# Patient Record
Sex: Female | Born: 1970
Health system: Southern US, Community
[De-identification: ages and names within clinical notes are randomized; demographics above are authoritative.]

## PROBLEM LIST (undated history)

## (undated) DIAGNOSIS — I10 Essential (primary) hypertension: Secondary | ICD-10-CM

## (undated) DIAGNOSIS — E669 Obesity, unspecified: Secondary | ICD-10-CM

## (undated) DIAGNOSIS — D649 Anemia, unspecified: Secondary | ICD-10-CM

## (undated) DIAGNOSIS — R9431 Abnormal electrocardiogram [ECG] [EKG]: Secondary | ICD-10-CM

## (undated) DIAGNOSIS — K219 Gastro-esophageal reflux disease without esophagitis: Secondary | ICD-10-CM

## (undated) DIAGNOSIS — F32A Depression, unspecified: Secondary | ICD-10-CM

## (undated) DIAGNOSIS — E559 Vitamin D deficiency, unspecified: Secondary | ICD-10-CM

## (undated) DIAGNOSIS — G43909 Migraine, unspecified, not intractable, without status migrainosus: Secondary | ICD-10-CM

## (undated) DIAGNOSIS — E785 Hyperlipidemia, unspecified: Secondary | ICD-10-CM

## (undated) DIAGNOSIS — E8881 Metabolic syndrome: Secondary | ICD-10-CM

## (undated) DIAGNOSIS — E119 Type 2 diabetes mellitus without complications: Secondary | ICD-10-CM

## (undated) DIAGNOSIS — A048 Other specified bacterial intestinal infections: Secondary | ICD-10-CM

## (undated) DIAGNOSIS — F329 Major depressive disorder, single episode, unspecified: Secondary | ICD-10-CM

## (undated) DIAGNOSIS — R946 Abnormal results of thyroid function studies: Secondary | ICD-10-CM

## (undated) DIAGNOSIS — A6000 Herpesviral infection of urogenital system, unspecified: Secondary | ICD-10-CM

## (undated) HISTORY — DX: Type 2 diabetes mellitus without complications: E11.9

## (undated) HISTORY — DX: Major depressive disorder, single episode, unspecified: F32.9

## (undated) HISTORY — DX: Gastro-esophageal reflux disease without esophagitis: K21.9

## (undated) HISTORY — DX: Metabolic syndrome: E88.81

## (undated) HISTORY — DX: Herpesviral infection of urogenital system, unspecified: A60.00

## (undated) HISTORY — DX: Metabolic syndrome: E88.810

## (undated) HISTORY — DX: Abnormal results of thyroid function studies: R94.6

## (undated) HISTORY — PX: TUBAL LIGATION: SHX77

## (undated) HISTORY — DX: Migraine, unspecified, not intractable, without status migrainosus: G43.909

## (undated) HISTORY — PX: ABDOMINAL HYSTERECTOMY: SHX81

## (undated) HISTORY — PX: ANKLE SURGERY: SHX546

## (undated) HISTORY — DX: Essential (primary) hypertension: I10

## (undated) HISTORY — DX: Abnormal electrocardiogram (ECG) (EKG): R94.31

## (undated) HISTORY — DX: Other specified bacterial intestinal infections: A04.8

## (undated) HISTORY — DX: Depression, unspecified: F32.A

## (undated) HISTORY — DX: Obesity, unspecified: E66.9

## (undated) HISTORY — DX: Hyperlipidemia, unspecified: E78.5

## (undated) HISTORY — DX: Vitamin D deficiency, unspecified: E55.9

## (undated) HISTORY — DX: Anemia, unspecified: D64.9

---

## 2004-08-08 ENCOUNTER — Emergency Department: Payer: Self-pay | Admitting: Emergency Medicine

## 2005-01-28 ENCOUNTER — Emergency Department: Payer: Self-pay | Admitting: Emergency Medicine

## 2005-04-16 ENCOUNTER — Emergency Department: Payer: Self-pay | Admitting: Emergency Medicine

## 2005-05-10 ENCOUNTER — Emergency Department: Payer: Self-pay | Admitting: Emergency Medicine

## 2005-05-29 ENCOUNTER — Emergency Department: Payer: Self-pay | Admitting: Emergency Medicine

## 2006-01-10 ENCOUNTER — Emergency Department: Payer: Self-pay | Admitting: Emergency Medicine

## 2006-01-23 ENCOUNTER — Emergency Department: Payer: Self-pay | Admitting: Internal Medicine

## 2006-03-05 ENCOUNTER — Emergency Department: Payer: Self-pay | Admitting: Emergency Medicine

## 2006-07-15 ENCOUNTER — Emergency Department: Payer: Self-pay

## 2007-05-21 ENCOUNTER — Ambulatory Visit: Payer: Self-pay | Admitting: Internal Medicine

## 2007-05-31 ENCOUNTER — Emergency Department: Payer: Self-pay | Admitting: Internal Medicine

## 2007-05-31 ENCOUNTER — Other Ambulatory Visit: Payer: Self-pay

## 2007-06-05 ENCOUNTER — Emergency Department: Payer: Self-pay | Admitting: Emergency Medicine

## 2007-06-05 ENCOUNTER — Other Ambulatory Visit: Payer: Self-pay

## 2007-06-07 ENCOUNTER — Ambulatory Visit: Payer: Self-pay | Admitting: Internal Medicine

## 2007-06-08 ENCOUNTER — Emergency Department: Payer: Self-pay | Admitting: Emergency Medicine

## 2007-06-20 ENCOUNTER — Ambulatory Visit: Payer: Self-pay | Admitting: Internal Medicine

## 2007-07-21 ENCOUNTER — Ambulatory Visit: Payer: Self-pay | Admitting: Internal Medicine

## 2007-08-20 ENCOUNTER — Ambulatory Visit: Payer: Self-pay | Admitting: Internal Medicine

## 2007-09-20 ENCOUNTER — Ambulatory Visit: Payer: Self-pay | Admitting: Internal Medicine

## 2007-10-21 ENCOUNTER — Ambulatory Visit: Payer: Self-pay | Admitting: Internal Medicine

## 2007-11-08 ENCOUNTER — Ambulatory Visit: Payer: Self-pay | Admitting: Obstetrics & Gynecology

## 2007-11-18 ENCOUNTER — Ambulatory Visit: Payer: Self-pay | Admitting: Internal Medicine

## 2007-11-20 ENCOUNTER — Ambulatory Visit: Payer: Self-pay | Admitting: Obstetrics & Gynecology

## 2007-12-19 ENCOUNTER — Ambulatory Visit: Payer: Self-pay | Admitting: Internal Medicine

## 2008-01-09 ENCOUNTER — Ambulatory Visit: Payer: Self-pay | Admitting: Obstetrics & Gynecology

## 2008-01-15 ENCOUNTER — Ambulatory Visit: Payer: Self-pay | Admitting: Obstetrics & Gynecology

## 2008-01-18 ENCOUNTER — Ambulatory Visit: Payer: Self-pay | Admitting: Internal Medicine

## 2008-02-01 ENCOUNTER — Ambulatory Visit: Payer: Self-pay | Admitting: Internal Medicine

## 2008-02-08 ENCOUNTER — Emergency Department: Payer: Self-pay | Admitting: Emergency Medicine

## 2008-02-18 ENCOUNTER — Ambulatory Visit: Payer: Self-pay | Admitting: Internal Medicine

## 2009-02-21 ENCOUNTER — Emergency Department: Payer: Self-pay | Admitting: Emergency Medicine

## 2009-03-24 ENCOUNTER — Ambulatory Visit: Payer: Self-pay | Admitting: Family Medicine

## 2009-05-18 ENCOUNTER — Emergency Department: Payer: Self-pay | Admitting: Emergency Medicine

## 2009-08-17 ENCOUNTER — Ambulatory Visit: Payer: Self-pay | Admitting: Family Medicine

## 2010-02-12 ENCOUNTER — Emergency Department: Payer: Self-pay | Admitting: Emergency Medicine

## 2010-02-14 ENCOUNTER — Emergency Department: Payer: Self-pay | Admitting: Emergency Medicine

## 2010-03-08 LAB — HM PAP SMEAR: HM Pap smear: NORMAL

## 2010-09-30 ENCOUNTER — Emergency Department: Payer: Self-pay | Admitting: Emergency Medicine

## 2011-03-11 ENCOUNTER — Ambulatory Visit: Payer: Self-pay | Admitting: Family Medicine

## 2011-04-09 ENCOUNTER — Emergency Department: Payer: Self-pay | Admitting: Emergency Medicine

## 2011-06-19 ENCOUNTER — Emergency Department: Payer: Self-pay | Admitting: *Deleted

## 2011-09-08 ENCOUNTER — Emergency Department: Payer: Self-pay | Admitting: Emergency Medicine

## 2011-12-29 ENCOUNTER — Observation Stay: Payer: Self-pay | Admitting: Internal Medicine

## 2011-12-29 DIAGNOSIS — R079 Chest pain, unspecified: Secondary | ICD-10-CM

## 2011-12-29 LAB — BASIC METABOLIC PANEL
Anion Gap: 9 (ref 7–16)
BUN: 10 mg/dL (ref 7–18)
EGFR (Non-African Amer.): >60
Glucose: 96 mg/dL (ref 65–99)
Osmolality: 276 (ref 275–301)

## 2011-12-29 LAB — CK TOTAL AND CKMB (NOT AT ARMC)
CK, Total: 102 U/L (ref 21–215)
CK, Total: 89 U/L (ref 21–215)
CK-MB: <0.5 ng/mL — ABNORMAL LOW (ref 0.5–3.6)
CK-MB: <0.5 ng/mL — ABNORMAL LOW (ref 0.5–3.6)

## 2011-12-29 LAB — TROPONIN I: Troponin-I: <0.02 ng/mL

## 2011-12-29 LAB — CBC
HGB: 13.2 g/dL (ref 12.0–16.0)
MCH: 29 pg (ref 26.0–34.0)
MCHC: 33.3 g/dL (ref 32.0–36.0)
MCV: 87 fL (ref 80–100)
Platelet: 284 10*3/uL (ref 150–440)
RBC: 4.55 10*6/uL (ref 3.80–5.20)
RDW: 14.8 % — ABNORMAL HIGH (ref 11.5–14.5)

## 2011-12-30 LAB — BASIC METABOLIC PANEL
Anion Gap: 7 (ref 7–16)
BUN: 15 mg/dL (ref 7–18)
Calcium, Total: 8.6 mg/dL (ref 8.5–10.1)
Chloride: 104 mmol/L (ref 98–107)
Co2: 27 mmol/L (ref 21–32)
EGFR (African American): >60
Glucose: 111 mg/dL — ABNORMAL HIGH (ref 65–99)
Potassium: 3.7 mmol/L (ref 3.5–5.1)
Sodium: 138 mmol/L (ref 136–145)

## 2011-12-30 LAB — LIPID PANEL: Triglycerides: 86 mg/dL (ref 0–200)

## 2011-12-30 LAB — HEMOGLOBIN A1C: Hemoglobin A1C: 5.9 % (ref 4.2–6.3)

## 2011-12-30 LAB — TSH: Thyroid Stimulating Horm: 2.61 u[IU]/mL

## 2012-06-10 ENCOUNTER — Emergency Department: Payer: Self-pay | Admitting: Emergency Medicine

## 2012-07-09 ENCOUNTER — Ambulatory Visit: Payer: 59 | Admitting: Cardiovascular Disease

## 2012-07-16 ENCOUNTER — Encounter: Payer: Self-pay | Admitting: Cardiovascular Disease

## 2012-10-02 ENCOUNTER — Emergency Department: Payer: Self-pay | Admitting: Emergency Medicine

## 2012-10-02 LAB — URINALYSIS, COMPLETE
Bacteria: NONE SEEN
Blood: NEGATIVE
Glucose,UR: NEGATIVE mg/dL (ref 0–75)
Nitrite: NEGATIVE
Ph: 6 (ref 4.5–8.0)
Protein: NEGATIVE
RBC,UR: <1 /HPF (ref 0–5)
Squamous Epithelial: 3

## 2012-10-02 LAB — BASIC METABOLIC PANEL
Anion Gap: 3 — ABNORMAL LOW (ref 7–16)
BUN: 13 mg/dL (ref 7–18)
Calcium, Total: 8.9 mg/dL (ref 8.5–10.1)
Chloride: 107 mmol/L (ref 98–107)
Co2: 29 mmol/L (ref 21–32)
Creatinine: 0.81 mg/dL (ref 0.60–1.30)
EGFR (African American): >60
EGFR (Non-African Amer.): >60
Glucose: 86 mg/dL (ref 65–99)
Osmolality: 277 (ref 275–301)
Potassium: 3.5 mmol/L (ref 3.5–5.1)
Sodium: 139 mmol/L (ref 136–145)

## 2012-10-11 ENCOUNTER — Emergency Department: Payer: Self-pay | Admitting: Emergency Medicine

## 2013-04-20 ENCOUNTER — Emergency Department: Payer: Self-pay | Admitting: Emergency Medicine

## 2013-05-27 ENCOUNTER — Emergency Department: Payer: Self-pay | Admitting: Emergency Medicine

## 2013-07-12 ENCOUNTER — Ambulatory Visit: Payer: Self-pay | Admitting: Family Medicine

## 2013-07-12 LAB — HM MAMMOGRAPHY: HM Mammogram: NORMAL

## 2013-08-23 ENCOUNTER — Ambulatory Visit: Payer: Self-pay | Admitting: Family Medicine

## 2014-01-22 ENCOUNTER — Encounter (INDEPENDENT_AMBULATORY_CARE_PROVIDER_SITE_OTHER): Payer: Self-pay

## 2014-01-22 ENCOUNTER — Encounter: Payer: Self-pay | Admitting: Cardiovascular Disease

## 2014-01-22 ENCOUNTER — Ambulatory Visit (INDEPENDENT_AMBULATORY_CARE_PROVIDER_SITE_OTHER): Payer: BC Managed Care – PPO | Admitting: Cardiovascular Disease

## 2014-01-22 VITALS — BP 182/110 | Ht 67.0 in | Wt 294.0 lb

## 2014-01-22 DIAGNOSIS — R079 Chest pain, unspecified: Secondary | ICD-10-CM | POA: Insufficient documentation

## 2014-01-22 DIAGNOSIS — R931 Abnormal findings on diagnostic imaging of heart and coronary circulation: Secondary | ICD-10-CM | POA: Insufficient documentation

## 2014-01-22 DIAGNOSIS — R9439 Abnormal result of other cardiovascular function study: Secondary | ICD-10-CM

## 2014-01-22 DIAGNOSIS — I10 Essential (primary) hypertension: Secondary | ICD-10-CM

## 2014-01-22 DIAGNOSIS — R0602 Shortness of breath: Secondary | ICD-10-CM | POA: Insufficient documentation

## 2014-01-22 DIAGNOSIS — E119 Type 2 diabetes mellitus without complications: Secondary | ICD-10-CM

## 2014-01-22 DIAGNOSIS — E785 Hyperlipidemia, unspecified: Secondary | ICD-10-CM

## 2014-01-22 DIAGNOSIS — E1169 Type 2 diabetes mellitus with other specified complication: Secondary | ICD-10-CM

## 2014-01-22 DIAGNOSIS — E669 Obesity, unspecified: Secondary | ICD-10-CM | POA: Insufficient documentation

## 2014-01-22 MED ORDER — CLONIDINE HCL 0.1 MG PO TABS: 0.1000 mg | ORAL_TABLET | Freq: Two times a day (BID) | ORAL | Status: DC

## 2014-01-22 NOTE — Patient Instructions (Signed)
You are doing well. Stay on the tribenzor Start clonidine one pill twice a day Stay on nadolol twice a day  If blood pressure continue to run high after one week, Increase the clonidine up to 0.2 mg twice a day  Please call us if you have new issues that need to be addressed before your next appt.  Your physician wants you to follow-up in: 2 weeks

## 2014-01-22 NOTE — Progress Notes (Signed)
Patient ID: Tiffany Nunez, female    DOB: 10-23-1970, 43 y.o.   MRN: [id number]  HPI Comments: [name] is a very pleasant 43 year old woman with obesity, hypertension, GERD, hyperlipidemia, diabetes type 2 who presents by referral for evaluation of hypertension.  Notes indicate history of prior stress test 12/29/2011 showing no ischemia. Ejection fraction at that time estimated at 36% She has not had an echocardiogram to confirm ejection fraction Reports that her sugars have been relatively well controlled. She is concerned about her poorly controlled hypertension. She was recently started on hydralazine 10 mg 3 times a day. She thinks that she will have trouble with a 3 times a day medication, forgetting doses.  In her blood pressure is high, she has headaches, sometimes these can lead to migraines When asked about chest pain as mentioned in the note from Dr. Ancil Boozer, she denies having further chest pain. She is nervous about her blood pressure and feels that this may contribute to symptoms.  She reports that higher dose amlodipine caused leg swelling. She continues to have mild leg swelling on amlodipine 5 mg daily No significant symptoms on at all. She has not noticed any symptoms so far on hydralazine but she is only been on this for one or 2 days.  EKG shows normal sinus rhythm with rate 63 beats per minute, no significant ST or T wave changes   Outpatient Encounter Prescriptions as of 01/22/2014  Medication Sig  . hydrALAZINE (APRESOLINE) 10 MG tablet Take 10 mg by mouth 3 (three) times daily.  Marland Kitchen ibuprofen (ADVIL,MOTRIN) 800 MG tablet Take 800 mg by mouth every 8 (eight) hours as needed.  . metFORMIN (GLUCOPHAGE) 500 MG tablet Take 500 mg by mouth daily with breakfast.  . nadolol (CORGARD) 40 MG tablet Take 40 mg by mouth 2 (two) times daily.  . Olmesartan-Amlodipine-HCTZ (TRIBENZOR) 40-5-25 MG TABS Take by mouth daily.  . ondansetron (ZOFRAN) 4 MG tablet Take 4 mg by mouth 2 (two)  times daily as needed for nausea or vomiting.     Review of Systems  Constitutional: Negative.   Eyes: Negative.   Respiratory: Positive for shortness of breath.   Cardiovascular: Negative.   Gastrointestinal: Negative.   Endocrine: Negative.   Musculoskeletal: Negative.   Skin: Negative.   Allergic/Immunologic: Negative.   Neurological: Positive for headaches.  Hematological: Negative.   Psychiatric/Behavioral: Negative.   All other systems reviewed and are negative.   BP 182/110  Ht 5\' 7"  (1.702 m)  Wt 294 lb (133.358 kg)  BMI 46.04 kg/m2  Physical Exam  Nursing note and vitals reviewed. Constitutional: She is oriented to person, place, and time. She appears well-developed and well-nourished.  HENT:  Head: Normocephalic.  Nose: Nose normal.  Mouth/Throat: Oropharynx is clear and moist.  Eyes: Conjunctivae are normal. Pupils are equal, round, and reactive to light.  Neck: Normal range of motion. Neck supple. No JVD present.  Cardiovascular: Normal rate, regular rhythm, S1 normal, S2 normal, normal heart sounds and intact distal pulses.  Exam reveals no gallop and no friction rub.   No murmur heard. Pulmonary/Chest: Effort normal and breath sounds normal. No respiratory distress. She has no wheezes. She has no rales. She exhibits no tenderness.  Abdominal: Soft. Bowel sounds are normal. She exhibits no distension. There is no tenderness.  Musculoskeletal: Normal range of motion. She exhibits no edema and no tenderness.  Lymphadenopathy:    She has no cervical adenopathy.  Neurological: She is alert and oriented to person, place,  and time. Coordination normal.  Skin: Skin is warm and dry. No rash noted. No erythema.  Psychiatric: She has a normal mood and affect. Her behavior is normal. Judgment and thought content normal.    Assessment and Plan

## 2014-01-22 NOTE — Assessment & Plan Note (Signed)
Mild shortness of breath likely from deconditioning, obesity, hypertension. May need echocardiogram to exclude cardio myopathy

## 2014-01-22 NOTE — Assessment & Plan Note (Signed)
Prior stress test in 2013 showing ejection fraction 36%. Suggested after blood pressure is well controlled, we consider routine echocardiogram to rule out cardiomyopathy. Sometimes low ejection fraction can be seen during stress testing which is a false reading.

## 2014-01-22 NOTE — Assessment & Plan Note (Signed)
Encouraged her to continue to work on her strict diet, and once blood pressure is controlled, we could start an exercise program

## 2014-01-22 NOTE — Assessment & Plan Note (Signed)
We spent most of her visit today talking about blood pressure and other options for medications. She is not particularly excited about a 3 times a day medication. Also having leg edema possibly from amlodipine. For now we have suggested she start clonidine 0.1 mg twice a day. We will hold the hydralazine and use this later if needed. If blood pressure is controlled, could try to wean the amlodipine to minimize leg edema. Perhaps dose could be decreased down to 2.5 mg daily. Clonidine dosing could be increased if tolerated, other options for blood pressure medication include Cardura, isosorbide mononitrate. She is followed by the Price of the tribenzor. In the future, perhaps these could be changed to generics (diovan/HCTZ) If blood pressure continues to be difficult to control, renal  artery ultrasound will be ordered.

## 2014-01-22 NOTE — Assessment & Plan Note (Signed)
She denies any further chest pains on today's visit. We will watch her closely. Testing could be ordered if she has additional symptoms.

## 2014-01-22 NOTE — Assessment & Plan Note (Signed)
We have encouraged continued exercise, careful diet management in an effort to lose weight. 

## 2014-01-30 ENCOUNTER — Ambulatory Visit: Payer: 59 | Admitting: Cardiovascular Disease

## 2014-01-31 ENCOUNTER — Telehealth: Payer: Self-pay

## 2014-01-31 NOTE — Telephone Encounter (Signed)
Spoke w/ pt.  She reports that her BP is not going down, staying around 150/100. States that she usually has ankle swelling, but this has always been relieved after elevating her feet. States that her feet have been swollen every morning. Reports that her SOB is improving and she is able to move around more. Advised her that per her instructions on her last ov, she was to increase her clonidine to 0.2mg  BID if BP remained elevated.  She is agreeable to this and will increase her dosage today.  Discussed w/ pt her diet.  She reports that she has been eating lots of soups and salads w/ ranch dressing. Advised her to follow a low sodium diet and see if this also helps w/ sx.  Pt will call w/ further questions or concerns.

## 2014-01-31 NOTE — Telephone Encounter (Signed)
Pt called, started taking Clonidine, states BP has not gone down over 150/100, ankles and staying swollen, not going down overnight.

## 2014-02-06 ENCOUNTER — Encounter: Payer: Self-pay | Admitting: Cardiovascular Disease

## 2014-02-06 ENCOUNTER — Ambulatory Visit (INDEPENDENT_AMBULATORY_CARE_PROVIDER_SITE_OTHER): Payer: BC Managed Care – PPO | Admitting: Cardiovascular Disease

## 2014-02-06 VITALS — BP 140/98 | HR 68 | Ht 67.0 in | Wt 295.2 lb

## 2014-02-06 DIAGNOSIS — E669 Obesity, unspecified: Secondary | ICD-10-CM

## 2014-02-06 DIAGNOSIS — I10 Essential (primary) hypertension: Secondary | ICD-10-CM

## 2014-02-06 DIAGNOSIS — E1169 Type 2 diabetes mellitus with other specified complication: Secondary | ICD-10-CM

## 2014-02-06 DIAGNOSIS — E119 Type 2 diabetes mellitus without complications: Secondary | ICD-10-CM

## 2014-02-06 MED ORDER — LOSARTAN POTASSIUM-HCTZ 100-25 MG PO TABS: 1.0000 | ORAL_TABLET | Freq: Every day | ORAL | Status: DC

## 2014-02-06 MED ORDER — CLONIDINE HCL 0.1 MG PO TABS: 0.1000 mg | ORAL_TABLET | Freq: Two times a day (BID) | ORAL | Status: DC

## 2014-02-06 MED ORDER — HYDRALAZINE HCL 100 MG PO TABS: 100.0000 mg | ORAL_TABLET | Freq: Three times a day (TID) | ORAL | Status: DC

## 2014-02-06 NOTE — Assessment & Plan Note (Signed)
We have encouraged continued exercise, careful diet management in an effort to lose weight. 

## 2014-02-06 NOTE — Assessment & Plan Note (Signed)
We have recommended she decrease the clonidine down to 0.1 mg twice a day secondary to side effects including dry mouth and fatigue. Given her lower extremity swelling which is getting worse, we have suggested she stop the tribenzor as I suspect the amlodipine could be contributing to symptoms. We will change her to losartan HCTZ 100/25 mg daily. We have recommended she start hydralazine 50 mg twice a day with titration up to 3 times a day. If needed 100 mg pills have been given and could be taken 3 times a day. Recommend she stay on her nadolol

## 2014-02-06 NOTE — Progress Notes (Signed)
Patient ID: Tiffany Nunez, female    DOB: 07-28-1971, 43 y.o.   MRN: [id number]  HPI Comments: Tiffany Nunez is a very pleasant 43 year old woman with obesity, hypertension, GERD, hyperlipidemia, diabetes type 2 who presents for followup of her blood pressure.  In followup today, she is very upset as her daughter was recently admitted to the psych ward transferred from [facility] to [place]. She reports recent stressors following the loss of family members, daughter has been very upset, at risk of hurting herself.   She reports having worsening lower extremity edema. Also worsening dry mouth and fatigue with higher dose clonidine. Blood pressure also continues to run high, 150s over 110 Otherwise she has been doing well She's not been taking any hydralazine No recent episodes of chest pain  Notes indicate history of prior stress test 12/29/2011 showing no ischemia. Ejection fraction at that time estimated at 36% She has not had an echocardiogram to confirm ejection fraction Reports that her sugars have been relatively well controlled.   EKG shows normal sinus rhythm with rate 63 beats per minute, no significant ST or T wave changes   Outpatient Encounter Prescriptions as of 02/06/2014  Medication Sig  . cloNIDine (CATAPRES) 0.1 MG tablet Take 0.2 mg by mouth 2 (two) times daily.  . hydrALAZINE (APRESOLINE) 10 MG tablet Take 10 mg by mouth 3 (three) times daily.  Marland Kitchen ibuprofen (ADVIL,MOTRIN) 800 MG tablet Take 800 mg by mouth every 8 (eight) hours as needed.  . metFORMIN (GLUCOPHAGE) 500 MG tablet Take 500 mg by mouth daily with breakfast.  . nadolol (CORGARD) 40 MG tablet Take 40 mg by mouth 2 (two) times daily.  . Olmesartan-Amlodipine-HCTZ (TRIBENZOR) 40-5-25 MG TABS Take by mouth daily.  . ondansetron (ZOFRAN) 4 MG tablet Take 4 mg by mouth 2 (two) times daily as needed for nausea or vomiting.     Review of Systems  Constitutional: Negative.   Eyes: Negative.   Cardiovascular:  Negative.   Gastrointestinal: Negative.   Endocrine: Negative.   Musculoskeletal: Negative.   Skin: Negative.   Allergic/Immunologic: Negative.   Hematological: Negative.   Psychiatric/Behavioral: Positive for dysphoric mood.  All other systems reviewed and are negative.   BP 140/98  Pulse 68  Ht 5\' 7"  (1.702 m)  Wt 295 lb 4 oz (133.925 kg)  BMI 46.23 kg/m2  Physical Exam  Nursing note and vitals reviewed. Constitutional: She is oriented to person, place, and time. She appears well-developed and well-nourished.  HENT:  Head: Normocephalic.  Nose: Nose normal.  Mouth/Throat: Oropharynx is clear and moist.  Eyes: Conjunctivae are normal. Pupils are equal, round, and reactive to light.  Neck: Normal range of motion. Neck supple. No JVD present.  Cardiovascular: Normal rate, regular rhythm, S1 normal, S2 normal, normal heart sounds and intact distal pulses.  Exam reveals no gallop and no friction rub.   No murmur heard. Pulmonary/Chest: Effort normal and breath sounds normal. No respiratory distress. She has no wheezes. She has no rales. She exhibits no tenderness.  Abdominal: Soft. Bowel sounds are normal. She exhibits no distension. There is no tenderness.  Musculoskeletal: Normal range of motion. She exhibits no edema and no tenderness.  Lymphadenopathy:    She has no cervical adenopathy.  Neurological: She is alert and oriented to person, place, and time. Coordination normal.  Skin: Skin is warm and dry. No rash noted. No erythema.  Psychiatric: She has a normal mood and affect. Her behavior is normal. Judgment and thought content normal.  Assessment and Plan

## 2014-02-06 NOTE — Patient Instructions (Addendum)
Go back on clonidine 0.1 mg twice a day  Stop  tribenzor, change to losartan HCTZ 100-25 mg daily Start hydralazine 1/2 pill (50 mg) two to three times a day OK to increase the hydralazine up to 10 mg as needed  Please call us if you have new issues that need to be addressed before your next appt.  Your physician wants you to follow-up in: 1 months.

## 2014-02-12 ENCOUNTER — Telehealth: Payer: Self-pay

## 2014-02-12 NOTE — Telephone Encounter (Signed)
Pt called back stating that she is so sick that she cannot go to work, dizzy and nauseous, w/ a "migraine that started the same day that I started that new medicine". Pt is concerned that she may be allergic to losartan.  She last took it this am.  States that her BP is still elevated 155/111. Pt is crying on the phone, worried that she may lose her job.  Pt has not spoken w/ her PCP about antianxiety meds. Advised pt to hold losartan until she hears back from [place], that Dr. Rockey Situ would review her chart and I will call her back w/ his recommendation.  She is agreeable to this and states that she will go to the ED if her sx worsen. Advised her to call her PCP and let her know what is going on, as pt is under a considerable amount of stress. She is agreeable and will wait to hear back from [place] about an alternative med for her BP.

## 2014-02-12 NOTE — Telephone Encounter (Signed)
Pt called, states she has had a migraine for the past 4 days, thinks it may be from Hydrolazine or Losartan. Please call.

## 2014-02-13 NOTE — Telephone Encounter (Signed)
If losartan causing side effects (would make sure it is not from other family stress) Hold the losartan HCTZ Would start diovan HCT 320/25 mg Start 1/2 pill per day Ok to hold hydralazine if she thinks this is causing headache Make sure not from stress  Call us with BP numbers

## 2014-02-14 MED ORDER — HYDROCHLOROTHIAZIDE 12.5 MG PO CAPS: 12.5000 mg | ORAL_CAPSULE | Freq: Every day | ORAL | Status: DC

## 2014-02-14 MED ORDER — LOSARTAN POTASSIUM 25 MG PO TABS: 25.0000 mg | ORAL_TABLET | Freq: Every day | ORAL | Status: DC

## 2014-02-14 NOTE — Telephone Encounter (Signed)
Spoke w/ pt.  She reports that she went to her PCP on Wed, her BP had dropped to 117/70 when she arrived there.  Her dosage of losartan was changed to 25mg , HCTZ to 12.5mg , split into 2 pills. She reports that her sx have resolved and she feels better. Advised her of Dr. Donivan Scull recommendation.  She will call if sx return and see about possibly switching to diovan, but she would like to try the lower dose losartan for now.

## 2014-03-13 ENCOUNTER — Ambulatory Visit: Payer: BC Managed Care – PPO | Admitting: Cardiovascular Disease

## 2014-04-14 ENCOUNTER — Ambulatory Visit: Payer: Self-pay | Admitting: Family Medicine

## 2014-04-30 DIAGNOSIS — G43009 Migraine without aura, not intractable, without status migrainosus: Secondary | ICD-10-CM | POA: Insufficient documentation

## 2014-07-23 ENCOUNTER — Emergency Department: Payer: Self-pay | Admitting: Student

## 2014-07-23 LAB — COMPREHENSIVE METABOLIC PANEL
ALK PHOS: 101 U/L
ANION GAP: 7 (ref 7–16)
Albumin: 3.4 g/dL (ref 3.4–5.0)
BILIRUBIN TOTAL: 0.5 mg/dL (ref 0.2–1.0)
BUN: 11 mg/dL (ref 7–18)
Calcium, Total: 8.5 mg/dL (ref 8.5–10.1)
Chloride: 107 mmol/L (ref 98–107)
Co2: 27 mmol/L (ref 21–32)
Creatinine: 0.96 mg/dL (ref 0.60–1.30)
EGFR (African American): >60
EGFR (Non-African Amer.): >60
GLUCOSE: 98 mg/dL (ref 65–99)
Osmolality: 281 (ref 275–301)
Potassium: 3.2 mmol/L — ABNORMAL LOW (ref 3.5–5.1)
SGOT(AST): 40 U/L — ABNORMAL HIGH (ref 15–37)
SGPT (ALT): 92 U/L — ABNORMAL HIGH
SODIUM: 141 mmol/L (ref 136–145)
Total Protein: 7.5 g/dL (ref 6.4–8.2)

## 2014-07-23 LAB — URINALYSIS, COMPLETE
BILIRUBIN, UR: NEGATIVE
Blood: NEGATIVE
Glucose,UR: NEGATIVE mg/dL (ref 0–75)
KETONE: NEGATIVE
LEUKOCYTE ESTERASE: NEGATIVE
NITRITE: NEGATIVE
PH: 6 (ref 4.5–8.0)
Protein: NEGATIVE
RBC, UR: NONE SEEN /HPF (ref 0–5)
Specific Gravity: 1.017 (ref 1.003–1.030)
Squamous Epithelial: 1

## 2014-07-23 LAB — CBC
HCT: 44.8 % (ref 35.0–47.0)
HGB: 14.6 g/dL (ref 12.0–16.0)
MCH: 29.6 pg (ref 26.0–34.0)
MCHC: 32.5 g/dL (ref 32.0–36.0)
MCV: 91 fL (ref 80–100)
Platelet: 300 10*3/uL (ref 150–440)
RBC: 4.92 10*6/uL (ref 3.80–5.20)
RDW: 13.9 % (ref 11.5–14.5)
WBC: 6.4 10*3/uL (ref 3.6–11.0)

## 2014-07-23 LAB — LIPASE, BLOOD: LIPASE: 85 U/L (ref 73–393)

## 2014-10-21 LAB — LIPID PANEL
Cholesterol: 120 mg/dL (ref 0–200)
HDL: 43 mg/dL (ref 35–70)
LDL Cholesterol: 62 mg/dL
TRIGLYCERIDES: 76 mg/dL (ref 40–160)

## 2014-10-21 LAB — HEMOGLOBIN A1C: Hgb A1c MFr Bld: 6.1 % — AB (ref 4.0–6.0)

## 2015-01-11 NOTE — Discharge Summary (Signed)
PATIENT NAME:  Tiffany Nunez, Tiffany Nunez MR#:  [id number] DATE OF BIRTH:  May 10, 1971  DATE OF ADMISSION:  12/29/2011 DATE OF DISCHARGE:  12/30/2011  DISCHARGE DIAGNOSES:  1. Atypical chest pain, likely noncardiac, unclear etiology.  2. Hypokalemia.  3. Hypomagnesemia.  4. Accelerated hypertension.  5. Hyperlipidemia.   DISPOSITION: The patient is being discharged to follow-up with primary care physician, Dr. Ancil Boozer, in 1 to 2 weeks after discharge. Recommended getting an outpatient 2-D echo.   ACTIVITY: As tolerated.   DIET: Low sodium.   DISCHARGE MEDICATIONS:  1. Hydralazine 25 mg t.i.d.  2. Spironolactone 25 mg daily.  3. Atenolol 100 mg daily.  4. Simvastatin 40 mg daily.  5. Lotrel 10/40 one tablet once a day. 6. Baclofen 10 mg as needed.  7. Zofran 4 mg ODT as needed.   RESULTS: Treadmill stress test showed no acute ischemia. EF was around 36%. CBC normal. Potassium 3.3 on admission, normal by the time of discharge. LDL 93. Low magnesium was supplemented. Cardiac enzymes negative. TSH 2.61.   HOSPITAL COURSE: The patient is a 44 year old female with past medical history of hypertension, history of iron deficiency due to DUB and migraines who presented with chest pain. Her chest pain was felt to be very atypical and likely noncardiac, however, given her risk factors she underwent an inpatient stress test which showed no ischemia. The patient had borderline low EF of 35% on the stress test. Dr. Rockey Situ felt that it was artifact but he recommended that the patient have an outpatient 2-D echo to confirm the EF on echo which is more accurate. The patient had mild electrolyte abnormalities including low potassium and magnesium which were supplemented. The patient had accelerated hypertension and she was on multiple antihypertensive medications. Hydralazine was added to her treatment regimen. The patient has history of hyperlipidemia. Her LDL is at goal.   DISPOSITION: The patient is being  discharged home in a stable condition.   TIME SPENT: 45 minutes.   ____________________________ Cherre Huger, MD sp:drc D: 12/30/2011 18:41:45 ET T: 01/02/2012 10:37:07 ET JOB#: [id number]  cc: Cherre Huger, MD, <Dictator> Bethena Roys. Ancil Boozer, MD Cherre Huger MD ELECTRONICALLY SIGNED 01/07/2012 7:18

## 2015-01-11 NOTE — H&P (Signed)
PATIENT NAME:  Tiffany Nunez MR#:  [id number] DATE OF BIRTH:  11/09/1970  DATE OF ADMISSION:  12/29/2011  REFERRING PHYSICIAN: Dr. Owens Nunez    PRIMARY CARE PHYSICIAN: Tiffany Sizer, MD    PRESENTING COMPLAINT: Chest pain.   HISTORY OF PRESENT ILLNESS: Tiffany Nunez is a 44 year old woman with history of hypertension, hyperlipidemia, and migraines who presents with complaints of developing chest pain. Reports that symptoms actually began two months ago. Tiffany symptoms occur on the left side with radiation down to Tiffany left arm associated with shortness of breath, palpitations, would occur at rest or on exertion. This evening she was driving to work when Tiffany symptoms recurred. Tiffany symptoms usually last around 20 minutes and would resolve if she rests and takes deep breaths. Currently is chest pain free. Denies any orthopnea or PND. No worsening lower extremity edema. No syncope. She endorses dizziness.   PAST MEDICAL HISTORY:  1. History of iron deficiency anemia in the setting of dysfunctional uterine bleeding.  2. Hypertension.  3. Migraines.  4. Hyperlipidemia.   PAST SURGICAL HISTORY:  1. Bilateral tubal ligation.  2. Dilation and curettage.  3. Hysterectomy.  4. Ankle surgery.   ALLERGIES: Imitrex.   MEDICATIONS: 1. Lotrel, unknown dose.  2. Spironolactone 25 mg daily.  3. Atenolol 100 mg daily.  4. Simvastatin 50 mg daily.  5. Ibuprofen as needed. 6. Baclofen as needed.  7. Zofran as needed.   FAMILY HISTORY: Hypertension. Tiffany Nunez had breast cancer.   SOCIAL HISTORY: She lives in [place] with Tiffany daughter. Denies any tobacco, alcohol, or drug use. She is a [employer].  REVIEW OF SYSTEMS: CONSTITUTIONAL: No fevers, nausea, or vomiting. EYES: No visual disturbance. ENT: No epistaxis, discharge. RESPIRATORY: No cough, hemoptysis. CARDIOVASCULAR: As per history of present illness. GI: No nausea, vomiting, abdominal pain, hematemesis, or melena. GU: No dysuria or  hematuria. ENDOCRINE: No polyuria or polydipsia. HEME: No easy bruising. SKIN: No ulcers. MUSCULOSKELETAL: No joint pain or swelling. NEUROLOGIC: No history of stroke or seizure. PSYCH: Denies any depression or suicidal ideation.   PHYSICAL EXAMINATION:   VITAL SIGNS: Temperature 99.4, pulse 93, respiratory rate 20, blood pressure 221/120, sating at 99% on room air.   GENERAL: Lying in bed in no apparent distress.   HEENT: Normocephalic, atraumatic. Pupils equal, symmetric, nonicteric. Nares without discharge. Moist mucous membrane.   NECK: Soft and supple. No adenopathy or JVP.   CARDIOVASCULAR: Non-tachy. No murmurs, rubs, or gallops.   LUNGS: Clear to auscultation bilaterally. No use of accessory muscles or increased respiratory effort.   ABDOMEN: Soft. Positive bowel sounds. No mass appreciated.   EXTREMITIES: No edema. Dorsal pedis pulses intact.   MUSCULOSKELETAL: No joint effusion.   SKIN: No ulcers.   NEUROLOGIC: No dysarthria or aphasia. Symmetrical strength. No focal deficits.   PSYCH: She is alert and oriented. The patient is cooperative.   PERTINENT LABS AND STUDIES: WBC 4.9, hemoglobin 13.2, hematocrit 39.7, platelets 284, MCV 87, glucose 96, BUN 10, creatinine 0.88, sodium 139, potassium 3.3, chloride 104, carbon dioxide 26, calcium 8.6. Troponin less than 0.02. CK 102. MB less than 0.5.   EKG with sinus rate of 92. No ST elevation or depression. No Q waves.   ASSESSMENT AND PLAN: Tiffany Nunez is a 44 year old woman with history of hypertension, hyperlipidemia, and migraines presenting with complaints of chest pain.  1. Chest pain with atypical and typical features. Admit to tele. Cycle cardiac enzymes. Obtain a treadmill stress test. Send TSH, fasting lipid panel, A1c  for risk stratification. Start Tiffany on aspirin. Restart atenolol and statin. Continue nitroglycerin sublingual as needed. Oxygen and morphine as needed.  2. Hypokalemia. Send mag level. Replace as needed.   3. Hypertension, accelerated. Start on low dose Lotrel until medication dosing verified. Will restart Tiffany atenolol.  4. Hyperlipidemia. Restart simvastatin.   5. Prophylaxis with aspirin and encourage p.o. intake and ambulation.   TIME SPENT: Approximately 45 minutes was spent patient care.   ____________________________ Tiffany Ohara, MD ap:drc D: 12/29/2011 05:13:42 ET T: 12/29/2011 09:31:47 ET JOB#: [id number]  cc: Brien Few Sander Speckman, MD, <Dictator> Bethena Roys. Ancil Boozer, MD Tiffany Ohara MD ELECTRONICALLY SIGNED 01/15/2012 1:56

## 2015-03-03 ENCOUNTER — Other Ambulatory Visit: Payer: Self-pay | Admitting: *Deleted

## 2015-03-03 MED ORDER — CLONIDINE HCL 0.1 MG PO TABS: 0.1000 mg | ORAL_TABLET | Freq: Two times a day (BID) | ORAL | Status: DC

## 2015-03-24 ENCOUNTER — Other Ambulatory Visit: Payer: Self-pay | Admitting: Family Medicine

## 2015-04-23 ENCOUNTER — Ambulatory Visit: Payer: Self-pay | Admitting: Family Medicine

## 2015-04-24 ENCOUNTER — Emergency Department: Payer: PRIVATE HEALTH INSURANCE

## 2015-04-24 ENCOUNTER — Emergency Department
Admission: EM | Admit: 2015-04-24 | Discharge: 2015-04-24 | Disposition: A | Discharge status: Home / Self Care | Payer: PRIVATE HEALTH INSURANCE | Attending: Emergency Medicine | Admitting: Emergency Medicine

## 2015-04-24 DIAGNOSIS — E119 Type 2 diabetes mellitus without complications: Secondary | ICD-10-CM | POA: Diagnosis not present

## 2015-04-24 DIAGNOSIS — R079 Chest pain, unspecified: Secondary | ICD-10-CM | POA: Insufficient documentation

## 2015-04-24 DIAGNOSIS — I10 Essential (primary) hypertension: Secondary | ICD-10-CM | POA: Insufficient documentation

## 2015-04-24 LAB — COMPREHENSIVE METABOLIC PANEL
ALBUMIN: 3.9 g/dL (ref 3.5–5.0)
ALK PHOS: 58 U/L (ref 38–126)
ALT: 21 U/L (ref 14–54)
AST: 17 U/L (ref 15–41)
Anion gap: 9 (ref 5–15)
BUN: 13 mg/dL (ref 6–20)
CALCIUM: 9.3 mg/dL (ref 8.9–10.3)
CO2: 28 mmol/L (ref 22–32)
Chloride: 102 mmol/L (ref 101–111)
Creatinine, Ser: 0.93 mg/dL (ref 0.44–1.00)
GFR calc Af Amer: >60 mL/min (ref >60)
Glucose, Bld: 113 mg/dL — ABNORMAL HIGH (ref 65–99)
Potassium: 3.2 mmol/L — ABNORMAL LOW (ref 3.5–5.1)
SODIUM: 139 mmol/L (ref 135–145)
Total Bilirubin: 0.4 mg/dL (ref 0.3–1.2)
Total Protein: 7.1 g/dL (ref 6.5–8.1)

## 2015-04-24 LAB — CBC
HCT: 40 % (ref 35.0–47.0)
Hemoglobin: 13.3 g/dL (ref 12.0–16.0)
MCH: 29.6 pg (ref 26.0–34.0)
MCHC: 33.4 g/dL (ref 32.0–36.0)
MCV: 88.8 fL (ref 80.0–100.0)
PLATELETS: 296 10*3/uL (ref 150–440)
RBC: 4.5 MIL/uL (ref 3.80–5.20)
RDW: 15 % — ABNORMAL HIGH (ref 11.5–14.5)
WBC: 6.7 10*3/uL (ref 3.6–11.0)

## 2015-04-24 LAB — TROPONIN I
Troponin I: <0.03 ng/mL (ref <0.031)
Troponin I: <0.03 ng/mL (ref <0.031)

## 2015-04-24 LAB — BRAIN NATRIURETIC PEPTIDE: B Natriuretic Peptide: 123 pg/mL — ABNORMAL HIGH (ref 0.0–100.0)

## 2015-04-24 NOTE — Discharge Instructions (Signed)

## 2015-04-24 NOTE — ED Notes (Signed)
Pt to ED c/o CP x 2 days.

## 2015-04-24 NOTE — ED Provider Notes (Signed)
[facility] Emergency Department Provider Note     Time seen: ----------------------------------------- 6:57 AM on 04/24/2015 -----------------------------------------    I have reviewed the triage vital signs and the nursing notes.   HISTORY  Chief Complaint Chest Pain    HPI Tiffany Nunez is a 44 y.o. female who presents to ER for chest pain. She has had this chest pain for 2 days. Patient states she has had high blood pressure that has caused this in the past. She states usually she gets a headache and chest starts hurting. This time she has not had a headache, just left-sided chest pain. Denies any injury. Denies any change in her medications. Denies fevers chills other complaints.   Past Medical History  Diagnosis Date  . Nonspecific abnormal results of thyroid function study   . Esophageal reflux   . Unspecified vitamin D deficiency   . Nonspecific abnormal electrocardiogram (ECG) (EKG)   . Helicobacter pylori (H. pylori)   . Obesity, unspecified   . Metabolic syndrome   . Genital herpes, unspecified   . Other and unspecified hyperlipidemia   . Migraine, unspecified, without mention of intractable migraine without mention of status migrainosus   . Essential hypertension, benign   . Depressive disorder   . Diabetes mellitus, type II   . Anemia     Patient Active Problem List   Diagnosis Date Noted  . HTN (hypertension) 01/22/2014  . SOB (shortness of breath) 01/22/2014  . Chest pain 01/22/2014  . Morbid obesity 01/22/2014  . Diabetes mellitus type 2 in obese 01/22/2014  . Hyperlipidemia 01/22/2014  . Decreased cardiac ejection fraction 01/22/2014    Past Surgical History  Procedure Laterality Date  . Tubal ligation    . Ankle surgery      right  . Abdominal hysterectomy      Allergies Imitrex  Social History History  Substance Use Topics  . Smoking status: Never Smoker   . Smokeless tobacco: Not on file  . Alcohol  Use: No    Review of Systems Constitutional: Negative for fever. Eyes: Negative for visual changes. ENT: Negative for sore throat. Cardiovascular: Positive for chest pain Respiratory: Negative for shortness of breath. Gastrointestinal: Negative for abdominal pain, vomiting and diarrhea. Genitourinary: Negative for dysuria. Musculoskeletal: Negative for back pain. Skin: Negative for rash. Neurological: Negative for headaches, focal weakness or numbness.  10-point ROS otherwise negative.  ____________________________________________   PHYSICAL EXAM:  VITAL SIGNS: ED Triage Vitals  Enc Vitals Group     BP 04/24/15 0649 137/84 mmHg     Pulse Rate 04/24/15 0649 64     Resp --      Temp 04/24/15 0649 98.3 F (36.8 C)     Temp Source 04/24/15 0649 Oral     SpO2 04/24/15 0649 92 %     Weight 04/24/15 0649 280 lb (127.007 kg)     Height 04/24/15 0649 5\' 7"  (1.702 m)     Head Cir --      Peak Flow --      Pain Score --      Pain Loc --      Pain Edu? --      Excl. in [place]? --     Constitutional: Alert and oriented. Well appearing and in no distress. Eyes: Conjunctivae are normal. PERRL. Normal extraocular movements. ENT   Head: Normocephalic and atraumatic.   Nose: No congestion/rhinnorhea.   Mouth/Throat: Mucous membranes are moist.   Neck: No stridor. Cardiovascular: Normal rate,  regular rhythm. Normal and symmetric distal pulses are present in all extremities. No murmurs, rubs, or gallops. Respiratory: Normal respiratory effort without tachypnea nor retractions. Breath sounds are clear and equal bilaterally. No wheezes/rales/rhonchi. Gastrointestinal: Soft and nontender. No distention. No abdominal bruits.  Musculoskeletal: Nontender with normal range of motion in all extremities. No joint effusions.  No lower extremity tenderness nor edema. Neurologic:  Normal speech and language. No gross focal neurologic deficits are appreciated. Speech is normal. No gait  instability. Skin:  Skin is warm, dry and intact. No rash noted. Psychiatric: Mood and affect are normal. Speech and behavior are normal. Patient exhibits appropriate insight and judgment. ____________________________________________  EKG: Interpreted by me. Normal sinus rhythm with normal axis normal intervals no evidence of hypertrophy or acute infarction.  ____________________________________________  ED COURSE:  Pertinent labs & imaging results that were available during my care of the patient were reviewed by me and considered in my medical decision making (see chart for details). Patient any cardiac labs, reevaluation. Possible repeat troponin ____________________________________________    LABS (pertinent positives/negatives)  Labs Reviewed  CBC - Abnormal; Notable for the following:    RDW 15.0 (*)    All other components within normal limits  COMPREHENSIVE METABOLIC PANEL - Abnormal; Notable for the following:    Potassium 3.2 (*)    Glucose, Bld 113 (*)    All other components within normal limits  BRAIN NATRIURETIC PEPTIDE - Abnormal; Notable for the following:    B Natriuretic Peptide 123.0 (*)    All other components within normal limits  TROPONIN I  TROPONIN I    RADIOLOGY Images were viewed by me  Chest x-ray  IMPRESSION: Increased interstitial markings in both lungs from prior studies, nonspecific but favor acute viral/atypical respiratory infection. ____________________________________________  FINAL ASSESSMENT AND PLAN  Chest pain  Plan: Patient with labs and imaging as dictated above. Patient's heart score is low risk despite her medical history. Troponin is negative 2 here. She'll be referred to her cardiologist for outpatient stress testing   Earleen Newport, MD   Earleen Newport, MD 04/24/15 [phone]

## 2015-04-24 NOTE — ED Notes (Signed)
Discharge as well as follow up instructions reviewed with patient who verbalized understanding.

## 2015-05-03 ENCOUNTER — Encounter: Payer: Self-pay | Admitting: Family Medicine

## 2015-05-03 DIAGNOSIS — E559 Vitamin D deficiency, unspecified: Secondary | ICD-10-CM | POA: Insufficient documentation

## 2015-05-03 DIAGNOSIS — F32A Depression, unspecified: Secondary | ICD-10-CM | POA: Insufficient documentation

## 2015-05-03 DIAGNOSIS — A6000 Herpesviral infection of urogenital system, unspecified: Secondary | ICD-10-CM | POA: Insufficient documentation

## 2015-05-03 DIAGNOSIS — K219 Gastro-esophageal reflux disease without esophagitis: Secondary | ICD-10-CM | POA: Insufficient documentation

## 2015-05-03 DIAGNOSIS — E785 Hyperlipidemia, unspecified: Secondary | ICD-10-CM | POA: Insufficient documentation

## 2015-05-03 DIAGNOSIS — M5430 Sciatica, unspecified side: Secondary | ICD-10-CM | POA: Insufficient documentation

## 2015-05-03 DIAGNOSIS — E8881 Metabolic syndrome: Secondary | ICD-10-CM | POA: Insufficient documentation

## 2015-05-03 DIAGNOSIS — Z862 Personal history of diseases of the blood and blood-forming organs and certain disorders involving the immune mechanism: Secondary | ICD-10-CM | POA: Insufficient documentation

## 2015-05-03 DIAGNOSIS — A048 Other specified bacterial intestinal infections: Secondary | ICD-10-CM | POA: Insufficient documentation

## 2015-05-03 DIAGNOSIS — L659 Nonscarring hair loss, unspecified: Secondary | ICD-10-CM | POA: Insufficient documentation

## 2015-05-03 DIAGNOSIS — F329 Major depressive disorder, single episode, unspecified: Secondary | ICD-10-CM | POA: Insufficient documentation

## 2015-05-07 ENCOUNTER — Encounter: Payer: Self-pay | Admitting: Family Medicine

## 2015-05-07 ENCOUNTER — Ambulatory Visit (INDEPENDENT_AMBULATORY_CARE_PROVIDER_SITE_OTHER): Payer: PRIVATE HEALTH INSURANCE | Admitting: Family Medicine

## 2015-05-07 VITALS — BP 130/84 | HR 67 | Temp 98.1°F | Resp 18 | Ht 67.0 in | Wt 272.1 lb

## 2015-05-07 DIAGNOSIS — E1169 Type 2 diabetes mellitus with other specified complication: Secondary | ICD-10-CM

## 2015-05-07 DIAGNOSIS — Z23 Encounter for immunization: Secondary | ICD-10-CM | POA: Diagnosis not present

## 2015-05-07 DIAGNOSIS — G43009 Migraine without aura, not intractable, without status migrainosus: Secondary | ICD-10-CM | POA: Diagnosis not present

## 2015-05-07 DIAGNOSIS — I10 Essential (primary) hypertension: Secondary | ICD-10-CM

## 2015-05-07 DIAGNOSIS — Z114 Encounter for screening for human immunodeficiency virus [HIV]: Secondary | ICD-10-CM | POA: Diagnosis not present

## 2015-05-07 DIAGNOSIS — E119 Type 2 diabetes mellitus without complications: Secondary | ICD-10-CM | POA: Diagnosis not present

## 2015-05-07 DIAGNOSIS — E669 Obesity, unspecified: Secondary | ICD-10-CM

## 2015-05-07 DIAGNOSIS — E876 Hypokalemia: Secondary | ICD-10-CM

## 2015-05-07 DIAGNOSIS — E559 Vitamin D deficiency, unspecified: Secondary | ICD-10-CM

## 2015-05-07 DIAGNOSIS — E785 Hyperlipidemia, unspecified: Secondary | ICD-10-CM | POA: Diagnosis not present

## 2015-05-07 LAB — POCT GLYCOSYLATED HEMOGLOBIN (HGB A1C): HEMOGLOBIN A1C: 5.8

## 2015-05-07 MED ORDER — LOSARTAN POTASSIUM-HCTZ 50-12.5 MG PO TABS: 1.0000 | ORAL_TABLET | Freq: Every day | ORAL | Status: DC

## 2015-05-07 MED ORDER — METFORMIN HCL ER 500 MG PO TB24: 500.0000 mg | ORAL_TABLET | Freq: Every evening | ORAL | Status: DC

## 2015-05-07 MED ORDER — CLONIDINE HCL 0.2 MG PO TABS: 0.2000 mg | ORAL_TABLET | Freq: Every day | ORAL | Status: DC

## 2015-05-07 MED ORDER — ONDANSETRON 4 MG PO TBDP: 4.0000 mg | ORAL_TABLET | Freq: Two times a day (BID) | ORAL | Status: DC | PRN

## 2015-05-07 MED ORDER — NADOLOL 40 MG PO TABS: 40.0000 mg | ORAL_TABLET | Freq: Two times a day (BID) | ORAL | Status: DC

## 2015-05-07 MED ORDER — BUTALBITAL-APAP-CAFFEINE 50-300-40 MG PO CAPS: 1.0000 | ORAL_CAPSULE | ORAL | Status: DC | PRN

## 2015-05-07 MED ORDER — IBUPROFEN 800 MG PO TABS: 800.0000 mg | ORAL_TABLET | ORAL | Status: DC | PRN

## 2015-05-07 MED ORDER — HYDRALAZINE HCL 50 MG PO TABS: 50.0000 mg | ORAL_TABLET | Freq: Two times a day (BID) | ORAL | Status: DC

## 2015-05-07 NOTE — Progress Notes (Signed)
Name: Tiffany Nunez   MRN: [id number]    DOB: 1971/02/18   Date:05/07/2015       Progress Note  Subjective  Chief Complaint  Chief Complaint  Patient presents with  . Medication Refill    3 month F/U  . Diabetes    Checks BS twice weekly, Low-90 Average-100 High-180  . Hypertension    Well Controlled, Checks BP at home and get's 130/90  . Migraine    Having them twice weekly, due to stress    HPI  DMII : checking fsbs at home and is usually at goal, occasionally goes up when she does not follow a diabetic diet. She has been eating healthier, trying to lose weight . Denies polyphagia, polydipsia or polyuria.   HTN: doing well now, chest pain and SOB resolved, seen by Dr. Hedy Camara and is taking medication , however taking two clonidine pills at night instead of in am because of sedation.   Migraine headaches: episodes are still two to three times weekly, she is able to work when she has an episode, takes medication prn only, Nadolol for prevention Occasionally misses work when severe symptoms that do not improve with medication. Migraine is described as frontal pain, throbbing like, associated with photophobia and nausea, sometimes vomiting.   Obesity: changed her diet, she is more active at work, but not really following an exercise regiment at home.   Patient Active Problem List   Diagnosis Date Noted  . Vitamin D deficiency 05/03/2015  . Dyslipidemia 05/03/2015  . Gastro-esophageal reflux disease without esophagitis 05/03/2015  . Alopecia 05/03/2015  . Genital herpes 05/03/2015  . Dysmetabolic syndrome 38/25/0539  . Positive H. pylori test 05/03/2015  . Neuralgia neuritis, sciatic nerve 05/03/2015  . Migraine without aura and responsive to treatment 04/30/2014  . HTN (hypertension) 01/22/2014  . Morbid obesity 01/22/2014  . Diabetes mellitus type 2 in obese 01/22/2014  . Decreased cardiac ejection fraction 01/22/2014    Past Surgical History  Procedure Laterality Date   . Tubal ligation    . Ankle surgery      right  . Abdominal hysterectomy      Family History  Problem Relation Age of Onset  . Hypertension Father   . Hyperlipidemia Father   . Hyperlipidemia Mother   . Hypertension Mother   . Hypertension Paternal Aunt   . Cancer Maternal Grandmother     breast    Social History   Social History  . Marital Status: Divorced    Spouse Name: N/A  . Number of Children: N/A  . Years of Education: N/A   Occupational History  . Not on file.   Social History Main Topics  . Smoking status: Never Smoker   . Smokeless tobacco: Never Used  . Alcohol Use: No  . Drug Use: No  . Sexual Activity: No   Other Topics Concern  . Not on file   Social History Narrative     Current outpatient prescriptions:  .  Butalbital-APAP-Caffeine 50-300-40 MG CAPS, Take 1 capsule by mouth as needed., Disp: 60 capsule, Rfl: 0 .  hydrALAZINE (APRESOLINE) 50 MG tablet, Take 1 tablet (50 mg total) by mouth 2 (two) times daily., Disp: 180 tablet, Rfl: 1 .  ibuprofen (ADVIL,MOTRIN) 800 MG tablet, Take 1 tablet (800 mg total) by mouth as needed., Disp: 90 tablet, Rfl: 0 .  metFORMIN (GLUCOPHAGE-XR) 500 MG 24 hr tablet, Take 1 tablet (500 mg total) by mouth every evening., Disp: 90 tablet, Rfl: 1 .  nadolol (CORGARD) 40 MG tablet, Take 1 tablet (40 mg total) by mouth 2 (two) times daily., Disp: 180 tablet, Rfl: 1 .  ondansetron (ZOFRAN-ODT) 4 MG disintegrating tablet, Take 1 tablet (4 mg total) by mouth 2 (two) times daily as needed., Disp: 30 tablet, Rfl: 0 .  cloNIDine (CATAPRES) 0.2 MG tablet, Take 1 tablet (0.2 mg total) by mouth at bedtime., Disp: 90 tablet, Rfl: 1 .  losartan-hydrochlorothiazide (HYZAAR) 50-12.5 MG per tablet, Take 1 tablet by mouth daily., Disp: 90 tablet, Rfl: 1  Allergies  Allergen Reactions  . Imitrex [Sumatriptan] Other (See Comments)    Reaction: Unknown     ROS  Constitutional: Negative for fever or significant weight change.   Respiratory: Negative for cough and shortness of breath.   Cardiovascular: Negative for chest pain or palpitations.  Gastrointestinal: Negative for abdominal pain, no bowel changes.  Musculoskeletal: Negative for gait problem or joint swelling.  Skin: Negative for rash.  Neurological: Negative for dizziness or headache.  No other specific complaints in a complete review of systems (except as listed in HPI above).  Objective  Filed Vitals:   05/07/15 1022  BP: 130/84  Pulse: 67  Temp: 98.1 F (36.7 C)  TempSrc: Oral  Resp: 18  Height: 5' 7" (1.702 m)  Weight: 272 lb 1.6 oz (123.424 kg)  SpO2: 97%    Body mass index is 42.61 kg/(m^2).  Physical Exam  Constitutional: Patient appears well-developed and well-nourished. Obese No distress.  HEENT: head atraumatic, normocephalic, pupils equal and reactive to light,  neck supple, throat within normal limits Cardiovascular: Normal rate, regular rhythm and normal heart sounds.  No murmur heard. No BLE edema. Pulmonary/Chest: Effort normal and breath sounds normal. No respiratory distress. Abdominal: Soft.  There is no tenderness. Psychiatric: Patient has a normal mood and affect. behavior is normal. Judgment and thought content normal.  Recent Results (from the past 2160 hour(s))  CBC     Status: Abnormal   Collection Time: 04/24/15  7:09 AM  Result Value Ref Range   WBC 6.7 3.6 - 11.0 K/uL   RBC 4.50 3.80 - 5.20 MIL/uL   Hemoglobin 13.3 12.0 - 16.0 g/dL   HCT 40.0 35.0 - 47.0 %   MCV 88.8 80.0 - 100.0 fL   MCH 29.6 26.0 - 34.0 pg   MCHC 33.4 32.0 - 36.0 g/dL   RDW 15.0 (H) 11.5 - 14.5 %   Platelets 296 150 - 440 K/uL  Troponin I     Status: None   Collection Time: 04/24/15  7:09 AM  Result Value Ref Range   Troponin I <0.03 <0.031 ng/mL    Comment:        NO INDICATION OF MYOCARDIAL INJURY.   Comprehensive metabolic panel     Status: Abnormal   Collection Time: 04/24/15  7:09 AM  Result Value Ref Range   Sodium 139  135 - 145 mmol/L   Potassium 3.2 (L) 3.5 - 5.1 mmol/L   Chloride 102 101 - 111 mmol/L   CO2 28 22 - 32 mmol/L   Glucose, Bld 113 (H) 65 - 99 mg/dL   BUN 13 6 - 20 mg/dL   Creatinine, Ser 0.93 0.44 - 1.00 mg/dL   Calcium 9.3 8.9 - 10.3 mg/dL   Total Protein 7.1 6.5 - 8.1 g/dL   Albumin 3.9 3.5 - 5.0 g/dL   AST 17 15 - 41 U/L   ALT 21 14 - 54 U/L   Alkaline Phosphatase 58 38 -  126 U/L   Total Bilirubin 0.4 0.3 - 1.2 mg/dL   GFR calc non Af Amer >60 >60 mL/min   GFR calc Af Amer >60 >60 mL/min    Comment: (NOTE) The eGFR has been calculated using the CKD EPI equation. This calculation has not been validated in all clinical situations. eGFR's persistently <60 mL/min signify possible Chronic Kidney Disease.    Anion gap 9 5 - 15  Brain natriuretic peptide     Status: Abnormal   Collection Time: 04/24/15  7:09 AM  Result Value Ref Range   B Natriuretic Peptide 123.0 (H) 0.0 - 100.0 pg/mL  Troponin I     Status: None   Collection Time: 04/24/15  9:11 AM  Result Value Ref Range   Troponin I <0.03 <0.031 ng/mL    Comment:        NO INDICATION OF MYOCARDIAL INJURY.   POCT HgB A1C     Status: None   Collection Time: 05/07/15 10:47 AM  Result Value Ref Range   Hemoglobin A1C 5.8     Diabetic Foot Exam: Diabetic Foot Exam - Simple   Simple Foot Form  Visual Inspection  No deformities, no ulcerations, no other skin breakdown bilaterally:  Yes  Sensation Testing  Intact to touch and monofilament testing bilaterally:  Yes  Pulse Check  Posterior Tibialis and Dorsalis pulse intact bilaterally:  Yes  Comments       PHQ2/9: Depression screen PHQ 2/9 05/07/2015  Decreased Interest 0  Down, Depressed, Hopeless 0  PHQ - 2 Score 0     Fall Risk: Fall Risk  05/07/2015  Falls in the past year? No    Assessment & Plan  1. Diabetes mellitus type 2 in obese hgbA1C is decreasing, but she does not want to stop medication because it helps her lose weight - POCT HgB A1C -  metFORMIN (GLUCOPHAGE-XR) 500 MG 24 hr tablet; Take 1 tablet (500 mg total) by mouth every evening.  Dispense: 90 tablet; Refill: 1  2. Morbid obesity Discussed with the patient the risk posed by an increased BMI. Discussed importance of portion control, calorie counting and at least 150 minutes of physical activity weekly. Avoid sweet beverages and drink more water. Eat at least 6 servings of fruit and vegetables daily   3. Essential hypertension Changed clonidine to 0.58m qhs - losartan-hydrochlorothiazide (HYZAAR) 50-12.5 MG per tablet; Take 1 tablet by mouth daily.  Dispense: 90 tablet; Refill: 1 - hydrALAZINE (APRESOLINE) 50 MG tablet; Take 1 tablet (50 mg total) by mouth 2 (two) times daily.  Dispense: 180 tablet; Refill: 1 - nadolol (CORGARD) 40 MG tablet; Take 1 tablet (40 mg total) by mouth 2 (two) times daily.  Dispense: 180 tablet; Refill: 1 - cloNIDine (CATAPRES) 0.2 MG tablet; Take 1 tablet (0.2 mg total) by mouth at bedtime.  Dispense: 90 tablet; Refill: 1 - Comprehensive metabolic panel  4. Migraine without aura and responsive to treatment Not controlled, seen by Neurologist, she does not want to go back at this time - ibuprofen (ADVIL,MOTRIN) 800 MG tablet; Take 1 tablet (800 mg total) by mouth as needed.  Dispense: 90 tablet; Refill: 0 - ondansetron (ZOFRAN-ODT) 4 MG disintegrating tablet; Take 1 tablet (4 mg total) by mouth 2 (two) times daily as needed.  Dispense: 30 tablet; Refill: 0 - Butalbital-APAP-Caffeine 50-300-40 MG CAPS; Take 1 capsule by mouth as needed.  Dispense: 60 capsule; Refill: 0  5. Dyslipidemia  - Lipid panel  6. Vitamin D deficiency  -  Vit D  25 hydroxy (rtn osteoporosis monitoring)  7. Needs flu shot  - Flu Vaccine QUAD 36+ mos IM  8. Encounter for screening for HIV  - HIV antibody   9. Hypokalemia  - Potassium

## 2015-05-18 ENCOUNTER — Encounter: Payer: Self-pay | Admitting: Family Medicine

## 2015-05-18 ENCOUNTER — Ambulatory Visit (INDEPENDENT_AMBULATORY_CARE_PROVIDER_SITE_OTHER): Payer: PRIVATE HEALTH INSURANCE | Admitting: Family Medicine

## 2015-05-18 VITALS — BP 146/96 | HR 68 | Temp 98.4°F | Resp 16 | Ht 67.0 in | Wt 272.5 lb

## 2015-05-18 DIAGNOSIS — G43111 Migraine with aura, intractable, with status migrainosus: Secondary | ICD-10-CM

## 2015-05-18 MED ORDER — KETOROLAC TROMETHAMINE 60 MG/2ML IM SOLN
60.0000 mg | Freq: Once | INTRAMUSCULAR | Status: AC
  Administered 2015-05-18: 60 mg via INTRAMUSCULAR

## 2015-05-18 MED ORDER — PROMETHAZINE HCL 25 MG/ML IJ SOLN
25.0000 mg | Freq: Once | INTRAMUSCULAR | Status: AC
Start: 2015-05-18 — End: 2015-05-18
  Administered 2015-05-18: 25 mg via INTRAMUSCULAR

## 2015-05-18 NOTE — Progress Notes (Signed)
Name: Tiffany Nunez   MRN: [id number]    DOB: 20-May-1971   Date:05/18/2015       Progress Note  Subjective  Chief Complaint  Chief Complaint  Patient presents with  . Migraine    Started Yesterday morning, patient states she woke up with it and unable to get rid of migraine. Patient has taken her Fiorcet, Ibuprofen and Zofran for the nausea but nothing seems to be working.    HPI  Migraine with Aura: she usually can abort an episode during aura, but she woke up with wiggly lines on right visual filed and already had a migraine, visual symptoms have resolved. Pain right now is 9/10, still having nausea, no vomiting. Took her medication but still has symptoms. She missed work today because of it. No neuro deficit.   Patient Active Problem List   Diagnosis Date Noted  . Vitamin D deficiency 05/03/2015  . Dyslipidemia 05/03/2015  . Gastro-esophageal reflux disease without esophagitis 05/03/2015  . Alopecia 05/03/2015  . Genital herpes 05/03/2015  . Dysmetabolic syndrome 51/70/0174  . Positive H. pylori test 05/03/2015  . Neuralgia neuritis, sciatic nerve 05/03/2015  . Migraine without aura and responsive to treatment 04/30/2014  . HTN (hypertension) 01/22/2014  . Morbid obesity 01/22/2014  . Diabetes mellitus type 2 in obese 01/22/2014  . Decreased cardiac ejection fraction 01/22/2014    Past Surgical History  Procedure Laterality Date  . Tubal ligation    . Ankle surgery      right  . Abdominal hysterectomy      Family History  Problem Relation Age of Onset  . Hypertension Father   . Hyperlipidemia Father   . Hyperlipidemia Mother   . Hypertension Mother   . Hypertension Paternal Aunt   . Cancer Maternal Grandmother     breast    Social History   Social History  . Marital Status: Divorced    Spouse Name: N/A  . Number of Children: N/A  . Years of Education: N/A   Occupational History  . Not on file.   Social History Main Topics  . Smoking status: Never  Smoker   . Smokeless tobacco: Never Used  . Alcohol Use: No  . Drug Use: No  . Sexual Activity: No   Other Topics Concern  . Not on file   Social History Narrative     Current outpatient prescriptions:  .  Butalbital-APAP-Caffeine 50-300-40 MG CAPS, Take 1 capsule by mouth as needed., Disp: 60 capsule, Rfl: 0 .  cloNIDine (CATAPRES) 0.2 MG tablet, Take 1 tablet (0.2 mg total) by mouth at bedtime., Disp: 90 tablet, Rfl: 1 .  hydrALAZINE (APRESOLINE) 50 MG tablet, Take 1 tablet (50 mg total) by mouth 2 (two) times daily., Disp: 180 tablet, Rfl: 1 .  ibuprofen (ADVIL,MOTRIN) 800 MG tablet, Take 1 tablet (800 mg total) by mouth as needed., Disp: 90 tablet, Rfl: 0 .  losartan-hydrochlorothiazide (HYZAAR) 50-12.5 MG per tablet, Take 1 tablet by mouth daily., Disp: 90 tablet, Rfl: 1 .  metFORMIN (GLUCOPHAGE-XR) 500 MG 24 hr tablet, Take 1 tablet (500 mg total) by mouth every evening., Disp: 90 tablet, Rfl: 1 .  nadolol (CORGARD) 40 MG tablet, Take 1 tablet (40 mg total) by mouth 2 (two) times daily., Disp: 180 tablet, Rfl: 1 .  ondansetron (ZOFRAN-ODT) 4 MG disintegrating tablet, Take 1 tablet (4 mg total) by mouth 2 (two) times daily as needed., Disp: 30 tablet, Rfl: 0  Allergies  Allergen Reactions  . Imitrex [Sumatriptan] Other (  See Comments)    Reaction: Unknown     ROS  Ten systems reviewed and is negative except as mentioned in HPI   Objective  Filed Vitals:   05/18/15 1458  BP: 146/96  Pulse: 68  Temp: 98.4 F (36.9 C)  TempSrc: Oral  Resp: 16  Height: 5' 7" (1.702 m)  Weight: 272 lb 8 oz (123.605 kg)  SpO2: 98%    Body mass index is 42.67 kg/(m^2).  Physical Exam Constitutional: Patient appears well-developed and well-nourished. Obese  HEENT: head atraumatic, normocephalic, pupils equal and reactive to light, neck supple, throat within normal limits Cardiovascular: Normal rate, regular rhythm and normal heart sounds.  No murmur heard. No BLE  edema. Pulmonary/Chest: Effort normal and breath sounds normal. No respiratory distress. Abdominal: Soft.  There is no tenderness. Psychiatric: Patient has a normal mood and affect. behavior is normal. Judgment and thought content normal. Neuro: cranial nerves intact, normal sensation, normal patellar reflex, Romberg negative  Recent Results (from the past 2160 hour(s))  CBC     Status: Abnormal   Collection Time: 04/24/15  7:09 AM  Result Value Ref Range   WBC 6.7 3.6 - 11.0 K/uL   RBC 4.50 3.80 - 5.20 MIL/uL   Hemoglobin 13.3 12.0 - 16.0 g/dL   HCT 40.0 35.0 - 47.0 %   MCV 88.8 80.0 - 100.0 fL   MCH 29.6 26.0 - 34.0 pg   MCHC 33.4 32.0 - 36.0 g/dL   RDW 15.0 (H) 11.5 - 14.5 %   Platelets 296 150 - 440 K/uL  Troponin I     Status: None   Collection Time: 04/24/15  7:09 AM  Result Value Ref Range   Troponin I <0.03 <0.031 ng/mL    Comment:        NO INDICATION OF MYOCARDIAL INJURY.   Comprehensive metabolic panel     Status: Abnormal   Collection Time: 04/24/15  7:09 AM  Result Value Ref Range   Sodium 139 135 - 145 mmol/L   Potassium 3.2 (L) 3.5 - 5.1 mmol/L   Chloride 102 101 - 111 mmol/L   CO2 28 22 - 32 mmol/L   Glucose, Bld 113 (H) 65 - 99 mg/dL   BUN 13 6 - 20 mg/dL   Creatinine, Ser 0.93 0.44 - 1.00 mg/dL   Calcium 9.3 8.9 - 10.3 mg/dL   Total Protein 7.1 6.5 - 8.1 g/dL   Albumin 3.9 3.5 - 5.0 g/dL   AST 17 15 - 41 U/L   ALT 21 14 - 54 U/L   Alkaline Phosphatase 58 38 - 126 U/L   Total Bilirubin 0.4 0.3 - 1.2 mg/dL   GFR calc non Af Amer >60 >60 mL/min   GFR calc Af Amer >60 >60 mL/min    Comment: (NOTE) The eGFR has been calculated using the CKD EPI equation. This calculation has not been validated in all clinical situations. eGFR's persistently <60 mL/min signify possible Chronic Kidney Disease.    Anion gap 9 5 - 15  Brain natriuretic peptide     Status: Abnormal   Collection Time: 04/24/15  7:09 AM  Result Value Ref Range   B Natriuretic Peptide  123.0 (H) 0.0 - 100.0 pg/mL  Troponin I     Status: None   Collection Time: 04/24/15  9:11 AM  Result Value Ref Range   Troponin I <0.03 <0.031 ng/mL    Comment:        NO INDICATION OF MYOCARDIAL INJURY.  POCT HgB A1C     Status: None   Collection Time: 05/07/15 10:47 AM  Result Value Ref Range   Hemoglobin A1C 5.8      PHQ2/9: Depression screen PHQ 2/9 05/07/2015  Decreased Interest 0  Down, Depressed, Hopeless 0  PHQ - 2 Score 0     Fall Risk: Fall Risk  05/07/2015  Falls in the past year? No    Assessment & Plan  1. Migraine with aura, intractable, with status migrainosus Took medications at home, excuse for work - promethazine (PHENERGAN) injection 25 mg; Inject 1 mL (25 mg total) into the muscle once. - ketorolac (TORADOL) injection 60 mg; Inject 2 mLs (60 mg total) into the muscle once.   

## 2015-08-07 ENCOUNTER — Ambulatory Visit: Payer: PRIVATE HEALTH INSURANCE | Admitting: Family Medicine

## 2015-08-19 ENCOUNTER — Ambulatory Visit (INDEPENDENT_AMBULATORY_CARE_PROVIDER_SITE_OTHER): Payer: Self-pay | Admitting: Family Medicine

## 2015-08-19 ENCOUNTER — Encounter: Payer: Self-pay | Admitting: Family Medicine

## 2015-08-19 VITALS — BP 150/82 | HR 105 | Temp 99.5°F | Resp 16 | Wt 273.4 lb

## 2015-08-19 DIAGNOSIS — J069 Acute upper respiratory infection, unspecified: Secondary | ICD-10-CM

## 2015-08-19 MED ORDER — BENZONATATE 100 MG PO CAPS
100.0000 mg | ORAL_CAPSULE | Freq: Two times a day (BID) | ORAL | Status: DC | PRN
Start: 2015-08-19 — End: 2016-02-01

## 2015-08-19 MED ORDER — PREDNISONE 20 MG PO TABS: 20.0000 mg | ORAL_TABLET | Freq: Two times a day (BID) | ORAL | Status: DC

## 2015-08-19 NOTE — Progress Notes (Signed)
Name: Tiffany Nunez   MRN: [id number]    DOB: 31-Jul-1971   Date:08/19/2015       Progress Note  Subjective  Chief Complaint  Chief Complaint  Patient presents with  . URI    onset 4 days cough,congestion,runny nose, sneezing and sore throat    HPI  URI: symptoms started four days ago, initially had some hoarseness, over the past 3 days has gotten worse with sneezing, rhinorrhea, otalgia, sore throat, abdominal pain and headache from coughing so much. Cough is described as productive, sometimes makes her gag and she vomits, she also has noticed some intermittent wheezing. She has chills and mild decrease in appetite. No rashes.    Patient Active Problem List   Diagnosis Date Noted  . Vitamin D deficiency 05/03/2015  . Dyslipidemia 05/03/2015  . Gastro-esophageal reflux disease without esophagitis 05/03/2015  . Alopecia 05/03/2015  . Genital herpes 05/03/2015  . Dysmetabolic syndrome 99991111  . Positive H. pylori test 05/03/2015  . Neuralgia neuritis, sciatic nerve 05/03/2015  . Migraine without aura and responsive to treatment 04/30/2014  . HTN (hypertension) 01/22/2014  . Morbid obesity ([place]) 01/22/2014  . Diabetes mellitus type 2 in obese ([place]) 01/22/2014  . Decreased cardiac ejection fraction 01/22/2014    Past Surgical History  Procedure Laterality Date  . Tubal ligation    . Ankle surgery      right  . Abdominal hysterectomy      Family History  Problem Relation Age of Onset  . Hypertension Father   . Hyperlipidemia Father   . Hyperlipidemia Mother   . Hypertension Mother   . Hypertension Paternal Aunt   . Cancer Maternal Grandmother     breast    Social History   Social History  . Marital Status: Divorced    Spouse Name: N/A  . Number of Children: N/A  . Years of Education: N/A   Occupational History  . Not on file.   Social History Main Topics  . Smoking status: Never Smoker   . Smokeless tobacco: Never Used  . Alcohol Use: No  . Drug Use:  No  . Sexual Activity: No   Other Topics Concern  . Not on file   Social History Narrative     Current outpatient prescriptions:  .  Butalbital-APAP-Caffeine 50-300-40 MG CAPS, Take 1 capsule by mouth as needed., Disp: 60 capsule, Rfl: 0 .  cloNIDine (CATAPRES) 0.2 MG tablet, Take 1 tablet (0.2 mg total) by mouth at bedtime., Disp: 90 tablet, Rfl: 1 .  hydrALAZINE (APRESOLINE) 50 MG tablet, Take 1 tablet (50 mg total) by mouth 2 (two) times daily., Disp: 180 tablet, Rfl: 1 .  ibuprofen (ADVIL,MOTRIN) 800 MG tablet, Take 1 tablet (800 mg total) by mouth as needed., Disp: 90 tablet, Rfl: 0 .  losartan-hydrochlorothiazide (HYZAAR) 50-12.5 MG per tablet, Take 1 tablet by mouth daily., Disp: 90 tablet, Rfl: 1 .  metFORMIN (GLUCOPHAGE-XR) 500 MG 24 hr tablet, Take 1 tablet (500 mg total) by mouth every evening., Disp: 90 tablet, Rfl: 1 .  nadolol (CORGARD) 40 MG tablet, Take 1 tablet (40 mg total) by mouth 2 (two) times daily., Disp: 180 tablet, Rfl: 1 .  ondansetron (ZOFRAN-ODT) 4 MG disintegrating tablet, Take 1 tablet (4 mg total) by mouth 2 (two) times daily as needed., Disp: 30 tablet, Rfl: 0  Allergies  Allergen Reactions  . Imitrex [Sumatriptan] Other (See Comments)    Reaction: Unknown     ROS  Ten systems reviewed and is negative  except as mentioned in HPI   Objective  Filed Vitals:   08/19/15 1156  BP: 150/82  Pulse: 105  Temp: 99.5 F (37.5 C)  TempSrc: Oral  Resp: 16  Weight: 273 lb 6.4 oz (124.013 kg)  SpO2: 97%    Body mass index is 42.81 kg/(m^2).  Physical Exam  Constitutional: Patient appears well-developed and well-nourished. Obese  No distress.  HEENT: head atraumatic, normocephalic, pupils equal and reactive to light, ears TM normal bilaterally , neck supple, throat within normal limits, tender during palpation of all facial sinus Cardiovascular: Normal rate, regular rhythm and normal heart sounds.  No murmur heard. No BLE edema. Pulmonary/Chest:  Effort normal and breath sounds normal. No respiratory distress. Abdominal: Soft.  There is no tenderness. Psychiatric: Patient has a normal mood and affect. behavior is normal. Judgment and thought content normal.   PHQ2/9: Depression screen [facility] 2/9 08/19/2015 05/07/2015  Decreased Interest 0 0  Down, Depressed, Hopeless 0 0  PHQ - 2 Score 0 0    Fall Risk: Fall Risk  08/19/2015 05/07/2015  Falls in the past year? No No    Functional Status Survey: Is the patient deaf or have difficulty hearing?: No Does the patient have difficulty seeing, even when wearing glasses/contacts?: Yes (glasses) Does the patient have difficulty concentrating, remembering, or making decisions?: No Does the patient have difficulty walking or climbing stairs?: No Does the patient have difficulty dressing or bathing?: No Does the patient have difficulty doing errands alone such as visiting a doctor's office or shopping?: No    Assessment & Plan  1. Upper respiratory infection  Likely viral, we will try prednisone since she is having a lot of chest congestion and hoarseness and benzonate for cough, she is self pay, discussed albuterol inhaler but will not be able to afford it. Advised rest, fluids and time off work until Friday, call back if no improvement of symptoms - predniSONE (DELTASONE) 20 MG tablet; Take 1 tablet (20 mg total) by mouth 2 (two) times daily with a meal.  Dispense: 10 tablet; Refill: 0 - benzonatate (TESSALON) 100 MG capsule; Take 1-2 capsules (100-200 mg total) by mouth 2 (two) times daily as needed for cough.  Dispense: 40 capsule; Refill: 0

## 2015-08-24 ENCOUNTER — Telehealth: Payer: Self-pay | Admitting: Family Medicine

## 2015-08-24 MED ORDER — CHLORPHENIRAMINE-CODEINE 2-10 MG/5ML PO LIQD: 5.0000 mL | Freq: Four times a day (QID) | ORAL | Status: DC | PRN

## 2015-08-24 NOTE — Telephone Encounter (Signed)
Spoke to patient. She sounds better, hoarseness resolved, still coughing. We will call in another cough medication and she will call back if no improvement

## 2015-08-24 NOTE — Telephone Encounter (Signed)
Pt was seen last week for cough and the medicine that was called in is not working for her. She states she is still coughing really bad and would like to know what she needs to do. Please advise.

## 2015-08-27 ENCOUNTER — Ambulatory Visit: Payer: PRIVATE HEALTH INSURANCE | Admitting: Family Medicine

## 2016-02-01 ENCOUNTER — Ambulatory Visit (INDEPENDENT_AMBULATORY_CARE_PROVIDER_SITE_OTHER): Payer: 59 | Admitting: Family Medicine

## 2016-02-01 ENCOUNTER — Encounter: Payer: Self-pay | Admitting: Family Medicine

## 2016-02-01 VITALS — BP 150/100 | HR 69 | Temp 98.1°F | Resp 16 | Ht 67.0 in | Wt 272.1 lb

## 2016-02-01 DIAGNOSIS — G43111 Migraine with aura, intractable, with status migrainosus: Secondary | ICD-10-CM | POA: Diagnosis not present

## 2016-02-01 DIAGNOSIS — R112 Nausea with vomiting, unspecified: Secondary | ICD-10-CM | POA: Diagnosis not present

## 2016-02-01 DIAGNOSIS — I10 Essential (primary) hypertension: Secondary | ICD-10-CM

## 2016-02-01 MED ORDER — PROMETHAZINE HCL 50 MG/ML IJ SOLN: 50.0000 mg | Freq: Four times a day (QID) | INTRAMUSCULAR | Status: DC | PRN

## 2016-02-01 MED ORDER — KETOROLAC TROMETHAMINE 60 MG/2ML IM SOLN
60.0000 mg | Freq: Once | INTRAMUSCULAR | Status: AC
  Administered 2016-02-01: 60 mg via INTRAMUSCULAR

## 2016-02-01 MED ORDER — PROMETHAZINE HCL 25 MG PO TABS: 25.0000 mg | ORAL_TABLET | Freq: Three times a day (TID) | ORAL | Status: DC | PRN

## 2016-02-01 MED ORDER — DEXAMETHASONE SODIUM PHOSPHATE 100 MG/10ML IJ SOLN
10.0000 mg | Freq: Once | INTRAMUSCULAR | Status: AC
  Administered 2016-02-01: 10 mg via INTRAMUSCULAR

## 2016-02-01 MED ORDER — KETOROLAC TROMETHAMINE 60 MG/2ML IM SOLN: 60.0000 mg | Freq: Once | INTRAMUSCULAR | Status: DC

## 2016-02-01 MED ORDER — DEXAMETHASONE SODIUM PHOSPHATE 10 MG/ML IJ SOLN: 10.0000 mg | Freq: Once | INTRAMUSCULAR | Status: DC

## 2016-02-01 MED ORDER — KETOROLAC TROMETHAMINE 60 MG/2ML IJ SOLN: 60.0000 mg | Freq: Once | INTRAMUSCULAR | Status: DC

## 2016-02-01 MED ORDER — BUTALBITAL-APAP-CAFFEINE 50-300-40 MG PO CAPS: 1.0000 | ORAL_CAPSULE | ORAL | Status: DC | PRN

## 2016-02-01 NOTE — Progress Notes (Signed)
Name: Tiffany Nunez   MRN: [id number]    DOB: 1971-06-15   Date:02/01/2016       Progress Note  Subjective  Chief Complaint  Chief Complaint  Patient presents with  . Migraine    Patient states her Migraine has become more frequent and has had a bad Migraine start this morning. Patient state she is having light, noise sensativity, [place] and vomitting and took a Fioricet with no relief.  Patient states her migraines starts in the front of her head and feels like her eye balls hurt.  Needs refill on Ibuprofen and nausea medications  . Hypertension    Can not do 90 day supplies anymore due to Insurance change. Patient had to switch jobs and was without her medication for 8 months, but started back 2 weeks ago. Patient has been experiencing chest Pain and got back on medication.    HPI  Migraine headaches: she has aura, she states she has been having episodes a few times a week, but she woke up today with a severe episode. She states pain is described as pounding, constant, associated with nausea, photophobia and phonophobia. She tried to go to work but had to leave because symptoms were so intense.   HTN: she was without insurance for about 8 months and ran out of medication. She has been back on all medications for the past 2 weeks, she states when bp is high headaches are more frequent. She will return in one week for bp check   Patient Active Problem List   Diagnosis Date Noted  . Vitamin D deficiency 05/03/2015  . Dyslipidemia 05/03/2015  . Gastro-esophageal reflux disease without esophagitis 05/03/2015  . Alopecia 05/03/2015  . Genital herpes 05/03/2015  . Dysmetabolic syndrome 99991111  . Positive H. pylori test 05/03/2015  . Neuralgia neuritis, sciatic nerve 05/03/2015  . Migraine without aura and responsive to treatment 04/30/2014  . HTN (hypertension) 01/22/2014  . Morbid obesity ([place]) 01/22/2014  . Diabetes mellitus type 2 in obese ([place]) 01/22/2014  . Decreased cardiac  ejection fraction 01/22/2014    Past Surgical History  Procedure Laterality Date  . Tubal ligation    . Ankle surgery      right  . Abdominal hysterectomy      Family History  Problem Relation Age of Onset  . Hypertension Father   . Hyperlipidemia Father   . Hyperlipidemia Mother   . Hypertension Mother   . Hypertension Paternal Aunt   . Cancer Maternal Grandmother     breast    Social History   Social History  . Marital Status: Divorced    Spouse Name: N/A  . Number of Children: N/A  . Years of Education: N/A   Occupational History  . Not on file.   Social History Main Topics  . Smoking status: Never Smoker   . Smokeless tobacco: Never Used  . Alcohol Use: No  . Drug Use: No  . Sexual Activity: No   Other Topics Concern  . Not on file   Social History Narrative     Current outpatient prescriptions:  .  Butalbital-APAP-Caffeine 50-300-40 MG CAPS, Take 1 capsule by mouth as needed., Disp: 60 capsule, Rfl: 0 .  cloNIDine (CATAPRES) 0.2 MG tablet, Take 1 tablet (0.2 mg total) by mouth at bedtime., Disp: 90 tablet, Rfl: 1 .  hydrALAZINE (APRESOLINE) 50 MG tablet, Take 1 tablet (50 mg total) by mouth 2 (two) times daily., Disp: 180 tablet, Rfl: 1 .  ibuprofen (ADVIL,MOTRIN) 800  MG tablet, Take 1 tablet (800 mg total) by mouth as needed., Disp: 90 tablet, Rfl: 0 .  losartan-hydrochlorothiazide (HYZAAR) 50-12.5 MG per tablet, Take 1 tablet by mouth daily., Disp: 90 tablet, Rfl: 1 .  metFORMIN (GLUCOPHAGE-XR) 500 MG 24 hr tablet, Take 1 tablet (500 mg total) by mouth every evening., Disp: 90 tablet, Rfl: 1 .  nadolol (CORGARD) 40 MG tablet, Take 1 tablet (40 mg total) by mouth 2 (two) times daily., Disp: 180 tablet, Rfl: 1 .  ondansetron (ZOFRAN-ODT) 4 MG disintegrating tablet, Take 1 tablet (4 mg total) by mouth 2 (two) times daily as needed., Disp: 30 tablet, Rfl: 0  Current facility-administered medications:  .  dexamethasone (DECADRON) injection 10 mg, 10 mg,  Intravenous, Once, Steele Sizer, MD .  ketorolac (TORADOL) injection 60 mg, 60 mg, Intravenous, Once, Steele Sizer, MD .  promethazine (PHENERGAN) injection 50 mg, 50 mg, Intramuscular, Q6H PRN, Steele Sizer, MD  Allergies  Allergen Reactions  . Imitrex [Sumatriptan] Other (See Comments)    Reaction: Unknown     ROS  Ten systems reviewed and is negative except as mentioned in HPI   Objective  Filed Vitals:   02/01/16 1444  BP: 150/100  Pulse: 69  Temp: 98.1 F (36.7 C)  TempSrc: Oral  Resp: 16  Height: 5\' 7"  (1.702 m)  Weight: 272 lb 1.6 oz (123.424 kg)  SpO2: 97%    Body mass index is 42.61 kg/(m^2).  Physical Exam  Constitutional: Patient appears well-developed and well-nourished. Obese No distress.  HEENT: head atraumatic, normocephalic, pupils equal and reactive to light, ears normal TM bilaterally  neck supple, throat within normal limits Cardiovascular: Normal rate, regular rhythm and normal heart sounds.  No murmur heard. No BLE edema. Pulmonary/Chest: Effort normal and breath sounds normal. No respiratory distress. Abdominal: Soft.  There is no tenderness. Psychiatric: Patient has a normal mood and affect. behavior is normal. Judgment and thought content normal. Neurological: normal exam, no focal findings, cranial nerves intact  PHQ2/9: Depression screen [facility] 2/9 02/01/2016 08/19/2015 05/07/2015  Decreased Interest 0 0 0  Down, Depressed, Hopeless 0 0 0  PHQ - 2 Score 0 0 0    Fall Risk: Fall Risk  02/01/2016 08/19/2015 05/07/2015  Falls in the past year? No No No     Functional Status Survey: Is the patient deaf or have difficulty hearing?: No Does the patient have difficulty seeing, even when wearing glasses/contacts?: No Does the patient have difficulty concentrating, remembering, or making decisions?: No Does the patient have difficulty walking or climbing stairs?: No Does the patient have difficulty dressing or bathing?: No Does the patient  have difficulty doing errands alone such as visiting a doctor's office or shopping?: No   Assessment & Plan  1. Migraine with aura, intractable, with status migrainosus  - Butalbital-APAP-Caffeine 50-300-40 MG CAPS; Take 1 capsule by mouth as needed.  Dispense: 60 capsule; Refill: 0 - ketorolac (TORADOL) injection 60 mg; Inject 2 mLs (60 mg total) into the vein once. - promethazine (PHENERGAN) injection 50 mg; Inject 1 mL (50 mg total) into the muscle every 6 (six) hours as needed for nausea or vomiting. - dexamethasone (DECADRON) injection 10 mg; Inject 1 mL (10 mg total) into the vein once.  2. Essential hypertension  Continue medication recheck in one week

## 2016-04-12 ENCOUNTER — Encounter: Payer: Self-pay | Admitting: Family Medicine

## 2016-04-12 ENCOUNTER — Ambulatory Visit (INDEPENDENT_AMBULATORY_CARE_PROVIDER_SITE_OTHER): Payer: 59 | Admitting: Family Medicine

## 2016-04-12 VITALS — BP 122/90 | HR 64 | Temp 99.0°F | Resp 16 | Ht 67.0 in | Wt 278.2 lb

## 2016-04-12 DIAGNOSIS — E1169 Type 2 diabetes mellitus with other specified complication: Secondary | ICD-10-CM

## 2016-04-12 DIAGNOSIS — E785 Hyperlipidemia, unspecified: Secondary | ICD-10-CM

## 2016-04-12 DIAGNOSIS — G43009 Migraine without aura, not intractable, without status migrainosus: Secondary | ICD-10-CM | POA: Diagnosis not present

## 2016-04-12 DIAGNOSIS — E119 Type 2 diabetes mellitus without complications: Secondary | ICD-10-CM | POA: Diagnosis not present

## 2016-04-12 DIAGNOSIS — E559 Vitamin D deficiency, unspecified: Secondary | ICD-10-CM | POA: Diagnosis not present

## 2016-04-12 DIAGNOSIS — R5383 Other fatigue: Secondary | ICD-10-CM | POA: Diagnosis not present

## 2016-04-12 DIAGNOSIS — R112 Nausea with vomiting, unspecified: Secondary | ICD-10-CM

## 2016-04-12 DIAGNOSIS — E669 Obesity, unspecified: Secondary | ICD-10-CM

## 2016-04-12 DIAGNOSIS — I10 Essential (primary) hypertension: Secondary | ICD-10-CM

## 2016-04-12 DIAGNOSIS — Z114 Encounter for screening for human immunodeficiency virus [HIV]: Secondary | ICD-10-CM

## 2016-04-12 DIAGNOSIS — G43111 Migraine with aura, intractable, with status migrainosus: Secondary | ICD-10-CM | POA: Diagnosis not present

## 2016-04-12 LAB — POCT UA - MICROALBUMIN: Microalbumin Ur, POC: 20 mg/L

## 2016-04-12 LAB — POCT GLYCOSYLATED HEMOGLOBIN (HGB A1C): HEMOGLOBIN A1C: 6

## 2016-04-12 MED ORDER — BUTALBITAL-APAP-CAFFEINE 50-300-40 MG PO CAPS: 1.0000 | ORAL_CAPSULE | ORAL | 0 refills | Status: DC | PRN

## 2016-04-12 MED ORDER — HYDRALAZINE HCL 50 MG PO TABS: 50.0000 mg | ORAL_TABLET | Freq: Two times a day (BID) | ORAL | 5 refills | Status: DC

## 2016-04-12 MED ORDER — NADOLOL 40 MG PO TABS: 40.0000 mg | ORAL_TABLET | Freq: Two times a day (BID) | ORAL | 5 refills | Status: DC

## 2016-04-12 MED ORDER — CLONIDINE HCL 0.2 MG PO TABS: 0.2000 mg | ORAL_TABLET | Freq: Every day | ORAL | 5 refills | Status: DC

## 2016-04-12 MED ORDER — PROMETHAZINE HCL 25 MG PO TABS: 25.0000 mg | ORAL_TABLET | Freq: Three times a day (TID) | ORAL | 0 refills | Status: DC | PRN

## 2016-04-12 MED ORDER — IBUPROFEN 800 MG PO TABS: 800.0000 mg | ORAL_TABLET | ORAL | 0 refills | Status: DC | PRN

## 2016-04-12 MED ORDER — LOSARTAN POTASSIUM-HCTZ 100-12.5 MG PO TABS: 1.0000 | ORAL_TABLET | Freq: Every day | ORAL | 5 refills | Status: DC

## 2016-04-12 NOTE — Progress Notes (Signed)
Name: Tiffany Nunez   MRN: [id number]    DOB: [birth year]   Date:04/12/2016       Progress Note  Subjective  Chief Complaint  Chief Complaint  Patient presents with  . Medication Refill    3 month F/U  . Migraine    Patient states "they have been bad here lately", having 2 a week. Patient states the medication helps some of the time.  . Diabetes    Patient states her highest has been 130, lowest-80, average-90-100-checking couple times a week   . Hypertension    HPI  DMII : She has been eating healthier, cutting down on sodas. Not taking Metformin, hgbA1C is at goal, states already scheduled her eye exam. Denies polyphagia, polydipsia or polyuria ( except in am after she takes HCTZ).   HTN: doing well now, no chest pain or palpitation. BP is better but still not at goal.   Migraine headaches: episodes are still two to three times weekly, she is able to work when she has an episode - most of the time, takes medication Ibuprofen of Fioricet prn. Unable to tolerate triptans.  Migraine is described as frontal pain, throbbing like, associated with photophobia and nausea, sometimes vomiting. She described the aura as scotomas.   Obesity: she has gained 6 lbs since last visit, she states she is better financially and has more food in the house.   Dyslipidemia not on medication we will recheck labs  Fatigue: she has some fatigue and would like to have labs done   Patient Active Problem List   Diagnosis Date Noted  . Vitamin D deficiency 05/03/2015  . Dyslipidemia 05/03/2015  . Gastro-esophageal reflux disease without esophagitis 05/03/2015  . Alopecia 05/03/2015  . Genital herpes 05/03/2015  . Dysmetabolic syndrome 99991111  . Positive H. pylori test 05/03/2015  . Neuralgia neuritis, sciatic nerve 05/03/2015  . Migraine without aura and responsive to treatment 04/30/2014  . HTN (hypertension) 01/22/2014  . Morbid obesity ([place]) 01/22/2014  . Diabetes mellitus type 2 in obese  ([place]) 01/22/2014  . Decreased cardiac ejection fraction 01/22/2014    Past Surgical History:  Procedure Laterality Date  . ABDOMINAL HYSTERECTOMY    . ANKLE SURGERY     right  . TUBAL LIGATION      Family History  Problem Relation Age of Onset  . Hypertension Father   . Hyperlipidemia Father   . Hyperlipidemia Mother   . Hypertension Mother   . Hypertension Paternal Aunt   . Cancer Maternal Grandmother     breast    Social History   Social History  . Marital status: Divorced    Spouse name: N/A  . Number of children: N/A  . Years of education: N/A   Occupational History  . Not on file.   Social History Main Topics  . Smoking status: Never Smoker  . Smokeless tobacco: Never Used  . Alcohol use No  . Drug use: No  . Sexual activity: No   Other Topics Concern  . Not on file   Social History Narrative  . No narrative on file     Current Outpatient Prescriptions:  .  Butalbital-APAP-Caffeine 50-300-40 MG CAPS, Take 1 capsule by mouth as needed., Disp: 60 capsule, Rfl: 0 .  cloNIDine (CATAPRES) 0.2 MG tablet, Take 1 tablet (0.2 mg total) by mouth at bedtime., Disp: 30 tablet, Rfl: 5 .  hydrALAZINE (APRESOLINE) 50 MG tablet, Take 1 tablet (50 mg total) by mouth 2 (two) times daily., Disp:  60 tablet, Rfl: 5 .  ibuprofen (ADVIL,MOTRIN) 800 MG tablet, Take 1 tablet (800 mg total) by mouth as needed., Disp: 90 tablet, Rfl: 0 .  nadolol (CORGARD) 40 MG tablet, Take 1 tablet (40 mg total) by mouth 2 (two) times daily., Disp: 60 tablet, Rfl: 5 .  promethazine (PHENERGAN) 25 MG tablet, Take 1 tablet (25 mg total) by mouth every 8 (eight) hours as needed for nausea or vomiting., Disp: 20 tablet, Rfl: 0 .  losartan-hydrochlorothiazide (HYZAAR) 100-12.5 MG tablet, Take 1 tablet by mouth daily., Disp: 30 tablet, Rfl: 5  Allergies  Allergen Reactions  . Imitrex [Sumatriptan] Other (See Comments)    Reaction: Unknown     ROS  Constitutional: Negative for fever, positive  for  weight change.  Respiratory: Negative for cough and shortness of breath.   Cardiovascular: Negative for chest pain or palpitations.  Gastrointestinal: Negative for abdominal pain, no bowel changes.  Musculoskeletal: Negative for gait problem or joint swelling.  Skin: Negative for rash.  Neurological: Negative for dizziness, positive for headache.  No other specific complaints in a complete review of systems (except as listed in HPI above).  Objective  Vitals:   04/12/16 1545  BP: 122/90  Pulse: 64  Resp: 16  Temp: 99 F (37.2 C)  TempSrc: Oral  SpO2: 98%  Weight: 278 lb 3.2 oz (126.2 kg)  Height: 5\' 7"  (1.702 m)    Body mass index is 43.57 kg/m.  Physical Exam  Constitutional: Patient appears well-developed and well-nourished. Obese  No distress.  HEENT: head atraumatic, normocephalic, pupils equal and reactive to light, neck supple, throat within normal limits Cardiovascular: Normal rate, regular rhythm and normal heart sounds.  No murmur heard. No BLE edema. Pulmonary/Chest: Effort normal and breath sounds normal. No respiratory distress. Abdominal: Soft.  There is no tenderness. Psychiatric: Patient has a normal mood and affect. behavior is normal. Judgment and thought content normal. Neurological: no focal findings   Recent Results (from the past 2160 hour(s))  POCT HgB A1C     Status: None   Collection Time: 04/12/16  4:00 PM  Result Value Ref Range   Hemoglobin A1C 6.0   POCT UA - Microalbumin     Status: None   Collection Time: 04/12/16  4:00 PM  Result Value Ref Range   Microalbumin Ur, POC 20 mg/L   Creatinine, POC  mg/dL   Albumin/Creatinine Ratio, Urine, POC        PHQ2/9: Depression screen [facility] 2/9 04/12/2016 02/01/2016 08/19/2015 05/07/2015  Decreased Interest 0 0 0 0  Down, Depressed, Hopeless 0 0 0 0  PHQ - 2 Score 0 0 0 0     Fall Risk: Fall Risk  04/12/2016 02/01/2016 08/19/2015 05/07/2015  Falls in the past year? No No No No      Functional Status Survey: Is the patient deaf or have difficulty hearing?: No Does the patient have difficulty seeing, even when wearing glasses/contacts?: No Does the patient have difficulty concentrating, remembering, or making decisions?: No Does the patient have difficulty walking or climbing stairs?: No Does the patient have difficulty dressing or bathing?: No Does the patient have difficulty doing errands alone such as visiting a doctor's office or shopping?: No   Assessment & Plan  1. Diabetes mellitus type 2 in obese ([facility])  - POCT HgB A1C - POCT UA - Microalbumin  2. Essential hypertension  - nadolol (CORGARD) 40 MG tablet; Take 1 tablet (40 mg total) by mouth 2 (two) times daily.  Dispense: 60 tablet; Refill: 5 - hydrALAZINE (APRESOLINE) 50 MG tablet; Take 1 tablet (50 mg total) by mouth 2 (two) times daily.  Dispense: 60 tablet; Refill: 5 - cloNIDine (CATAPRES) 0.2 MG tablet; Take 1 tablet (0.2 mg total) by mouth at bedtime.  Dispense: 30 tablet; Refill: 5 - losartan-hydrochlorothiazide (HYZAAR) 100-12.5 MG tablet; Take 1 tablet by mouth daily.  Dispense: 30 tablet; Refill: 5 - COMPLETE METABOLIC PANEL WITH GFR - CBC with Differential/Platelet  3. Migraine without aura and responsive to treatment  - ibuprofen (ADVIL,MOTRIN) 800 MG tablet; Take 1 tablet (800 mg total) by mouth as needed.  Dispense: 90 tablet; Refill: 0 - Ambulatory referral to Neurology  4. Dyslipidemia  - Lipid panel  5. Morbid obesity, unspecified obesity type [facility])  Discussed with the patient the risk posed by an increased BMI. Discussed importance of portion control, calorie counting and at least 150 minutes of physical activity weekly. Avoid sweet beverages and drink more water. Eat at least 6 servings of fruit and vegetables daily   6. Migraine with aura, intractable, with status migrainosus  - promethazine (PHENERGAN) 25 MG tablet; Take 1 tablet (25 mg total) by mouth every 8 (eight)  hours as needed for nausea or vomiting.  Dispense: 20 tablet; Refill: 0 - Butalbital-APAP-Caffeine 50-300-40 MG CAPS; Take 1 capsule by mouth as needed.  Dispense: 60 capsule; Refill: 0  7. Nausea and vomiting, vomiting of unspecified type  - promethazine (PHENERGAN) 25 MG tablet; Take 1 tablet (25 mg total) by mouth every 8 (eight) hours as needed for nausea or vomiting.  Dispense: 20 tablet; Refill: 0  8. Vitamin D deficiency  - VITAMIN D 25 Hydroxy (Vit-D Deficiency, Fractures)  9. Other fatigue  - Vitamin B12 - TSH  10. Encounter for screening for HIV  - HIV antibody

## 2016-04-13 ENCOUNTER — Other Ambulatory Visit: Payer: Self-pay | Admitting: Family Medicine

## 2016-04-13 NOTE — Telephone Encounter (Signed)
Patient requesting refill. 

## 2016-05-16 ENCOUNTER — Encounter: Payer: 59 | Admitting: Family Medicine

## 2016-05-27 ENCOUNTER — Other Ambulatory Visit: Payer: Self-pay | Admitting: Family Medicine

## 2016-05-27 NOTE — Telephone Encounter (Signed)
Patient requesting refill. 

## 2016-07-08 ENCOUNTER — Ambulatory Visit (INDEPENDENT_AMBULATORY_CARE_PROVIDER_SITE_OTHER): Payer: 59 | Admitting: Family Medicine

## 2016-07-08 ENCOUNTER — Encounter: Payer: Self-pay | Admitting: Family Medicine

## 2016-07-08 VITALS — BP 128/86 | HR 82 | Temp 98.3°F | Resp 16 | Ht 67.0 in | Wt 271.4 lb

## 2016-07-08 DIAGNOSIS — Z8619 Personal history of other infectious and parasitic diseases: Secondary | ICD-10-CM | POA: Diagnosis not present

## 2016-07-08 DIAGNOSIS — K529 Noninfective gastroenteritis and colitis, unspecified: Secondary | ICD-10-CM | POA: Diagnosis not present

## 2016-07-08 MED ORDER — ONDANSETRON 4 MG PO TBDP: 4.0000 mg | ORAL_TABLET | Freq: Three times a day (TID) | ORAL | 0 refills | Status: DC | PRN

## 2016-07-08 NOTE — Progress Notes (Signed)
Name: Tiffany Nunez   MRN: [id number]    DOB: 1971-01-03   Date:07/08/2016       Progress Note  Subjective  Chief Complaint  Chief Complaint  Patient presents with  . Diarrhea  . Emesis    HPI  Gastroenteritis: she sates symptoms started suddenly with nausea, vomiting and diarrhea 2 days ago. Stools described as loose, not watery, no blood or mucus. Multiple episodes but last one this morning. Vomiting initially was food, but this morning just bile. She states she has a history of h. Pylori and states the epigastric fullness is similar. No fever, chills. Abdominal pain is only prior to bowel movements and is described as cramping. Daughter is symptom free. She ate at [facility] at lunch the day symptoms started - hamburger, fries and a soda.    Patient Active Problem List   Diagnosis Date Noted  . Vitamin D deficiency 05/03/2015  . Dyslipidemia 05/03/2015  . Gastro-esophageal reflux disease without esophagitis 05/03/2015  . Alopecia 05/03/2015  . Genital herpes 05/03/2015  . Dysmetabolic syndrome 99991111  . Positive H. pylori test 05/03/2015  . Neuralgia neuritis, sciatic nerve 05/03/2015  . Migraine without aura and responsive to treatment 04/30/2014  . HTN (hypertension) 01/22/2014  . Morbid obesity ([place]) 01/22/2014  . Diabetes mellitus type 2 in obese ([place]) 01/22/2014  . Decreased cardiac ejection fraction 01/22/2014    Past Surgical History:  Procedure Laterality Date  . ABDOMINAL HYSTERECTOMY    . ANKLE SURGERY     right  . TUBAL LIGATION      Family History  Problem Relation Age of Onset  . Hypertension Father   . Hyperlipidemia Father   . Hyperlipidemia Mother   . Hypertension Mother   . Hypertension Paternal Aunt   . Cancer Maternal Grandmother     breast    Social History   Social History  . Marital status: Divorced    Spouse name: N/A  . Number of children: N/A  . Years of education: N/A   Occupational History  . Not on file.   Social History  Main Topics  . Smoking status: Never Smoker  . Smokeless tobacco: Never Used  . Alcohol use No  . Drug use: No  . Sexual activity: No   Other Topics Concern  . Not on file   Social History Narrative  . No narrative on file     Current Outpatient Prescriptions:  .  Butalbital-APAP-Caffeine 50-300-40 MG CAPS, Take 1 capsule by mouth as needed., Disp: 60 capsule, Rfl: 0 .  cloNIDine (CATAPRES) 0.2 MG tablet, Take 1 tablet (0.2 mg total) by mouth at bedtime., Disp: 30 tablet, Rfl: 5 .  hydrALAZINE (APRESOLINE) 50 MG tablet, Take 1 tablet (50 mg total) by mouth 2 (two) times daily., Disp: 60 tablet, Rfl: 5 .  ibuprofen (ADVIL,MOTRIN) 800 MG tablet, Take 1 tablet (800 mg total) by mouth as needed., Disp: 90 tablet, Rfl: 0 .  losartan-hydrochlorothiazide (HYZAAR) 100-12.5 MG tablet, Take 1 tablet by mouth daily., Disp: 30 tablet, Rfl: 5 .  metFORMIN (GLUCOPHAGE-XR) 500 MG 24 hr tablet, TAKE 1 TABLET (500 MG TOTAL) BY MOUTH EVERY EVENING., Disp: 90 tablet, Rfl: 0 .  nadolol (CORGARD) 40 MG tablet, Take 1 tablet (40 mg total) by mouth 2 (two) times daily., Disp: 60 tablet, Rfl: 5 .  ondansetron (ZOFRAN-ODT) 4 MG disintegrating tablet, Take 1 tablet (4 mg total) by mouth every 8 (eight) hours as needed for nausea or vomiting., Disp: 20 tablet, Rfl: 0 .  promethazine (PHENERGAN) 25 MG tablet, Take 1 tablet (25 mg total) by mouth every 8 (eight) hours as needed for nausea or vomiting., Disp: 20 tablet, Rfl: 0  Allergies  Allergen Reactions  . Imitrex [Sumatriptan] Other (See Comments)    Reaction: Unknown  . Topamax [Topiramate]     Caused hand numbness      ROS  Ten systems reviewed and is negative except as mentioned in HPI   Objective  Vitals:   07/08/16 1442  BP: 128/86  Pulse: 82  Resp: 16  Temp: 98.3 F (36.8 C)  TempSrc: Oral  SpO2: 98%  Weight: 271 lb 6 oz (123.1 kg)  Height: 5\' 7"  (1.702 m)    Body mass index is 42.5 kg/m.  Physical Exam  Constitutional:  Patient appears well-developed and well-nourished. Obese  No distress.  HEENT: head atraumatic, normocephalic, pupils equal and reactive to light, neck supple, throat within normal limits Cardiovascular: Normal rate, regular rhythm and normal heart sounds.  No murmur heard. No BLE edema. Pulmonary/Chest: Effort normal and breath sounds normal. No respiratory distress. Abdominal: Soft.  There is no tenderness. Normal bowel sounds Psychiatric: Patient has a normal mood and affect. behavior is normal. Judgment and thought content normal.  Recent Results (from the past 2160 hour(s))  POCT HgB A1C     Status: None   Collection Time: 04/12/16  4:00 PM  Result Value Ref Range   Hemoglobin A1C 6.0   POCT UA - Microalbumin     Status: None   Collection Time: 04/12/16  4:00 PM  Result Value Ref Range   Microalbumin Ur, POC 20 mg/L   Creatinine, POC  mg/dL   Albumin/Creatinine Ratio, Urine, POC      PHQ2/9: Depression screen [facility] 2/9 07/08/2016 04/12/2016 02/01/2016 08/19/2015 05/07/2015  Decreased Interest 0 0 0 0 0  Down, Depressed, Hopeless 0 0 0 0 0  PHQ - 2 Score 0 0 0 0 0     Fall Risk: Fall Risk  07/08/2016 04/12/2016 02/01/2016 08/19/2015 05/07/2015  Falls in the past year? No No No No No    Functional Status Survey: Is the patient deaf or have difficulty hearing?: No Does the patient have difficulty seeing, even when wearing glasses/contacts?: Yes Does the patient have difficulty concentrating, remembering, or making decisions?: No Does the patient have difficulty dressing or bathing?: No    Assessment & Plan  1. Gastroenteritis  Likely viral gastroenteritis, promethazine makes her too sleepy, we will try Zofran, needs to stay hydrated.  - ondansetron (ZOFRAN-ODT) 4 MG disintegrating tablet; Take 1 tablet (4 mg total) by mouth every 8 (eight) hours as needed for nausea or vomiting.  Dispense: 20 tablet; Refill: 0 - Gastrointestinal Panel by PCR , Stool  2. History of  Helicobacter pylori infection  - H. pylori breath test

## 2016-07-11 LAB — H. PYLORI BREATH TEST: H. pylori Breath Test: NOT DETECTED

## 2016-07-19 ENCOUNTER — Encounter: Payer: Self-pay | Admitting: Family Medicine

## 2016-07-19 ENCOUNTER — Ambulatory Visit (INDEPENDENT_AMBULATORY_CARE_PROVIDER_SITE_OTHER): Payer: 59 | Admitting: Family Medicine

## 2016-07-19 VITALS — BP 122/80 | Temp 98.3°F | Ht 67.0 in | Wt 275.0 lb

## 2016-07-19 DIAGNOSIS — E559 Vitamin D deficiency, unspecified: Secondary | ICD-10-CM

## 2016-07-19 DIAGNOSIS — I10 Essential (primary) hypertension: Secondary | ICD-10-CM

## 2016-07-19 DIAGNOSIS — Z23 Encounter for immunization: Secondary | ICD-10-CM

## 2016-07-19 DIAGNOSIS — E1169 Type 2 diabetes mellitus with other specified complication: Secondary | ICD-10-CM

## 2016-07-19 DIAGNOSIS — E669 Obesity, unspecified: Secondary | ICD-10-CM

## 2016-07-19 DIAGNOSIS — R5383 Other fatigue: Secondary | ICD-10-CM | POA: Diagnosis not present

## 2016-07-19 DIAGNOSIS — E785 Hyperlipidemia, unspecified: Secondary | ICD-10-CM | POA: Diagnosis not present

## 2016-07-19 LAB — CBC WITH DIFFERENTIAL/PLATELET
BASOS ABS: 63 {cells}/uL (ref 0–200)
Basophils Relative: 1 %
EOS PCT: 1 %
Eosinophils Absolute: 63 cells/uL (ref 15–500)
HEMATOCRIT: 41.6 % (ref 35.0–45.0)
HEMOGLOBIN: 13.9 g/dL (ref 11.7–15.5)
LYMPHS PCT: 35 %
Lymphs Abs: 2205 cells/uL (ref 850–3900)
MCH: 28.8 pg (ref 27.0–33.0)
MCHC: 33.4 g/dL (ref 32.0–36.0)
MCV: 86.3 fL (ref 80.0–100.0)
MONO ABS: 378 {cells}/uL (ref 200–950)
MPV: 10.4 fL (ref 7.5–12.5)
Monocytes Relative: 6 %
NEUTROS PCT: 57 %
Neutro Abs: 3591 cells/uL (ref 1500–7800)
Platelets: 337 10*3/uL (ref 140–400)
RBC: 4.82 MIL/uL (ref 3.80–5.10)
RDW: 14.4 % (ref 11.0–15.0)
WBC: 6.3 10*3/uL (ref 3.8–10.8)

## 2016-07-19 LAB — COMPLETE METABOLIC PANEL WITH GFR
ALT: 19 U/L (ref 6–29)
AST: 16 U/L (ref 10–35)
Albumin: 4 g/dL (ref 3.6–5.1)
Alkaline Phosphatase: 69 U/L (ref 33–115)
BILIRUBIN TOTAL: 0.3 mg/dL (ref 0.2–1.2)
BUN: 18 mg/dL (ref 7–25)
CO2: 24 mmol/L (ref 20–31)
Calcium: 9.3 mg/dL (ref 8.6–10.2)
Chloride: 106 mmol/L (ref 98–110)
Creat: 0.92 mg/dL (ref 0.50–1.10)
GFR, EST NON AFRICAN AMERICAN: 75 mL/min (ref >=60)
GFR, Est African American: 87 mL/min (ref >=60)
GLUCOSE: 103 mg/dL — AB (ref 65–99)
POTASSIUM: 4.1 mmol/L (ref 3.5–5.3)
SODIUM: 140 mmol/L (ref 135–146)
TOTAL PROTEIN: 6.9 g/dL (ref 6.1–8.1)

## 2016-07-19 LAB — TSH: TSH: 5.59 mIU/L — ABNORMAL HIGH

## 2016-07-19 LAB — VITAMIN B12: Vitamin B-12: 406 pg/mL (ref 200–1100)

## 2016-07-19 LAB — POCT GLYCOSYLATED HEMOGLOBIN (HGB A1C): HEMOGLOBIN A1C: 5.9

## 2016-07-19 LAB — LIPID PANEL
Cholesterol: 163 mg/dL (ref 125–200)
HDL: 48 mg/dL (ref >=46)
LDL CALC: 101 mg/dL (ref <130)
Total CHOL/HDL Ratio: 3.4 Ratio (ref <=5.0)
Triglycerides: 68 mg/dL (ref <150)
VLDL: 14 mg/dL (ref <30)

## 2016-07-19 MED ORDER — DULAGLUTIDE 1.5 MG/0.5ML ~~LOC~~ SOAJ: 1.5000 mg | Freq: Every day | SUBCUTANEOUS | 2 refills | Status: DC

## 2016-07-19 NOTE — Progress Notes (Signed)
Name: Tiffany Nunez   MRN: [id number]    DOB: 03/15/1971   Date:07/19/2016       Progress Note  Subjective  Chief Complaint  Chief Complaint  Patient presents with  . Follow-up    for gastroenteritis that has improved    HPI  DMII : She has been trying to eat healthier, she is down to two can's of sodas a day. She has been taking Metformin but is still having diarrhea, we will try changing to Trulicity, [id number] is at goal. She needs an eye exam. Denies polyphagia, polydipsia or polyuria.  HTN: doing well now, no chest pain or palpitation. BP is at goal. Compliant with medication.  Migraine headaches: episodes are still two to three times weekly, she is able to work when she has an episode - most of the time, takes medication Ibuprofen of Fioricet prn. Unable to tolerate triptans.  Migraine is described as frontal pain, throbbing like, associated with photophobia and nausea, sometimes vomiting. She described the aura as scotomas. She prefers Zofran instead of promethazine. She has been stressed with her daughter - recent seizure  Obesity: she has gained 6 lbs since last visit, she states she is better financially and has more food in the house.   Dyslipidemia not on medication we will recheck labs  Fatigue: she has some fatigue, we will check labs  Morbid Obesity: discussed life style modification, needs to stop sodas  Patient Active Problem List   Diagnosis Date Noted  . Vitamin D deficiency 05/03/2015  . Dyslipidemia 05/03/2015  . Gastro-esophageal reflux disease without esophagitis 05/03/2015  . Alopecia 05/03/2015  . Genital herpes 05/03/2015  . Dysmetabolic syndrome 99991111  . Positive H. pylori test 05/03/2015  . Neuralgia neuritis, sciatic nerve 05/03/2015  . Migraine without aura and responsive to treatment 04/30/2014  . HTN (hypertension) 01/22/2014  . Morbid obesity ([place]) 01/22/2014  . Diabetes mellitus type 2 in obese ([place]) 01/22/2014  . Decreased cardiac  ejection fraction 01/22/2014    Past Surgical History:  Procedure Laterality Date  . ABDOMINAL HYSTERECTOMY    . ANKLE SURGERY     right  . TUBAL LIGATION      Family History  Problem Relation Age of Onset  . Hypertension Father   . Hyperlipidemia Father   . Hyperlipidemia Mother   . Hypertension Mother   . Hypertension Paternal Aunt   . Cancer Maternal Grandmother     breast    Social History   Social History  . Marital status: Divorced    Spouse name: N/A  . Number of children: N/A  . Years of education: N/A   Occupational History  . Not on file.   Social History Main Topics  . Smoking status: Never Smoker  . Smokeless tobacco: Never Used  . Alcohol use No  . Drug use: No  . Sexual activity: No   Other Topics Concern  . Not on file   Social History Narrative  . No narrative on file     Current Outpatient Prescriptions:  .  Butalbital-APAP-Caffeine 50-300-40 MG CAPS, Take 1 capsule by mouth as needed., Disp: 60 capsule, Rfl: 0 .  cloNIDine (CATAPRES) 0.2 MG tablet, Take 1 tablet (0.2 mg total) by mouth at bedtime., Disp: 30 tablet, Rfl: 5 .  hydrALAZINE (APRESOLINE) 50 MG tablet, Take 1 tablet (50 mg total) by mouth 2 (two) times daily., Disp: 60 tablet, Rfl: 5 .  ibuprofen (ADVIL,MOTRIN) 800 MG tablet, Take 1 tablet (800 mg total) by  mouth as needed., Disp: 90 tablet, Rfl: 0 .  losartan-hydrochlorothiazide (HYZAAR) 100-12.5 MG tablet, Take 1 tablet by mouth daily., Disp: 30 tablet, Rfl: 5 .  nadolol (CORGARD) 40 MG tablet, Take 1 tablet (40 mg total) by mouth 2 (two) times daily., Disp: 60 tablet, Rfl: 5 .  ondansetron (ZOFRAN-ODT) 4 MG disintegrating tablet, Take 1 tablet (4 mg total) by mouth every 8 (eight) hours as needed for nausea or vomiting., Disp: 20 tablet, Rfl: 0 .  Dulaglutide (TRULICITY) 1.5 0000000 SOPN, Inject 1.5 mg into the skin daily., Disp: 4 pen, Rfl: 2  Allergies  Allergen Reactions  . Imitrex [Sumatriptan] Other (See Comments)     Reaction: Unknown  . Topamax [Topiramate]     Caused hand numbness      ROS  Constitutional: Negative for fever , positive for weight change.  Respiratory: Negative for cough and shortness of breath.   Cardiovascular: Negative for chest pain or palpitations.  Gastrointestinal: Negative for abdominal pain, no bowel changes.  Musculoskeletal: Negative for gait problem or joint swelling.  Skin: Negative for rash.  Neurological: Negative for dizziness, positive for headache.  No other specific complaints in a complete review of systems (except as listed in HPI above).  Objective  Vitals:   07/19/16 0804  BP: 122/80  Temp: 98.3 F (36.8 C)  SpO2: 97%  Weight: 275 lb (124.7 kg)  Height: 5\' 7"  (1.702 m)    Body mass index is 43.07 kg/m.  Physical Exam  Constitutional: Patient appears well-developed and well-nourished. Obese  No distress.  HEENT: head atraumatic, normocephalic, pupils equal and reactive to light,neck supple, throat within normal limits Cardiovascular: Normal rate, regular rhythm and normal heart sounds.  No murmur heard. No BLE edema. Pulmonary/Chest: Effort normal and breath sounds normal. No respiratory distress. Abdominal: Soft.  There is no tenderness. Psychiatric: Patient has a normal mood and affect. behavior is normal. Judgment and thought content normal.  Recent Results (from the past 2160 hour(s))  H. pylori breath test     Status: None   Collection Time: 07/08/16  2:58 PM  Result Value Ref Range   H. pylori Breath Test NOT DETECTED Not Detected    Comment:   Antimicrobials, proton pump inhibitors, and bismuth preparations are known to suppress H. pylori, and ingestion of these prior to H. pylori diagnostic testing may lead to false negative results. If clinically indicated, the test may be repeated on a new specimen obtained two weeks after discontinuing treatment.     POCT HgB A1C     Status: Abnormal   Collection Time: 07/19/16  8:50 AM   Result Value Ref Range   Hemoglobin A1C 5.9     Comment: abnormal    Diabetic Foot Exam: Diabetic Foot Exam - Simple   Simple Foot Form Diabetic Foot exam was performed with the following findings:  Yes 07/19/2016  8:29 AM  Visual Inspection No deformities, no ulcerations, no other skin breakdown bilaterally:  Yes Sensation Testing Intact to touch and monofilament testing bilaterally:  Yes Pulse Check Posterior Tibialis and Dorsalis pulse intact bilaterally:  Yes Comments      PHQ2/9: Depression screen [facility] 2/9 07/19/2016 07/08/2016 04/12/2016 02/01/2016 08/19/2015  Decreased Interest 0 0 0 0 0  Down, Depressed, Hopeless 0 0 0 0 0  PHQ - 2 Score 0 0 0 0 0     Fall Risk: Fall Risk  07/19/2016 07/08/2016 04/12/2016 02/01/2016 08/19/2015  Falls in the past year? No No No No No  Functional Status Survey: Is the patient deaf or have difficulty hearing?: No Does the patient have difficulty seeing, even when wearing glasses/contacts?: Yes (glasses) Does the patient have difficulty concentrating, remembering, or making decisions?: No Does the patient have difficulty walking or climbing stairs?: No Does the patient have difficulty dressing or bathing?: No Does the patient have difficulty doing errands alone such as visiting a doctor's office or shopping?: No    Assessment & Plan  1. Diabetes mellitus type 2 in obese ([facility])  - POCT HgB A1C - Dulaglutide (TRULICITY) 1.5 0000000 SOPN; Inject 1.5 mg into the skin daily.  Dispense: 4 pen; Refill: 2  2. Needs flu shot  - Flu Vaccine QUAD 36+ mos PF IM (Fluarix & Fluzone Quad PF)  3. Dyslipidemia  - Lipid panel  4. Morbid obesity, unspecified obesity type ([place])  - TSH  5. Essential hypertension  - COMPLETE METABOLIC PANEL WITH GFR  6. Other fatigue  - CBC with Differential/Platelet - Vitamin B12 - VITAMIN D 25 Hydroxy (Vit-D Deficiency, Fractures) - TSH  7. Vitamin D deficiency  - VITAMIN D 25 Hydroxy  (Vit-D Deficiency, Fractures)

## 2016-07-20 ENCOUNTER — Other Ambulatory Visit: Payer: Self-pay

## 2016-07-20 ENCOUNTER — Other Ambulatory Visit: Payer: Self-pay | Admitting: Family Medicine

## 2016-07-20 DIAGNOSIS — R7989 Other specified abnormal findings of blood chemistry: Secondary | ICD-10-CM

## 2016-07-20 LAB — VITAMIN D 25 HYDROXY (VIT D DEFICIENCY, FRACTURES): VIT D 25 HYDROXY: 10 ng/mL — AB (ref 30–100)

## 2016-07-20 MED ORDER — VITAMIN D (ERGOCALCIFEROL) 1.25 MG (50000 UNIT) PO CAPS: 50000.0000 [IU] | ORAL_CAPSULE | ORAL | 0 refills | Status: DC

## 2016-07-21 ENCOUNTER — Ambulatory Visit (INDEPENDENT_AMBULATORY_CARE_PROVIDER_SITE_OTHER): Payer: 59 | Admitting: Family Medicine

## 2016-07-21 ENCOUNTER — Encounter: Payer: Self-pay | Admitting: Family Medicine

## 2016-07-21 VITALS — BP 148/104 | HR 76 | Temp 98.4°F | Resp 16 | Ht 67.0 in | Wt 276.1 lb

## 2016-07-21 DIAGNOSIS — Z803 Family history of malignant neoplasm of breast: Secondary | ICD-10-CM

## 2016-07-21 DIAGNOSIS — Z01419 Encounter for gynecological examination (general) (routine) without abnormal findings: Secondary | ICD-10-CM

## 2016-07-21 DIAGNOSIS — Z1231 Encounter for screening mammogram for malignant neoplasm of breast: Secondary | ICD-10-CM | POA: Diagnosis not present

## 2016-07-21 DIAGNOSIS — Z1239 Encounter for other screening for malignant neoplasm of breast: Secondary | ICD-10-CM

## 2016-07-21 LAB — THYROID PEROXIDASE ANTIBODY: Thyroperoxidase Ab SerPl-aCnc: >900 IU/mL — ABNORMAL HIGH (ref <9)

## 2016-07-21 NOTE — Progress Notes (Signed)
Name: Tiffany Nunez   MRN: [id number]    DOB: Jul 13, 1971   Date:07/21/2016       Progress Note  Subjective  Chief Complaint  Chief Complaint  Patient presents with  . Annual Exam    HPI   Well Woman: not sexually active for the past 2 years, she had a hysterectomy for fibroids. She is due for mammogram. Her grandmother and great-grandmother had breast cancer in their 64's. She feels tired and TSH was elevated, we are checking T3 and TPO antibodies. Vitamin D also low. Advised to replace Vitamin D for now and monitor TSH  Patient Active Problem List   Diagnosis Date Noted  . Elevated TSH 07/20/2016  . Vitamin D deficiency 05/03/2015  . Dyslipidemia 05/03/2015  . Gastro-esophageal reflux disease without esophagitis 05/03/2015  . Alopecia 05/03/2015  . Genital herpes 05/03/2015  . Dysmetabolic syndrome 29/24/4628  . Positive H. pylori test 05/03/2015  . Neuralgia neuritis, sciatic nerve 05/03/2015  . Migraine without aura and responsive to treatment 04/30/2014  . HTN (hypertension) 01/22/2014  . Morbid obesity ([place]) 01/22/2014  . Diabetes mellitus type 2 in obese ([place]) 01/22/2014  . Decreased cardiac ejection fraction 01/22/2014    Past Surgical History:  Procedure Laterality Date  . ABDOMINAL HYSTERECTOMY    . ANKLE SURGERY     right  . TUBAL LIGATION      Family History  Problem Relation Age of Onset  . Hypertension Father   . Hyperlipidemia Father   . Hyperlipidemia Mother   . Hypertension Mother   . Hypertension Paternal Aunt   . Cancer Maternal Grandmother     breast    Social History   Social History  . Marital status: Divorced    Spouse name: N/A  . Number of children: N/A  . Years of education: N/A   Occupational History  . Not on file.   Social History Main Topics  . Smoking status: Never Smoker  . Smokeless tobacco: Never Used  . Alcohol use No  . Drug use: No  . Sexual activity: No   Other Topics Concern  . Not on file   Social History  Narrative  . No narrative on file     Current Outpatient Prescriptions:  .  Butalbital-APAP-Caffeine 50-300-40 MG CAPS, Take 1 capsule by mouth as needed., Disp: 60 capsule, Rfl: 0 .  cloNIDine (CATAPRES) 0.2 MG tablet, Take 1 tablet (0.2 mg total) by mouth at bedtime., Disp: 30 tablet, Rfl: 5 .  Dulaglutide (TRULICITY) 1.5 MN/8.1RR SOPN, Inject 1.5 mg into the skin daily., Disp: 4 pen, Rfl: 2 .  hydrALAZINE (APRESOLINE) 50 MG tablet, Take 1 tablet (50 mg total) by mouth 2 (two) times daily., Disp: 60 tablet, Rfl: 5 .  ibuprofen (ADVIL,MOTRIN) 800 MG tablet, Take 1 tablet (800 mg total) by mouth as needed., Disp: 90 tablet, Rfl: 0 .  losartan-hydrochlorothiazide (HYZAAR) 100-12.5 MG tablet, Take 1 tablet by mouth daily., Disp: 30 tablet, Rfl: 5 .  nadolol (CORGARD) 40 MG tablet, Take 1 tablet (40 mg total) by mouth 2 (two) times daily., Disp: 60 tablet, Rfl: 5 .  ondansetron (ZOFRAN-ODT) 4 MG disintegrating tablet, Take 1 tablet (4 mg total) by mouth every 8 (eight) hours as needed for nausea or vomiting., Disp: 20 tablet, Rfl: 0 .  Vitamin D, Ergocalciferol, (DRISDOL) 50000 units CAPS capsule, Take 1 capsule (50,000 Units total) by mouth every 7 (seven) days., Disp: 12 capsule, Rfl: 0  Allergies  Allergen Reactions  . Imitrex [Sumatriptan]  Other (See Comments)    Reaction: Unknown  . Topamax [Topiramate]     Caused hand numbness      ROS  Constitutional: Negative for fever, positive for  weight change.  Respiratory: Negative for cough and shortness of breath.   Cardiovascular: Negative for chest pain or palpitations.  Gastrointestinal: Negative for abdominal pain, no bowel changes.  Musculoskeletal: Negative for gait problem or joint swelling.  Skin: Negative for rash.  Neurological: Negative for dizziness or headache.  No other specific complaints in a complete review of systems (except as listed in HPI above).  Objective  Vitals:   07/21/16 0825  BP: (!) 138/98  Pulse: 76   Resp: 16  Temp: 98.4 F (36.9 C)  TempSrc: Oral  SpO2: 96%  Weight: 276 lb 1 oz (125.2 kg)  Height: _0  (1.702 m)    Body mass index is 43.24 kg/m.  Physical Exam  Constitutional: Patient appears well-developed and obese.  No distress.  HENT: Head: Normocephalic and atraumatic. Ears: B TMs ok, no erythema or effusion; Nose: Nose normal. Mouth/Throat: Oropharynx is clear and moist. No oropharyngeal exudate.  Eyes: Conjunctivae and EOM are normal. Pupils are equal, round, and reactive to light. No scleral icterus.  Neck: Normal range of motion. Neck supple. No JVD present. No thyromegaly present.  Cardiovascular: Normal rate, regular rhythm and normal heart sounds.  No murmur heard. No BLE edema. Pulmonary/Chest: Effort normal and breath sounds normal. No respiratory distress. Abdominal: Soft. Bowel sounds are normal, no distension. There is no tenderness. no masses Breast: no lumps or masses, no nipple discharge or rashes FEMALE GENITALIA:  External genitalia normal External urethra normal Cervix absent RECTAL: not done Musculoskeletal: Normal range of motion, no joint effusions. No gross deformities Neurological: he is alert and oriented to person, place, and time. No cranial nerve deficit. Coordination, balance, strength, speech and gait are normal.  Skin: Skin is warm and dry. No rash noted. No erythema.  Psychiatric: Patient has a normal mood and affect. behavior is normal. Judgment and thought content normal.  Recent Results (from the past 2160 hour(s))  H. pylori breath test     Status: None   Collection Time: 07/08/16  2:58 PM  Result Value Ref Range   H. pylori Breath Test NOT DETECTED Not Detected    Comment:   Antimicrobials, proton pump inhibitors, and bismuth preparations are known to suppress H. pylori, and ingestion of these prior to H. pylori diagnostic testing may lead to false negative results. If clinically indicated, the test may be repeated on a new  specimen obtained two weeks after discontinuing treatment.     CBC with Differential/Platelet     Status: None   Collection Time: 07/19/16  8:42 AM  Result Value Ref Range   WBC 6.3 3.8 - 10.8 K/uL   RBC 4.82 3.80 - 5.10 MIL/uL   Hemoglobin 13.9 11.7 - 15.5 g/dL   HCT 41.6 35.0 - 45.0 %   MCV 86.3 80.0 - 100.0 fL   MCH 28.8 27.0 - 33.0 pg   MCHC 33.4 32.0 - 36.0 g/dL   RDW 14.4 11.0 - 15.0 %   Platelets 337 140 - 400 K/uL   MPV 10.4 7.5 - 12.5 fL   Neutro Abs 3,591 1,500 - 7,800 cells/uL   Lymphs Abs 2,205 850 - 3,900 cells/uL   Monocytes Absolute 378 200 - 950 cells/uL   Eosinophils Absolute 63 15 - 500 cells/uL   Basophils Absolute 63 0 - 200 cells/uL  Neutrophils Relative % 57 %   Lymphocytes Relative 35 %   Monocytes Relative 6 %   Eosinophils Relative 1 %   Basophils Relative 1 %   Smear Review Criteria for review not met   Vitamin B12     Status: None   Collection Time: 07/19/16  8:42 AM  Result Value Ref Range   Vitamin B-12 406 200 - 1,100 pg/mL  VITAMIN D 25 Hydroxy (Vit-D Deficiency, Fractures)     Status: Abnormal   Collection Time: 07/19/16  8:42 AM  Result Value Ref Range   Vit D, 25-Hydroxy 10 (L) 30 - 100 ng/mL    Comment: Vitamin D Status           25-OH Vitamin D        Deficiency                <20 ng/mL        Insufficiency         20 - 29 ng/mL        Optimal             > or = 30 ng/mL   For 25-OH Vitamin D testing on patients on D2-supplementation and patients for whom quantitation of D2 and D3 fractions is required, the [facility] 25-OH VIT D, (D2,D3), LC/MS/MS is recommended: order code [phone] (patients > 2 yrs).   TSH     Status: Abnormal   Collection Time: 07/19/16  8:42 AM  Result Value Ref Range   TSH 5.59 (H) mIU/L    Comment:   Reference Range   > or = 20 Years  0.40-4.50   Pregnancy Range First trimester  0.26-2.66 Second trimester 0.55-2.73 Third trimester  0.43-2.91     Lipid panel     Status: None   Collection Time:  07/19/16  8:42 AM  Result Value Ref Range   Cholesterol 163 125 - 200 mg/dL   Triglycerides 68 <150 mg/dL   HDL 48 >=46 mg/dL   Total CHOL/HDL Ratio 3.4 <=5.0 Ratio   VLDL 14 <30 mg/dL   LDL Cholesterol 101 <130 mg/dL    Comment:   Total Cholesterol/HDL Ratio:CHD Risk                        Coronary Heart Disease Risk Table                                        Men       Women          1/2 Average Risk              3.4        3.3              Average Risk              5.0        4.4           2X Average Risk              9.6        7.1           3X Average Risk             23.4       11.0 Use the calculated Patient Ratio above and the CHD Risk table  to determine the patient's CHD Risk.   COMPLETE METABOLIC PANEL WITH GFR     Status: Abnormal   Collection Time: 07/19/16  8:42 AM  Result Value Ref Range   Sodium 140 135 - 146 mmol/L   Potassium 4.1 3.5 - 5.3 mmol/L   Chloride 106 98 - 110 mmol/L   CO2 24 20 - 31 mmol/L   Glucose, Bld 103 (H) 65 - 99 mg/dL   BUN 18 7 - 25 mg/dL   Creat 0.92 0.50 - 1.10 mg/dL   Total Bilirubin 0.3 0.2 - 1.2 mg/dL   Alkaline Phosphatase 69 33 - 115 U/L   AST 16 10 - 35 U/L   ALT 19 6 - 29 U/L   Total Protein 6.9 6.1 - 8.1 g/dL   Albumin 4.0 3.6 - 5.1 g/dL   Calcium 9.3 8.6 - 10.2 mg/dL   GFR, Est African American 87 >=60 mL/min   GFR, Est Non African American 75 >=60 mL/min  POCT HgB A1C     Status: Abnormal   Collection Time: 07/19/16  8:50 AM  Result Value Ref Range   Hemoglobin A1C 5.9     Comment: abnormal     PHQ2/9: Depression screen [facility] 2/9 07/21/2016 07/19/2016 07/08/2016 04/12/2016 02/01/2016  Decreased Interest 0 0 0 0 0  Down, Depressed, Hopeless 0 0 0 0 0  PHQ - 2 Score 0 0 0 0 0     Fall Risk: Fall Risk  07/21/2016 07/19/2016 07/08/2016 04/12/2016 02/01/2016  Falls in the past year? _0      Functional Status Survey: Is the patient deaf or have difficulty hearing?: No Does the patient have difficulty seeing,  even when wearing glasses/contacts?: No Does the patient have difficulty concentrating, remembering, or making decisions?: No Does the patient have difficulty walking or climbing stairs?: No Does the patient have difficulty dressing or bathing?: No Does the patient have difficulty doing errands alone such as visiting a doctor's office or shopping?: No   Assessment & Plan  1. Well woman exam  Discussed importance of 150 minutes of physical activity weekly, eat two servings of fish weekly, eat one serving of tree nuts ( cashews, pistachios, pecans, almonds.Marland Kitchen) every other day, eat 6 servings of fruit/vegetables daily and drink plenty of water and avoid sweet beverages.   2. Breast cancer screening  - MM Digital Screening; Future  3. Family history of breast cancer  - MM Digital Screening; Future

## 2016-07-22 ENCOUNTER — Other Ambulatory Visit: Payer: Self-pay | Admitting: Family Medicine

## 2016-07-22 DIAGNOSIS — R7989 Other specified abnormal findings of blood chemistry: Secondary | ICD-10-CM

## 2016-07-22 LAB — T3, FREE: T3 FREE: 3.2 pg/mL (ref 2.3–4.2)

## 2016-08-08 ENCOUNTER — Ambulatory Visit
Admission: RE | Admit: 2016-08-08 | Discharge: 2016-08-08 | Disposition: A | Discharge status: Home / Self Care | Payer: 59 | Source: Ambulatory Visit | Attending: Family Medicine | Admitting: Family Medicine

## 2016-08-08 DIAGNOSIS — Z803 Family history of malignant neoplasm of breast: Secondary | ICD-10-CM

## 2016-08-08 DIAGNOSIS — Z1239 Encounter for other screening for malignant neoplasm of breast: Secondary | ICD-10-CM

## 2016-08-15 ENCOUNTER — Other Ambulatory Visit: Payer: Self-pay | Admitting: Family Medicine

## 2016-08-15 DIAGNOSIS — R928 Other abnormal and inconclusive findings on diagnostic imaging of breast: Secondary | ICD-10-CM

## 2016-08-23 ENCOUNTER — Ambulatory Visit
Admission: RE | Admit: 2016-08-23 | Discharge: 2016-08-23 | Disposition: A | Discharge status: Home / Self Care | Payer: 59 | Source: Ambulatory Visit | Attending: Family Medicine | Admitting: Family Medicine

## 2016-08-23 ENCOUNTER — Encounter: Payer: Self-pay | Admitting: Family Medicine

## 2016-08-23 DIAGNOSIS — R928 Other abnormal and inconclusive findings on diagnostic imaging of breast: Secondary | ICD-10-CM

## 2016-08-23 LAB — HM MAMMOGRAPHY

## 2016-08-25 ENCOUNTER — Ambulatory Visit (INDEPENDENT_AMBULATORY_CARE_PROVIDER_SITE_OTHER): Payer: 59 | Admitting: Endocrinology

## 2016-08-25 ENCOUNTER — Encounter: Payer: Self-pay | Admitting: Endocrinology

## 2016-08-25 VITALS — BP 138/88 | HR 64 | Wt 280.8 lb

## 2016-08-25 DIAGNOSIS — E038 Other specified hypothyroidism: Secondary | ICD-10-CM

## 2016-08-25 DIAGNOSIS — E063 Autoimmune thyroiditis: Secondary | ICD-10-CM

## 2016-08-25 MED ORDER — LEVOTHYROXINE SODIUM 25 MCG PO TABS: 25.0000 ug | ORAL_TABLET | Freq: Every day | ORAL | 3 refills | Status: DC

## 2016-08-25 NOTE — Progress Notes (Signed)
Patient ID: Tiffany Nunez, female   DOB: Feb 13, 1971, 45 y.o.   MRN: [id number]            Reason for Appointment:  Hypothyroidism, new visit  Referring physician: Dr. Ancil Boozer   History of Present Illness:   Hypothyroidism was first diagnosed in 11/17  At the time of diagnosis patient was having symptoms of  fatigue and sleepiness, cold sensitivity, difficulty concentrating, dry skin and hair loss .    She had been having various symptoms for up to 6 months but  fatigue had been only in the last 2 months or so        She was having a routine physical exam when her TSH was tested  Because of a high TSH level she has been referred here for further management She also has a high TPO antibody result         Patient's weight history is as follows:  Wt Readings from Last 3 Encounters:  08/25/16 280 lb 12.8 oz (127.4 kg)  07/21/16 276 lb 1 oz (125.2 kg)  07/19/16 275 lb (124.7 kg)    Thyroid function results have been as follows:  Lab Results  Component Value Date   TSH 5.59 (H) 07/19/2016   TSH 2.61 12/30/2011     Past Medical History:  Diagnosis Date  . Anemia   . Depressive disorder   . Diabetes mellitus, type II ([place])   . Esophageal reflux   . Essential hypertension, benign   . Genital herpes, unspecified   . Helicobacter pylori (H. pylori)   . Metabolic syndrome   . Migraine, unspecified, without mention of intractable migraine without mention of status migrainosus   . Nonspecific abnormal electrocardiogram (ECG) (EKG)   . Nonspecific abnormal results of thyroid function study   . Obesity, unspecified   . Other and unspecified hyperlipidemia   . Unspecified vitamin D deficiency     Past Surgical History:  Procedure Laterality Date  . ABDOMINAL HYSTERECTOMY    . ANKLE SURGERY     right  . TUBAL LIGATION      Family History  Problem Relation Age of Onset  . Hypertension Father   . Hyperlipidemia Father   . Hyperlipidemia Mother   . Hypertension Mother   .  Hypertension Paternal Aunt   . Cancer Maternal Grandmother     breast    Social History:  reports that she has never smoked. She has never used smokeless tobacco. She reports that she does not drink alcohol or use drugs.  Allergies:  Allergies  Allergen Reactions  . Imitrex [Sumatriptan] Other (See Comments)    Reaction: Unknown  . Topamax [Topiramate]     Caused hand numbness       Medication List       Accurate as of 08/25/16  4:23 PM. Always use your most recent med list.          Butalbital-APAP-Caffeine 50-300-40 MG Caps Take 1 capsule by mouth as needed.   cloNIDine 0.2 MG tablet Commonly known as:  CATAPRES Take 1 tablet (0.2 mg total) by mouth at bedtime.   Dulaglutide 1.5 MG/0.5ML Sopn Commonly known as:  TRULICITY Inject 1.5 mg into the skin daily.   hydrALAZINE 50 MG tablet Commonly known as:  APRESOLINE Take 1 tablet (50 mg total) by mouth 2 (two) times daily.   ibuprofen 800 MG tablet Commonly known as:  ADVIL,MOTRIN Take 1 tablet (800 mg total) by mouth as needed.   losartan-hydrochlorothiazide 100-12.5 MG  tablet Commonly known as:  HYZAAR Take 1 tablet by mouth daily.   nadolol 40 MG tablet Commonly known as:  CORGARD Take 1 tablet (40 mg total) by mouth 2 (two) times daily.   ondansetron 4 MG disintegrating tablet Commonly known as:  ZOFRAN-ODT Take 1 tablet (4 mg total) by mouth every 8 (eight) hours as needed for nausea or vomiting.   Vitamin D (Ergocalciferol) 50000 units Caps capsule Commonly known as:  DRISDOL Take 1 capsule (50,000 Units total) by mouth every 7 (seven) days.       Review of Systems:  Review of Systems  HENT: Positive for headaches.        Has had migraines  Respiratory: Negative for shortness of breath.   Cardiovascular: Negative for leg swelling.  Gastrointestinal: Negative for constipation.  Endocrine: Positive for fatigue. Negative for menstrual changes.  Neurological: Positive for tingling.        Occasional mild tingling in hands  Psychiatric/Behavioral: Positive for insomnia.Negative for depressed mood.   She has had diabetes followed by PCP and well controlled on 2 drugs             Examination:    BP 138/88   Pulse 64   Wt 280 lb 12.8 oz (127.4 kg)   SpO2 97%   BMI 43.98 kg/m   GENERAL:  Large build, has generalized obesity.   No pallor, clubbing, lymphadenopathy or edema.  Skin:  no rash or pigmentation.  EYES:  No prominence of the eyes or swelling of the eyelids  ENT: Oral mucosa and tongue normal.  THYROID:  Enlarged about 1-1/2 times, mostly on the right side, smooth   HEART:  Normal  S1 and S2; no murmur or click.  CHEST:    Lungs: Vescicular breath sounds heard equally.  No crepitations/ wheeze.  ABDOMEN:  No distention.  Liver and spleen not palpable.  No other mass or tenderness.  NEUROLOGICAL: Reflexes are bilaterally normal at biceps, cannot elicit at ankles.  JOINTS:  Normal.   Assessment:  HYPOTHYROIDISM, autoimmune and mild with only slight increase in TSH above normal She does have several symptoms suggestive of hypothyroidism but her fatigue may be more related to her problems with insomnia She only has a minimal thyroid enlargement currently  PLAN:   She will be given a therapeutic trial of levothyroxine 25 g daily If her symptoms improved significantly with normalization of TSH she can continue Discussed in detail the etiology of hypothyroidism as being autoimmune thyroid disease and the natural course Also advised her to take her levothyroxine in the morning before breakfast and avoid any iron or calcium-containing vitamins at the same time  Follow-up in 6 weeks  Consultation note sent to PCP  [facility] 08/25/2016, 4:23 PM

## 2016-09-05 ENCOUNTER — Ambulatory Visit (INDEPENDENT_AMBULATORY_CARE_PROVIDER_SITE_OTHER): Payer: 59 | Admitting: Family Medicine

## 2016-09-05 ENCOUNTER — Encounter: Payer: Self-pay | Admitting: Family Medicine

## 2016-09-05 VITALS — BP 132/72 | HR 67 | Temp 98.1°F | Resp 16 | Ht 67.0 in | Wt 277.8 lb

## 2016-09-05 DIAGNOSIS — E669 Obesity, unspecified: Secondary | ICD-10-CM | POA: Diagnosis not present

## 2016-09-05 DIAGNOSIS — G43111 Migraine with aura, intractable, with status migrainosus: Secondary | ICD-10-CM | POA: Diagnosis not present

## 2016-09-05 DIAGNOSIS — E1169 Type 2 diabetes mellitus with other specified complication: Secondary | ICD-10-CM | POA: Diagnosis not present

## 2016-09-05 DIAGNOSIS — E038 Other specified hypothyroidism: Secondary | ICD-10-CM

## 2016-09-05 DIAGNOSIS — E063 Autoimmune thyroiditis: Secondary | ICD-10-CM

## 2016-09-05 NOTE — Progress Notes (Signed)
Name: Tiffany Nunez   MRN: [id number]    DOB: 10-14-1970   Date:09/05/2016       Progress Note  Subjective  Chief Complaint  Chief Complaint  Patient presents with  . Follow-up    6 week  . Diabetes    Last visit changed from Metformin to Trulicity due to [employer], denies any side effects from new medication except for stinging when injecting into body.    HPI  DMII : Last hgbA1C was 5.9% we stopped Metformin on her last visit because of diarrhea. She is on Trulicity. She eating healthier, she still drink sodas but down to one daily - can. She denies polyphagia, polydipsia or polyuria. She is taking it mostly for migraine. Advised to try caffeinated  unsweetened tea  Migraine headaches: episodes are still two to three times weekly, she is able to work when she has an episode - most of the time, takes medication Ibuprofen of Fioricet prn. Unable to tolerate triptans. Migraine is described as frontal pain, throbbing like, associated with photophobia and nausea, sometimes vomiting. She described the aura as scotomas, this morning she woke up with a migraine, did not take Fioricet because she had to take Levothyroxine with an empty stomach.    Obesity: she gained 2 lbs since last visit, seeing Dr. Ronnald Collum for hypothyroidism, and just started on Levothyroxine  Hypothyroidism: continue follow up with Dr. Ronnald Collum and levothyroxine. She states she is still having fatigue, but not as severe now.   Patient Active Problem List   Diagnosis Date Noted  . Elevated TSH 07/20/2016  . Vitamin D deficiency 05/03/2015  . Dyslipidemia 05/03/2015  . Gastro-esophageal reflux disease without esophagitis 05/03/2015  . Alopecia 05/03/2015  . Genital herpes 05/03/2015  . Dysmetabolic syndrome 92/42/6834  . Positive H. pylori test 05/03/2015  . Neuralgia neuritis, sciatic nerve 05/03/2015  . Migraine without aura and responsive to treatment 04/30/2014  . HTN (hypertension) 01/22/2014  . Morbid  obesity ([place]) 01/22/2014  . Diabetes mellitus type 2 in obese ([place]) 01/22/2014  . Decreased cardiac ejection fraction 01/22/2014    Past Surgical History:  Procedure Laterality Date  . ABDOMINAL HYSTERECTOMY    . ANKLE SURGERY     right  . TUBAL LIGATION      Family History  Problem Relation Age of Onset  . Hypertension Father   . Hyperlipidemia Father   . Hyperlipidemia Mother   . Hypertension Mother   . Hypertension Paternal Aunt   . Cancer Maternal Grandmother     breast    Social History   Social History  . Marital status: Divorced    Spouse name: N/A  . Number of children: N/A  . Years of education: N/A   Occupational History  . Not on file.   Social History Main Topics  . Smoking status: Never Smoker  . Smokeless tobacco: Never Used  . Alcohol use No  . Drug use: No  . Sexual activity: No   Other Topics Concern  . Not on file   Social History Narrative  . No narrative on file     Current Outpatient Prescriptions:  .  Butalbital-APAP-Caffeine 50-300-40 MG CAPS, Take 1 capsule by mouth as needed., Disp: 60 capsule, Rfl: 0 .  cloNIDine (CATAPRES) 0.2 MG tablet, Take 1 tablet (0.2 mg total) by mouth at bedtime., Disp: 30 tablet, Rfl: 5 .  Dulaglutide (TRULICITY) 1.5 HD/6.2IW SOPN, Inject 1.5 mg into the skin daily. (Patient taking differently: Inject 1.5 mg into the  skin once a week. ), Disp: 4 pen, Rfl: 2 .  hydrALAZINE (APRESOLINE) 50 MG tablet, Take 1 tablet (50 mg total) by mouth 2 (two) times daily., Disp: 60 tablet, Rfl: 5 .  ibuprofen (ADVIL,MOTRIN) 800 MG tablet, Take 1 tablet (800 mg total) by mouth as needed., Disp: 90 tablet, Rfl: 0 .  levothyroxine (SYNTHROID) 25 MCG tablet, Take 1 tablet (25 mcg total) by mouth daily before breakfast., Disp: 30 tablet, Rfl: 3 .  losartan-hydrochlorothiazide (HYZAAR) 100-12.5 MG tablet, Take 1 tablet by mouth daily., Disp: 30 tablet, Rfl: 5 .  nadolol (CORGARD) 40 MG tablet, Take 1 tablet (40 mg total) by mouth  2 (two) times daily., Disp: 60 tablet, Rfl: 5 .  ondansetron (ZOFRAN-ODT) 4 MG disintegrating tablet, Take 1 tablet (4 mg total) by mouth every 8 (eight) hours as needed for nausea or vomiting., Disp: 20 tablet, Rfl: 0 .  Vitamin D, Ergocalciferol, (DRISDOL) 50000 units CAPS capsule, Take 1 capsule (50,000 Units total) by mouth every 7 (seven) days., Disp: 12 capsule, Rfl: 0  Allergies  Allergen Reactions  . Imitrex [Sumatriptan] Other (See Comments)    Reaction: Unknown  . Topamax [Topiramate]     Caused hand numbness      ROS  Constitutional: Negative for fever or significant  weight change.  Respiratory: Negative for cough and shortness of breath.   Cardiovascular: Negative for chest pain or palpitations.  Gastrointestinal: Negative for abdominal pain, no bowel changes.  Musculoskeletal: Negative for gait problem or joint swelling.  Skin: Negative for rash.  Neurological: Negative for dizziness, positive  headache.  No other specific complaints in a complete review of systems (except as listed in HPI above).  Objective  Vitals:   09/05/16 0904  BP: 132/72  Pulse: 67  Resp: 16  Temp: 98.1 F (36.7 C)  TempSrc: Oral  SpO2: 98%  Weight: 277 lb 12.8 oz (126 kg)  Height: '5\' 7"'  (1.702 m)    Body mass index is 43.51 kg/m.  Physical Exam  Constitutional: Patient appears well-developed and well-nourished. Obese  No distress.  HEENT: head atraumatic, normocephalic, pupils equal and reactive to light, neck supple, throat within normal limits Cardiovascular: Normal rate, regular rhythm and normal heart sounds.  No murmur heard. No BLE edema. Pulmonary/Chest: Effort normal and breath sounds normal. No respiratory distress. Abdominal: Soft.  There is no tenderness. Psychiatric: Patient has a normal mood and affect. behavior is normal. Judgment and thought content normal.  Recent Results (from the past 2160 hour(s))  H. pylori breath test     Status: None   Collection Time:  07/08/16  2:58 PM  Result Value Ref Range   H. pylori Breath Test NOT DETECTED Not Detected    Comment:   Antimicrobials, proton pump inhibitors, and bismuth preparations are known to suppress H. pylori, and ingestion of these prior to H. pylori diagnostic testing may lead to false negative results. If clinically indicated, the test may be repeated on a new specimen obtained two weeks after discontinuing treatment.     CBC with Differential/Platelet     Status: None   Collection Time: 07/19/16  8:42 AM  Result Value Ref Range   WBC 6.3 3.8 - 10.8 K/uL   RBC 4.82 3.80 - 5.10 MIL/uL   Hemoglobin 13.9 11.7 - 15.5 g/dL   HCT 41.6 35.0 - 45.0 %   MCV 86.3 80.0 - 100.0 fL   MCH 28.8 27.0 - 33.0 pg   MCHC 33.4 32.0 - 36.0 g/dL  RDW 14.4 11.0 - 15.0 %   Platelets 337 140 - 400 K/uL   MPV 10.4 7.5 - 12.5 fL   Neutro Abs 3,591 1,500 - 7,800 cells/uL   Lymphs Abs 2,205 850 - 3,900 cells/uL   Monocytes Absolute 378 200 - 950 cells/uL   Eosinophils Absolute 63 15 - 500 cells/uL   Basophils Absolute 63 0 - 200 cells/uL   Neutrophils Relative % 57 %   Lymphocytes Relative 35 %   Monocytes Relative 6 %   Eosinophils Relative 1 %   Basophils Relative 1 %   Smear Review Criteria for review not met   Vitamin B12     Status: None   Collection Time: 07/19/16  8:42 AM  Result Value Ref Range   Vitamin B-12 406 200 - 1,100 pg/mL  VITAMIN D 25 Hydroxy (Vit-D Deficiency, Fractures)     Status: Abnormal   Collection Time: 07/19/16  8:42 AM  Result Value Ref Range   Vit D, 25-Hydroxy 10 (L) 30 - 100 ng/mL    Comment: Vitamin D Status           25-OH Vitamin D        Deficiency                <20 ng/mL        Insufficiency         20 - 29 ng/mL        Optimal             > or = 30 ng/mL   For 25-OH Vitamin D testing on patients on D2-supplementation and patients for whom quantitation of D2 and D3 fractions is required, the QuestAss[facility]D 25-OH VIT D, (D2,D3), LC/MS/MS is recommended: order  code [id number] (patients > 2 yrs).   TSH     Status: Abnormal   Collection Time: 07/19/16  8:42 AM  Result Value Ref Range   TSH 5.59 (H) mIU/L    Comment:   Reference Range   > or = 20 Years  0.40-4.50   Pregnancy Range First trimester  0.26-2.66 Second trimester 0.55-2.73 Third trimester  0.43-2.91     Lipid panel     Status: None   Collection Time: 07/19/16  8:42 AM  Result Value Ref Range   Cholesterol 163 125 - 200 mg/dL   Triglycerides 68 <150 mg/dL   HDL 48 >=46 mg/dL   Total CHOL/HDL Ratio 3.4 <=5.0 Ratio   VLDL 14 <30 mg/dL   LDL Cholesterol 101 <130 mg/dL    Comment:   Total Cholesterol/HDL Ratio:CHD Risk                        Coronary Heart Disease Risk Table                                        Men       Women          1/2 Average Risk              3.4        3.3              Average Risk              5.0        4.4           2X  Average Risk              9.6        7.1           3X Average Risk             23.4       11.0 Use the calculated Patient Ratio above and the CHD Risk table  to determine the patient's CHD Risk.   COMPLETE METABOLIC PANEL WITH GFR     Status: Abnormal   Collection Time: 07/19/16  8:42 AM  Result Value Ref Range   Sodium 140 135 - 146 mmol/L   Potassium 4.1 3.5 - 5.3 mmol/L   Chloride 106 98 - 110 mmol/L   CO2 24 20 - 31 mmol/L   Glucose, Bld 103 (H) 65 - 99 mg/dL   BUN 18 7 - 25 mg/dL   Creat 0.92 0.50 - 1.10 mg/dL   Total Bilirubin 0.3 0.2 - 1.2 mg/dL   Alkaline Phosphatase 69 33 - 115 U/L   AST 16 10 - 35 U/L   ALT 19 6 - 29 U/L   Total Protein 6.9 6.1 - 8.1 g/dL   Albumin 4.0 3.6 - 5.1 g/dL   Calcium 9.3 8.6 - 10.2 mg/dL   GFR, Est African American 87 >=60 mL/min   GFR, Est Non African American 75 >=60 mL/min  POCT HgB A1C     Status: Abnormal   Collection Time: 07/19/16  8:50 AM  Result Value Ref Range   Hemoglobin A1C 5.9     Comment: abnormal  Thyroid peroxidase antibody     Status: Abnormal   Collection Time:  07/20/16  9:39 AM  Result Value Ref Range   Thyroperoxidase Ab SerPl-aCnc >900 (H) <9 IU/mL  T3, free     Status: None   Collection Time: 07/20/16  9:39 AM  Result Value Ref Range   T3, Free 3.2 2.3 - 4.2 pg/mL  HM MAMMOGRAPHY     Status: None   Collection Time: 08/23/16 12:00 AM  Result Value Ref Range   HM Mammogram 0-4 Bi-Rad 0-4 Bi-Rad, Self Reported Normal    Comment: normal - Birads 1      PHQ2/9: Depression screen [facility] 2/9 07/21/2016 07/19/2016 07/08/2016 04/12/2016 02/01/2016  Decreased Interest 0 0 0 0 0  Down, Depressed, Hopeless 0 0 0 0 0  PHQ - 2 Score 0 0 0 0 0     Fall Risk: Fall Risk  07/21/2016 07/19/2016 07/08/2016 04/12/2016 02/01/2016  Falls in the past year? No No No No No     Assessment & Plan  1. Acquired autoimmune hypothyroidism  Seen by Dr. Ronnald Collum and was started on Levothyroxine a couple of weeks ago, still feeling tired, explained it may take up to 6 weeks for symptoms to improve. She is going back for one visit, but thinking about switching doctors.   2. Diabetes mellitus type 2 in obese [facility])  She is off Metformin for the past 6 weeks, because of diarrhea, but on Trulicity and glucose still controlled.   3. Morbid obesity, unspecified obesity type ([place])  She gained a couple of pounds since last visit, but she is now getting thyroid therapy   4. Migraine with aura, intractable, with status migrainosus  Continue prn medication

## 2016-10-03 ENCOUNTER — Other Ambulatory Visit (INDEPENDENT_AMBULATORY_CARE_PROVIDER_SITE_OTHER): Payer: 59

## 2016-10-03 DIAGNOSIS — E038 Other specified hypothyroidism: Secondary | ICD-10-CM

## 2016-10-03 DIAGNOSIS — E063 Autoimmune thyroiditis: Secondary | ICD-10-CM

## 2016-10-03 LAB — T4, FREE: Free T4: 0.82 ng/dL (ref 0.60–1.60)

## 2016-10-03 LAB — TSH: TSH: 3.18 u[IU]/mL (ref 0.35–4.50)

## 2016-10-06 ENCOUNTER — Ambulatory Visit: Payer: 59 | Admitting: Endocrinology

## 2016-10-17 ENCOUNTER — Ambulatory Visit: Payer: 59 | Admitting: Family Medicine

## 2016-10-25 ENCOUNTER — Ambulatory Visit: Payer: 59 | Admitting: Family Medicine

## 2016-11-09 ENCOUNTER — Encounter: Payer: Self-pay | Admitting: Family Medicine

## 2016-11-09 ENCOUNTER — Ambulatory Visit (INDEPENDENT_AMBULATORY_CARE_PROVIDER_SITE_OTHER): Payer: 59 | Admitting: Family Medicine

## 2016-11-09 VITALS — BP 152/100 | HR 70 | Temp 98.7°F | Resp 16 | Ht 67.0 in | Wt 282.3 lb

## 2016-11-09 DIAGNOSIS — E1169 Type 2 diabetes mellitus with other specified complication: Secondary | ICD-10-CM

## 2016-11-09 DIAGNOSIS — G43009 Migraine without aura, not intractable, without status migrainosus: Secondary | ICD-10-CM | POA: Diagnosis not present

## 2016-11-09 DIAGNOSIS — G43111 Migraine with aura, intractable, with status migrainosus: Secondary | ICD-10-CM | POA: Diagnosis not present

## 2016-11-09 DIAGNOSIS — I10 Essential (primary) hypertension: Secondary | ICD-10-CM | POA: Diagnosis not present

## 2016-11-09 DIAGNOSIS — E038 Other specified hypothyroidism: Secondary | ICD-10-CM | POA: Diagnosis not present

## 2016-11-09 DIAGNOSIS — E669 Obesity, unspecified: Secondary | ICD-10-CM | POA: Diagnosis not present

## 2016-11-09 DIAGNOSIS — E785 Hyperlipidemia, unspecified: Secondary | ICD-10-CM | POA: Diagnosis not present

## 2016-11-09 DIAGNOSIS — E063 Autoimmune thyroiditis: Secondary | ICD-10-CM | POA: Insufficient documentation

## 2016-11-09 LAB — POCT GLYCOSYLATED HEMOGLOBIN (HGB A1C): HEMOGLOBIN A1C: 6.2

## 2016-11-09 MED ORDER — IBUPROFEN 800 MG PO TABS: 800.0000 mg | ORAL_TABLET | ORAL | 0 refills | Status: DC | PRN

## 2016-11-09 MED ORDER — HYDRALAZINE HCL 50 MG PO TABS: 50.0000 mg | ORAL_TABLET | Freq: Two times a day (BID) | ORAL | 5 refills | Status: DC

## 2016-11-09 MED ORDER — BUTALBITAL-APAP-CAFFEINE 50-300-40 MG PO CAPS: 1.0000 | ORAL_CAPSULE | Freq: Four times a day (QID) | ORAL | 0 refills | Status: DC | PRN

## 2016-11-09 MED ORDER — LOSARTAN POTASSIUM-HCTZ 100-12.5 MG PO TABS: 1.0000 | ORAL_TABLET | Freq: Every day | ORAL | 5 refills | Status: DC

## 2016-11-09 MED ORDER — CLONIDINE HCL 0.2 MG PO TABS: 0.2000 mg | ORAL_TABLET | Freq: Every day | ORAL | 5 refills | Status: DC

## 2016-11-09 MED ORDER — NADOLOL 40 MG PO TABS: 40.0000 mg | ORAL_TABLET | Freq: Two times a day (BID) | ORAL | 5 refills | Status: DC

## 2016-11-09 MED ORDER — DULAGLUTIDE 1.5 MG/0.5ML ~~LOC~~ SOAJ: 1.5000 mg | SUBCUTANEOUS | 2 refills | Status: DC

## 2016-11-09 MED ORDER — BUTALBITAL-APAP-CAFFEINE 50-300-40 MG PO CAPS
1.0000 | ORAL_CAPSULE | ORAL | 0 refills | Status: DC | PRN
Start: 2016-11-09 — End: 2016-11-09

## 2016-11-09 MED ORDER — IBUPROFEN 800 MG PO TABS
800.0000 mg | ORAL_TABLET | Freq: Three times a day (TID) | ORAL | 0 refills | Status: DC | PRN
Start: 2016-11-09 — End: 2017-06-12

## 2016-11-09 MED ORDER — ONDANSETRON 4 MG PO TBDP: 4.0000 mg | ORAL_TABLET | Freq: Three times a day (TID) | ORAL | 0 refills | Status: DC | PRN

## 2016-11-09 NOTE — Addendum Note (Signed)
Addended by: Vonna Kotyk L on: 11/09/2016 10:14 AM   Modules accepted: Orders

## 2016-11-09 NOTE — Progress Notes (Signed)
Name: Tiffany Nunez   MRN: [id number]    DOB: December 09, 1970   Date:11/09/2016       Progress Note  Subjective  Chief Complaint  Chief Complaint  Patient presents with  . Hypothyroidism    6 week follow up    HPI  DMII : Last hgbA1C was 5.9% today was 6.2 % .We stopped Metformin in 2017 because of diarrhea. She is on Trulicity since Fall 99991111 and is tolerating it well, but she skipped last visit. She eating healthier, she still drink sodas but still has a 20 ounce bottle about once a day - but she has been drinking more water, and not longer drinking sodas daily. She denies polyphagia, polydipsia or polyuria.   Migraine headaches: episodes are still two to three times weekly, she is able to work when she has an episode - most of the time, takes medication Ibuprofen of Fioricet prn. Unable to tolerate triptans. Migraine is described as frontal pain, throbbing like, associated with photophobia and nausea, sometimes vomiting. She described the aura as scotomas.  Obesity: she gained 2 lbs since last visit, seeing [name] for hypothyroidism, she has been taking levothyroxine, she is feeling less tired and not falling asleep throughout the day.   Hypothyroidism: continue follow up with [name] and levothyroxine. She states she is still having fatigue, but not as severe now. She is due for repeat labs , advised to keep [place] with appointments  HTN: is is uncontrolled today because she has been out of Hydralazine and Nadolol since last night, out of Losartan for the past 2 weeks, and she did not take any medications this am  Patient Active Problem List   Diagnosis Date Noted  . Acquired autoimmune hypothyroidism 11/09/2016  . Elevated TSH 07/20/2016  . Vitamin D deficiency 05/03/2015  . Dyslipidemia 05/03/2015  . Gastro-esophageal reflux disease without esophagitis 05/03/2015  . Alopecia 05/03/2015  . Genital herpes 05/03/2015  . Dysmetabolic syndrome 99991111  . Positive H. pylori test  05/03/2015  . Neuralgia neuritis, sciatic nerve 05/03/2015  . Migraine without aura and responsive to treatment 04/30/2014  . HTN (hypertension) 01/22/2014  . Morbid obesity ([place]) 01/22/2014  . Diabetes mellitus type 2 in obese ([place]) 01/22/2014  . Decreased cardiac ejection fraction 01/22/2014    Past Surgical History:  Procedure Laterality Date  . ABDOMINAL HYSTERECTOMY    . ANKLE SURGERY     right  . TUBAL LIGATION      Family History  Problem Relation Age of Onset  . Hypertension Father   . Hyperlipidemia Father   . Hyperlipidemia Mother   . Hypertension Mother   . Hypertension Paternal Aunt   . Cancer Maternal Grandmother     breast    Social History   Social History  . Marital status: Divorced    Spouse name: N/A  . Number of children: N/A  . Years of education: N/A   Occupational History  . Not on file.   Social History Main Topics  . Smoking status: Never Smoker  . Smokeless tobacco: Never Used  . Alcohol use No  . Drug use: No  . Sexual activity: No   Other Topics Concern  . Not on file   Social History Narrative  . No narrative on file     Current Outpatient Prescriptions:  .  Butalbital-APAP-Caffeine 50-300-40 MG CAPS, Take 1 capsule by mouth as needed., Disp: 60 capsule, Rfl: 0 .  cloNIDine (CATAPRES) 0.2 MG tablet, Take 1 tablet (0.2  mg total) by mouth at bedtime., Disp: 30 tablet, Rfl: 5 .  Dulaglutide (TRULICITY) 1.5 0000000 SOPN, Inject 1.5 mg into the skin once a week., Disp: 4 pen, Rfl: 2 .  hydrALAZINE (APRESOLINE) 50 MG tablet, Take 1 tablet (50 mg total) by mouth 2 (two) times daily., Disp: 60 tablet, Rfl: 5 .  ibuprofen (ADVIL,MOTRIN) 800 MG tablet, Take 1 tablet (800 mg total) by mouth as needed., Disp: 90 tablet, Rfl: 0 .  levothyroxine (SYNTHROID) 25 MCG tablet, Take 1 tablet (25 mcg total) by mouth daily before breakfast., Disp: 30 tablet, Rfl: 3 .  losartan-hydrochlorothiazide (HYZAAR) 100-12.5 MG tablet, Take 1 tablet by mouth  daily., Disp: 30 tablet, Rfl: 5 .  nadolol (CORGARD) 40 MG tablet, Take 1 tablet (40 mg total) by mouth 2 (two) times daily., Disp: 60 tablet, Rfl: 5 .  ondansetron (ZOFRAN-ODT) 4 MG disintegrating tablet, Take 1 tablet (4 mg total) by mouth every 8 (eight) hours as needed for nausea or vomiting., Disp: 20 tablet, Rfl: 0 .  Vitamin D, Ergocalciferol, (DRISDOL) 50000 units CAPS capsule, Take 1 capsule (50,000 Units total) by mouth every 7 (seven) days., Disp: 12 capsule, Rfl: 0  Allergies  Allergen Reactions  . Imitrex [Sumatriptan] Other (See Comments)    Reaction: Unknown  . Topamax [Topiramate]     Caused hand numbness      ROS  Constitutional: Negative for fever or weight change.  Respiratory: Negative for cough and shortness of breath.   Cardiovascular: Negative for chest pain or palpitations.  Gastrointestinal: Negative for abdominal pain, no bowel changes.  Musculoskeletal: Negative for gait problem or joint swelling.  Skin: Negative for rash.  Neurological: Negative for dizziness, positive  headache.  No other specific complaints in a complete review of systems (except as listed in HPI above).  Objective  Vitals:   11/09/16 0908  BP: (!) 152/100  Pulse: 70  Resp: 16  Temp: 98.7 F (37.1 C)  SpO2: 97%  Weight: 282 lb 5 oz (128.1 kg)  Height: 5\' 7"  (1.702 m)    Body mass index is 44.22 kg/m.  Physical Exam  Constitutional: Patient appears well-developed and well-nourished. Obese No distress.  HEENT: head atraumatic, normocephalic, pupils equal and reactive to light,  neck supple, throat within normal limits Cardiovascular: Normal rate, regular rhythm and normal heart sounds.  No murmur heard. No BLE edema. Pulmonary/Chest: Effort normal and breath sounds normal. No respiratory distress. Abdominal: Soft.  There is no tenderness. Psychiatric: Patient has a normal mood and affect. behavior is normal. Judgment and thought content normal.  Recent Results (from the  past 2160 hour(s))  HM MAMMOGRAPHY     Status: None   Collection Time: 08/23/16 12:00 AM  Result Value Ref Range   HM Mammogram 0-4 Bi-Rad 0-4 Bi-Rad, Self Reported Normal    Comment: normal - [place] 1  TSH     Status: None   Collection Time: 10/03/16 10:14 AM  Result Value Ref Range   TSH 3.18 0.35 - 4.50 uIU/mL  T4, free     Status: None   Collection Time: 10/03/16 10:14 AM  Result Value Ref Range   Free T4 0.82 0.60 - 1.60 ng/dL    Comment: Specimens from patients who are undergoing biotin therapy and /or ingesting biotin supplements may contain high levels of biotin.  The higher biotin concentration in these specimens interferes with this Free T4 assay.  Specimens that contain high levels  of biotin may cause false high results for this  Free T4 assay.  Please interpret results in light of the total clinical presentation of the patient.        PHQ2/9: Depression screen [facility] 2/9 11/09/2016 07/21/2016 07/19/2016 07/08/2016 04/12/2016  Decreased Interest 0 0 0 0 0  Down, Depressed, Hopeless 0 0 0 0 0  PHQ - 2 Score 0 0 0 0 0     Fall Risk: Fall Risk  11/09/2016 07/21/2016 07/19/2016 07/08/2016 04/12/2016  Falls in the past year? No No No No No     Functional Status Survey: Is the patient deaf or have difficulty hearing?: No Does the patient have difficulty seeing, even when wearing glasses/contacts?: No Does the patient have difficulty concentrating, remembering, or making decisions?: No Does the patient have difficulty walking or climbing stairs?: No Does the patient have difficulty dressing or bathing?: No Does the patient have difficulty doing errands alone such as visiting a doctor's office or shopping?: No    Assessment & Plan  1. Diabetes mellitus type 2 in obese ([facility])  - POCT HgB A1C - Dulaglutide (TRULICITY) 1.5 0000000 SOPN; Inject 1.5 mg into the skin once a week.  Dispense: 4 pen; Refill: 2  2. Morbid obesity, unspecified obesity type ([place])  She must stop  drinking sweet beverages  3. Essential hypertension  - nadolol (CORGARD) 40 MG tablet; Take 1 tablet (40 mg total) by mouth 2 (two) times daily.  Dispense: 60 tablet; Refill: 5 - losartan-hydrochlorothiazide (HYZAAR) 100-12.5 MG tablet; Take 1 tablet by mouth daily.  Dispense: 30 tablet; Refill: 5 - hydrALAZINE (APRESOLINE) 50 MG tablet; Take 1 tablet (50 mg total) by mouth 2 (two) times daily.  Dispense: 60 tablet; Refill: 5 - cloNIDine (CATAPRES) 0.2 MG tablet; Take 1 tablet (0.2 mg total) by mouth at bedtime.  Dispense: 30 tablet; Refill: 5  4. Dyslipidemia  Not on statins at this time  5. Migraine without aura and responsive to treatment  - ibuprofen (ADVIL,MOTRIN) 800 MG tablet; Take 1 tablet (800 mg total) by mouth as needed.  Dispense: 90 tablet; Refill: 0 - ondansetron (ZOFRAN-ODT) 4 MG disintegrating tablet; Take 1 tablet (4 mg total) by mouth every 8 (eight) hours as needed for nausea or vomiting.  Dispense: 20 tablet; Refill: 0 - Butalbital-APAP-Caffeine 50-300-40 MG CAPS; Take 1 capsule by mouth as needed.  Dispense: 60 capsule; Refill: 0  6. Acquired autoimmune hypothyroidism  Continue follow up with [name]

## 2017-02-02 ENCOUNTER — Ambulatory Visit: Payer: 59 | Admitting: Family Medicine

## 2017-02-07 ENCOUNTER — Ambulatory Visit: Payer: 59 | Admitting: Family Medicine

## 2017-05-12 ENCOUNTER — Ambulatory Visit (INDEPENDENT_AMBULATORY_CARE_PROVIDER_SITE_OTHER): Payer: 59 | Admitting: Family Medicine

## 2017-05-12 ENCOUNTER — Encounter: Payer: Self-pay | Admitting: Family Medicine

## 2017-05-12 VITALS — BP 136/86 | HR 65 | Temp 98.3°F | Resp 16 | Ht 67.0 in | Wt 287.6 lb

## 2017-05-12 DIAGNOSIS — G44311 Acute post-traumatic headache, intractable: Secondary | ICD-10-CM | POA: Diagnosis not present

## 2017-05-12 DIAGNOSIS — S0990XA Unspecified injury of head, initial encounter: Secondary | ICD-10-CM

## 2017-05-12 DIAGNOSIS — G43111 Migraine with aura, intractable, with status migrainosus: Secondary | ICD-10-CM | POA: Diagnosis not present

## 2017-05-12 MED ORDER — BUTALBITAL-APAP-CAFFEINE 50-300-40 MG PO CAPS: 1.0000 | ORAL_CAPSULE | Freq: Four times a day (QID) | ORAL | 0 refills | Status: DC | PRN

## 2017-05-12 MED ORDER — LIDOCAINE HCL (PF) 1 % IJ SOLN
2.0000 mL | Freq: Once | INTRAMUSCULAR | Status: AC
  Administered 2017-05-12: 2 mL via INTRADERMAL

## 2017-05-12 MED ORDER — KETOROLAC TROMETHAMINE 60 MG/2ML IM SOLN
60.0000 mg | Freq: Once | INTRAMUSCULAR | Status: AC
  Administered 2017-05-12: 60 mg via INTRAMUSCULAR

## 2017-05-12 MED ORDER — TRIAMCINOLONE ACETONIDE 40 MG/ML IJ SUSP
40.0000 mg | Freq: Once | INTRAMUSCULAR | Status: AC
  Administered 2017-05-12: 40 mg via INTRAMUSCULAR

## 2017-05-12 MED ORDER — PROMETHAZINE HCL 25 MG/ML IJ SOLN
25.0000 mg | Freq: Once | INTRAMUSCULAR | Status: AC
  Administered 2017-05-12: 25 mg via INTRAMUSCULAR

## 2017-05-12 MED ORDER — ONDANSETRON 4 MG PO TBDP: 4.0000 mg | ORAL_TABLET | Freq: Three times a day (TID) | ORAL | 0 refills | Status: DC | PRN

## 2017-05-12 NOTE — Progress Notes (Addendum)
Name: Tiffany Nunez   MRN: [id number]    DOB: 06-11-1971   Date:05/12/2017       Progress Note  Subjective  Chief Complaint  Chief Complaint  Patient presents with  . Headache    pt had boxes fall on her head while she was at work yesterday    HPI  Acute headache: she has a history of migraine and was at work, during her break. 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 A small size 10X10 box  ( very light - possibly empty) and a larger boxes that contained styrofoam inside and was heavier. She developed a headache immediately. She states started to pound, initially all over her head, and radiated to left side of head and left side of neck. She developed nausea and vomiting. She was sent to [facility] comp physician that did drug testing on her and was advised to go back to work. She is still having headache, she has taken Fioricet and Ibuprofen. She states left side is not feeling the same. Left side of head feels heavier than left side.    Patient Active Problem List   Diagnosis Date Noted  . Acquired autoimmune hypothyroidism 11/09/2016  . Elevated TSH 07/20/2016  . Vitamin D deficiency 05/03/2015  . Dyslipidemia 05/03/2015  . Gastro-esophageal reflux disease without esophagitis 05/03/2015  . Alopecia 05/03/2015  . Genital herpes 05/03/2015  . Dysmetabolic syndrome 61/95/0932  . Positive H. pylori test 05/03/2015  . Neuralgia neuritis, sciatic nerve 05/03/2015  . Migraine without aura and responsive to treatment 04/30/2014  . HTN (hypertension) 01/22/2014  . Morbid obesity ([place]) 01/22/2014  . Diabetes mellitus type 2 in obese ([place]) 01/22/2014  . Decreased cardiac ejection fraction 01/22/2014    Past Surgical History:  Procedure Laterality Date  . ABDOMINAL HYSTERECTOMY    . ANKLE SURGERY     right  . TUBAL LIGATION      Family History  Problem Relation Age of Onset  . Hypertension  Father   . Hyperlipidemia Father   . Hyperlipidemia Mother   . Hypertension Mother   . Hypertension Paternal Aunt   . Cancer Maternal Grandmother        breast    Social History   Social History  . Marital status: Divorced    Spouse name: N/A  . Number of children: N/A  . Years of education: N/A   Occupational History  . Not on file.   Social History Main Topics  . Smoking status: Never Smoker  . Smokeless tobacco: Never Used  . Alcohol use No  . Drug use: No  . Sexual activity: No   Other Topics Concern  . Not on file   Social History Narrative  . No narrative on file     Current Outpatient Prescriptions:  .  Butalbital-APAP-Caffeine 50-300-40 MG CAPS, Take 1 capsule by mouth every 6 (six) hours as needed., Disp: 60 capsule, Rfl: 0 .  cloNIDine (CATAPRES) 0.2 MG tablet, Take 1 tablet (0.2 mg total) by mouth at bedtime., Disp: 30 tablet, Rfl: 5 .  Dulaglutide (TRULICITY) 1.5 IZ/1.2WP SOPN, Inject 1.5 mg into the skin once a week., Disp: 4 pen, Rfl: 2 .  hydrALAZINE (APRESOLINE) 50 MG tablet, Take 1 tablet (50 mg total) by mouth 2 (two) times daily., Disp: 60 tablet, Rfl: 5 .  ibuprofen (ADVIL,MOTRIN) 800 MG tablet, Take 1 tablet (800 mg total)  by mouth 3 (three) times daily as needed., Disp: 90 tablet, Rfl: 0 .  levothyroxine (SYNTHROID) 25 MCG tablet, Take 1 tablet (25 mcg total) by mouth daily before breakfast., Disp: 30 tablet, Rfl: 3 .  losartan-hydrochlorothiazide (HYZAAR) 100-12.5 MG tablet, Take 1 tablet by mouth daily., Disp: 30 tablet, Rfl: 5 .  nadolol (CORGARD) 40 MG tablet, Take 1 tablet (40 mg total) by mouth 2 (two) times daily., Disp: 60 tablet, Rfl: 5 .  ondansetron (ZOFRAN-ODT) 4 MG disintegrating tablet, Take 1 tablet (4 mg total) by mouth every 8 (eight) hours as needed for nausea or vomiting., Disp: 20 tablet, Rfl: 0 .  Vitamin D, Ergocalciferol, (DRISDOL) 50000 units CAPS capsule, Take 1 capsule (50,000 Units total) by mouth every 7 (seven) days.,  Disp: 12 capsule, Rfl: 0  Allergies  Allergen Reactions  . Imitrex [Sumatriptan] Other (See Comments)    Reaction: Unknown  . Topamax [Topiramate]     Caused hand numbness      ROS  Ten systems reviewed and is negative except as mentioned in HPI   Objective  Vitals:   05/12/17 1446  BP: 136/86  Pulse: 65  Resp: 16  Temp: 98.3 F (36.8 C)  SpO2: 97%  Weight: 287 lb 9 oz (130.4 kg)  Height: 5\' 7"  (1.702 m)    Body mass index is 45.04 kg/m.  Physical Exam  Constitutional: Patient appears well-developed and well-nourished. Obese  No distress.  HEENT: head atraumatic, normocephalic, pupils equal and reactive to light, neck supple, throat within normal limits Cardiovascular: Normal rate, regular rhythm and normal heart sounds.  No murmur heard. No BLE edema. Pulmonary/Chest: Effort normal and breath sounds normal. No respiratory distress. Abdominal: Soft.  There is no tenderness. Psychiatric: Patient has a normal mood and affect. behavior is normal. Judgment and thought content normal. Neurological: no focal findings.   PHQ2/9: Depression screen [facility] 2/9 11/09/2016 07/21/2016 07/19/2016 07/08/2016 04/12/2016  Decreased Interest 0 0 0 0 0  Down, Depressed, Hopeless 0 0 0 0 0  PHQ - 2 Score 0 0 0 0 0    Fall Risk: Fall Risk  11/09/2016 07/21/2016 07/19/2016 07/08/2016 04/12/2016  Falls in the past year? No No No No No     Assessment & Plan  1. Injury of head, initial encounter  At work on 05/12/2017  2. Intractable acute post-traumatic headache  - Butalbital-APAP-Caffeine 50-300-40 MG CAPS; Take 1 capsule by mouth every 6 (six) hours as needed.  Dispense: 60 capsule; Refill: 0 - ondansetron (ZOFRAN-ODT) 4 MG disintegrating tablet; Take 1 tablet (4 mg total) by mouth every 8 (eight) hours as needed for nausea or vomiting.  Dispense: 20 tablet; Refill: 0 - triamcinolone acetonide (KENALOG-40) injection 40 mg; Inject 1 mL (40 mg total) into the muscle once. - lidocaine  (PF) (XYLOCAINE) 1 % injection 2 mL; Inject 2 mLs into the skin once. - ketorolac (TORADOL) injection 60 mg; Inject 2 mLs (60 mg total) into the muscle once. - promethazine (PHENERGAN) injection 25 mg; Inject 1 mL (25 mg total) into the muscle once.  3. Migraine with aura, intractable, with status migrainosus  Migraine likely triggered by head trauma, boxes were light and unlikely to have caused concussion. - Butalbital-APAP-Caffeine 50-300-40 MG CAPS; Take 1 capsule by mouth every 6 (six) hours as needed.  Dispense: 60 capsule; Refill: 0 - ondansetron (ZOFRAN-ODT) 4 MG disintegrating tablet; Take 1 tablet (4 mg total) by mouth every 8 (eight) hours as needed for nausea or vomiting.  Dispense: 20  tablet; Refill: 0 - triamcinolone acetonide (KENALOG-40) injection 40 mg; Inject 1 mL (40 mg total) into the muscle once. - lidocaine (PF) (XYLOCAINE) 1 % injection 2 mL; Inject 2 mLs into the skin once. - ketorolac (TORADOL) injection 60 mg; Inject 2 mLs (60 mg total) into the muscle once. - promethazine (PHENERGAN) injection 25 mg; Inject 1 mL (25 mg total) into the muscle once.

## 2017-05-14 ENCOUNTER — Emergency Department (HOSPITAL_COMMUNITY): Payer: Worker's Compensation

## 2017-05-14 ENCOUNTER — Emergency Department (HOSPITAL_COMMUNITY)
Admission: EM | Admit: 2017-05-14 | Discharge: 2017-05-14 | Disposition: A | Discharge status: Home / Self Care | Payer: Worker's Compensation | Attending: Emergency Medicine | Admitting: Emergency Medicine

## 2017-05-14 ENCOUNTER — Encounter (HOSPITAL_COMMUNITY): Payer: Self-pay

## 2017-05-14 DIAGNOSIS — R202 Paresthesia of skin: Secondary | ICD-10-CM | POA: Diagnosis not present

## 2017-05-14 DIAGNOSIS — I10 Essential (primary) hypertension: Secondary | ICD-10-CM | POA: Diagnosis not present

## 2017-05-14 DIAGNOSIS — H5712 Ocular pain, left eye: Secondary | ICD-10-CM | POA: Diagnosis present

## 2017-05-14 DIAGNOSIS — Z79899 Other long term (current) drug therapy: Secondary | ICD-10-CM | POA: Insufficient documentation

## 2017-05-14 DIAGNOSIS — E119 Type 2 diabetes mellitus without complications: Secondary | ICD-10-CM | POA: Insufficient documentation

## 2017-05-14 DIAGNOSIS — R519 Headache, unspecified: Secondary | ICD-10-CM

## 2017-05-14 DIAGNOSIS — Z7984 Long term (current) use of oral hypoglycemic drugs: Secondary | ICD-10-CM | POA: Diagnosis not present

## 2017-05-14 DIAGNOSIS — R51 Headache: Secondary | ICD-10-CM | POA: Diagnosis not present

## 2017-05-14 LAB — CBC WITH DIFFERENTIAL/PLATELET
Basophils Absolute: 0 10*3/uL (ref 0.0–0.1)
Basophils Relative: 0 %
EOS ABS: 0 10*3/uL (ref 0.0–0.7)
EOS PCT: 1 %
HCT: 42.8 % (ref 36.0–46.0)
Hemoglobin: 14 g/dL (ref 12.0–15.0)
LYMPHS ABS: 2.5 10*3/uL (ref 0.7–4.0)
Lymphocytes Relative: 36 %
MCH: 29.2 pg (ref 26.0–34.0)
MCHC: 32.7 g/dL (ref 30.0–36.0)
MCV: 89.4 fL (ref 78.0–100.0)
Monocytes Absolute: 0.5 10*3/uL (ref 0.1–1.0)
Monocytes Relative: 7 %
Neutro Abs: 3.9 10*3/uL (ref 1.7–7.7)
Neutrophils Relative %: 56 %
PLATELETS: 393 10*3/uL (ref 150–400)
RBC: 4.79 MIL/uL (ref 3.87–5.11)
RDW: 14.3 % (ref 11.5–15.5)
WBC: 7 10*3/uL (ref 4.0–10.5)

## 2017-05-14 LAB — I-STAT BETA HCG BLOOD, ED (MC, WL, AP ONLY)

## 2017-05-14 LAB — BASIC METABOLIC PANEL
Anion gap: 9 (ref 5–15)
BUN: 10 mg/dL (ref 6–20)
CALCIUM: 9.9 mg/dL (ref 8.9–10.3)
CHLORIDE: 102 mmol/L (ref 101–111)
CO2: 27 mmol/L (ref 22–32)
CREATININE: 0.88 mg/dL (ref 0.44–1.00)
GFR calc Af Amer: >60 mL/min (ref >60)
Glucose, Bld: 97 mg/dL (ref 65–99)
Potassium: 3.5 mmol/L (ref 3.5–5.1)
SODIUM: 138 mmol/L (ref 135–145)

## 2017-05-14 MED ORDER — DIPHENHYDRAMINE HCL 50 MG/ML IJ SOLN
25.0000 mg | Freq: Once | INTRAMUSCULAR | Status: AC
  Administered 2017-05-14: 25 mg via INTRAVENOUS
  Filled 2017-05-14: qty 1

## 2017-05-14 MED ORDER — TETRACAINE HCL 0.5 % OP SOLN
1.0000 [drp] | Freq: Once | OPHTHALMIC | Status: AC
  Administered 2017-05-14: 1 [drp] via OPHTHALMIC
  Filled 2017-05-14: qty 4

## 2017-05-14 MED ORDER — KETOROLAC TROMETHAMINE 30 MG/ML IJ SOLN
30.0000 mg | Freq: Once | INTRAMUSCULAR | Status: AC
  Administered 2017-05-14: 30 mg via INTRAVENOUS
  Filled 2017-05-14: qty 1

## 2017-05-14 MED ORDER — SODIUM CHLORIDE 0.9 % IV BOLUS (SEPSIS)
1000.0000 mL | Freq: Once | INTRAVENOUS | Status: AC
  Administered 2017-05-14: 1000 mL via INTRAVENOUS

## 2017-05-14 MED ORDER — METOCLOPRAMIDE HCL 5 MG/ML IJ SOLN
10.0000 mg | Freq: Once | INTRAMUSCULAR | Status: AC
  Administered 2017-05-14: 10 mg via INTRAVENOUS
  Filled 2017-05-14: qty 2

## 2017-05-14 MED ORDER — PROCHLORPERAZINE MALEATE 5 MG PO TABS
10.0000 mg | ORAL_TABLET | Freq: Once | ORAL | Status: AC
  Administered 2017-05-14: 10 mg via ORAL
  Filled 2017-05-14: qty 2

## 2017-05-14 NOTE — ED Provider Notes (Signed)
Patient care assumed from [facility], PA-C at shift change. Please see her note for further.  Plan at shift change was for discharge if MRI brain is unremarkable. Symptoms have resolved.  MRI brain shows no acute intracranial abnormality. It does show a small chronic infarct in the right cerebellum but otherwise normal noncontrast MRI appearance of the brain. In my evaluation patient's symptoms of still resolved. I informed her of the test results. I encouraged her to follow-up with primary care closely and to discuss being on a daily aspirin. Patient agrees with plan. Return precautions discussed. I advised the patient to follow-up with their primary care provider this week. I advised the patient to return to the emergency department with new or worsening symptoms or new concerns. The patient verbalized understanding and agreement with plan.   Ct Head Wo Contrast  Addendum Date: 05/14/2017   ADDENDUM REPORT: 05/14/2017 17:26 ADDENDUM: It Should be noted that the entirety of the orbits were included on the CT of the head. No orbital wall fracture is identified. The globes are within normal limits. No pre or postseptal abnormality is identified. Electronically Signed   By: Inez Catalina M.D.   On: 05/14/2017 17:26   Result Date: 05/14/2017 CLINICAL DATA:  Facial droop on the left EXAM: CT HEAD WITHOUT CONTRAST TECHNIQUE: Contiguous axial images were obtained from the base of the skull through the vertex without intravenous contrast. COMPARISON:  02/14/2010 FINDINGS: Brain: No evidence of acute infarction, hemorrhage, hydrocephalus, extra-axial collection or mass lesion/mass effect. Vascular: No hyperdense vessel or unexpected calcification. Skull: Normal. Negative for fracture or focal lesion. Sinuses/Orbits: No acute finding. Other: None. IMPRESSION: No acute abnormality noted. Electronically Signed: By: Inez Catalina M.D. On: 05/14/2017 16:26   Mr Brain Wo Contrast  Result Date: 05/14/2017 CLINICAL  DATA:  46 year old female status post blunt trauma when 2 boxes fell on her head at work. Subsequent headache, nausea vomiting and facial numbness. EXAM: MRI HEAD WITHOUT CONTRAST TECHNIQUE: Multiplanar, multiecho pulse sequences of the brain and surrounding structures were obtained without intravenous contrast. COMPARISON:  Head CT without contrast 1611 hours today and earlier. FINDINGS: Brain: Normal cerebral volume. No restricted diffusion to suggest acute infarction. No midline shift, mass effect, evidence of mass lesion, ventriculomegaly, extra-axial collection or acute intracranial hemorrhage. Cervicomedullary junction and pituitary are within normal limits. Small chronic lacunar type infarct in the mid right cerebellar hemisphere (series 8, image 6). Otherwise normal gray and white matter signal throughout the brain. No other encephalomalacia and no chronic cerebral blood products identified. Vascular: Major intracranial vascular flow voids are preserved. Skull and upper cervical spine: Negative. Visualized bone marrow signal is within normal limits. Sinuses/Orbits: Disc conjugate gaze but otherwise normal orbits soft tissues. Visualized paranasal sinuses and mastoids are stable and well pneumatized. Other: Visible internal auditory structures appear normal. Normal stylomastoid foramina. Scalp and face soft tissues appear negative. IMPRESSION: 1.  No acute intracranial abnormality. 2. There is a small chronic infarct in the right cerebellum, but otherwise normal noncontrast MRI appearance of the brain. Electronically Signed   By: Genevie Ann M.D.   On: 05/14/2017 21:07       Waynetta Pean, PA-C 05/14/17 2155    Malvin Johns, MD 05/14/17 2239

## 2017-05-14 NOTE — ED Notes (Signed)
ED Provider at bedside. 

## 2017-05-14 NOTE — ED Notes (Signed)
Pt ambulatory to restroom with steady gait.

## 2017-05-14 NOTE — ED Notes (Addendum)
Per MRI, pt to be transported to scan in approx 1 hr. Pt and family aware.

## 2017-05-14 NOTE — ED Provider Notes (Signed)
[facility] DEPT Provider Note   CSN: [id number] Arrival date & time: 05/14/17  1341     History   Chief Complaint Chief Complaint  Patient presents with  . Eye Problem    HPI Tiffany Nunez is a 46 y.o. female who presents with left-sided headache 3 days. Patient also reports that today at approximately 9 AM she felt a left-sided facial droop, left-sided facial numbness and had difficulty opening her left eye. Patient reports that a proximal leg 3 days ago, while she was at work, 2 boxes fell on her head. Patient states that she did not have any LOC. She had one episode of vomiting immediately after the incident but none since. Patient states that she was seen by employee health doctor originally after the incident and had no workup done. Patient states that she was told to go home and rest. Patient reports that next day she followed up with her primary care doctor who gave her some medications for a migraine and discharge her home. Patient states that she's continued to have persistent left-sided headache since incident. Patient states that she has been taking her regular medications with minimal improvement. Patient has a history of migraines but states the headache is completely different than her migraines. Patient also reports that this morning she felt like her left side of her face was drooping. Additionally, patient states that she is having difficulty opening her left eye. She reports some pain and tenderness in the left periorbital region but has not noticed any redness. Patient denies any associated photophobia or vision changes. Patient denies any other trauma, injury. Patient denies any fevers/chills, numbness/weakness, dizziness, chest pain, abdominal pain, nausea/vomiting, speech difficulty, slurred speech.  The history is provided by the patient.    Past Medical History:  Diagnosis Date  . Anemia   . Depressive disorder   . Diabetes mellitus, type II ([place])   .  Esophageal reflux   . Essential hypertension, benign   . Genital herpes, unspecified   . Helicobacter pylori (H. pylori)   . Metabolic syndrome   . Migraine, unspecified, without mention of intractable migraine without mention of status migrainosus   . Nonspecific abnormal electrocardiogram (ECG) (EKG)   . Nonspecific abnormal results of thyroid function study   . Obesity, unspecified   . Other and unspecified hyperlipidemia   . Unspecified vitamin D deficiency     Patient Active Problem List   Diagnosis Date Noted  . Acquired autoimmune hypothyroidism 11/09/2016  . Elevated TSH 07/20/2016  . Vitamin D deficiency 05/03/2015  . Dyslipidemia 05/03/2015  . Gastro-esophageal reflux disease without esophagitis 05/03/2015  . Alopecia 05/03/2015  . Genital herpes 05/03/2015  . Dysmetabolic syndrome 42/68/3419  . Positive H. pylori test 05/03/2015  . Neuralgia neuritis, sciatic nerve 05/03/2015  . Migraine without aura and responsive to treatment 04/30/2014  . HTN (hypertension) 01/22/2014  . Morbid obesity ([place]) 01/22/2014  . Diabetes mellitus type 2 in obese ([place]) 01/22/2014  . Decreased cardiac ejection fraction 01/22/2014    Past Surgical History:  Procedure Laterality Date  . ABDOMINAL HYSTERECTOMY    . ANKLE SURGERY     right  . TUBAL LIGATION      OB History    No data available       Home Medications    Prior to Admission medications   Medication Sig Start Date End Date Taking? Authorizing Provider  Butalbital-APAP-Caffeine 50-300-40 MG CAPS Take 1 capsule by mouth every 6 (six) hours as needed. 05/12/17  Yes Steele Sizer, MD  cloNIDine (CATAPRES) 0.2 MG tablet Take 1 tablet (0.2 mg total) by mouth at bedtime. 11/09/16  Yes Sowles, Drue Stager, MD  Dulaglutide (TRULICITY) 1.5 DZ/3.2DJ SOPN Inject 1.5 mg into the skin once a week. 11/09/16  Yes Sowles, Drue Stager, MD  hydrALAZINE (APRESOLINE) 50 MG tablet Take 1 tablet (50 mg total) by mouth 2 (two) times daily. 11/09/16   Yes Sowles, Drue Stager, MD  ibuprofen (ADVIL,MOTRIN) 800 MG tablet Take 1 tablet (800 mg total) by mouth 3 (three) times daily as needed. Patient taking differently: Take 800 mg by mouth 3 (three) times daily as needed for headache.  11/09/16  Yes Sowles, Drue Stager, MD  losartan-hydrochlorothiazide (HYZAAR) 100-12.5 MG tablet Take 1 tablet by mouth daily. 11/09/16  Yes Sowles, Drue Stager, MD  nadolol (CORGARD) 40 MG tablet Take 1 tablet (40 mg total) by mouth 2 (two) times daily. 11/09/16  Yes Sowles, Drue Stager, MD  promethazine (PHENERGAN) 25 MG tablet Take 25 mg by mouth every 6 (six) hours as needed for nausea or vomiting.   Yes [provider]  levothyroxine (SYNTHROID) 25 MCG tablet Take 1 tablet (25 mcg total) by mouth daily before breakfast. Patient not taking: Reported on 05/14/2017 08/25/16   Elayne Snare, MD  ondansetron (ZOFRAN-ODT) 4 MG disintegrating tablet Take 1 tablet (4 mg total) by mouth every 8 (eight) hours as needed for nausea or vomiting. Patient not taking: Reported on 05/14/2017 05/12/17   Steele Sizer, MD  Vitamin D, Ergocalciferol, (DRISDOL) 50000 units CAPS capsule Take 1 capsule (50,000 Units total) by mouth every 7 (seven) days. Patient not taking: Reported on 05/14/2017 07/20/16   Steele Sizer, MD    Family History Family History  Problem Relation Age of Onset  . Hypertension Father   . Hyperlipidemia Father   . Hyperlipidemia Mother   . Hypertension Mother   . Hypertension Paternal Aunt   . Cancer Maternal Grandmother        breast    Social History Social History  Substance Use Topics  . Smoking status: Never Smoker  . Smokeless tobacco: Never Used  . Alcohol use No     Allergies   Imitrex [sumatriptan] and Topamax [topiramate]   Review of Systems Review of Systems   Physical Exam Updated Vital Signs BP (!) 159/82   Pulse 64   Temp 97.7 F (36.5 C) (Oral)   Resp 16   Ht 5\' 7"  (1.702 m)   Wt 130.2 kg (287 lb)   SpO2 100%   BMI 44.95 kg/m    Physical Exam  Constitutional: She is oriented to person, place, and time. She appears well-developed and well-nourished.  Sitting comfortably on examination table  HENT:  Head: Normocephalic and atraumatic.  Mouth/Throat: Uvula is midline, oropharynx is clear and moist and mucous membranes are normal.  Eyes: Pupils are equal, round, and reactive to light. Conjunctivae and lids are normal. Right conjunctiva is not injected. Right conjunctiva has no hemorrhage. Left conjunctiva is not injected. Left conjunctiva has no hemorrhage. Right eye exhibits no nystagmus. Left eye exhibits abnormal extraocular motion. Left eye exhibits no nystagmus.  Questionable periorbital edema of eyelid, though eyelids appear to be symmetric May be the way the eyelid is laying and that patient will not open it. No overlying erythema or warmth. Mild tenderness to palpation of left periorbital region. EOMs of right eye intact. Full EOMs of left eye intact but patient reports pain.  IOP: R 14, 15, 17 IOP: L 21, 17, 15  Neck: Full  passive range of motion without pain.  Full flexion/extension and lateral movement of neck fully intact. No bony midline tenderness. No deformities or crepitus.   Cardiovascular: Normal rate, regular rhythm, normal heart sounds and normal pulses.  Exam reveals no gallop and no friction rub.   No murmur heard. Pulmonary/Chest: Effort normal and breath sounds normal.  Abdominal: Soft. Normal appearance. There is no tenderness. There is no rigidity and no guarding.  Musculoskeletal: Normal range of motion.  Neurological: She is alert and oriented to person, place, and time. GCS eye subscore is 4. GCS verbal subscore is 5. GCS motor subscore is 6.  Cranial nerves III-XII intact except that patient will not open left eye. She is able to close it tightly without difficulty.  Follows commands, Moves all extremities  5/5 strength to BUE and BLE  Sensation intact throughout all major nerve  distributions Normal finger to nose. No dysdiadochokinesia. No pronator drift. No gait abnormalities  Slight unbalance with Romberg No slurred speech. No facial droop.   Skin: Skin is warm and dry. Capillary refill takes less than 2 seconds.  Psychiatric: She has a normal mood and affect. Her speech is normal.  Nursing note and vitals reviewed.    ED Treatments / Results  Labs (all labs ordered are listed, but only abnormal results are displayed) Labs Reviewed  BASIC METABOLIC PANEL  CBC WITH DIFFERENTIAL/PLATELET  I-STAT BETA HCG BLOOD, ED ([facility], [facility], [facility] ONLY)    EKG  EKG Interpretation  Date/Time:  Sunday May 14 2017 17:02:02 EDT Ventricular Rate:  62 PR Interval:    QRS Duration: 84 QT Interval:  421 QTC Calculation: 428 R Axis:   36 Text Interpretation:  Sinus rhythm LAE, consider biatrial enlargement Borderline T abnormalities, anterior leads since last tracing no significant change Confirmed by Malvin Johns [phone]) on 05/14/2017 5:20:37 PM Also confirmed by Malvin Johns [phone]), editor Radene Gunning [phone])  on 05/15/2017 8:06:52 AM       Radiology Ct Head Wo Contrast  Addendum Date: 05/14/2017   ADDENDUM REPORT: 05/14/2017 17:26 ADDENDUM: It Should be noted that the entirety of the orbits were included on the CT of the head. No orbital wall fracture is identified. The globes are within normal limits. No pre or postseptal abnormality is identified. Electronically Signed   By: Inez Catalina M.D.   On: 05/14/2017 17:26   Result Date: 05/14/2017 CLINICAL DATA:  Facial droop on the left EXAM: CT HEAD WITHOUT CONTRAST TECHNIQUE: Contiguous axial images were obtained from the base of the skull through the vertex without intravenous contrast. COMPARISON:  02/14/2010 FINDINGS: Brain: No evidence of acute infarction, hemorrhage, hydrocephalus, extra-axial collection or mass lesion/mass effect. Vascular: No hyperdense vessel or unexpected calcification. Skull: Normal. Negative  for fracture or focal lesion. Sinuses/Orbits: No acute finding. Other: None. IMPRESSION: No acute abnormality noted. Electronically Signed: By: Inez Catalina M.D. On: 05/14/2017 16:26   Mr Brain Wo Contrast  Result Date: 05/14/2017 CLINICAL DATA:  46 year old female status post blunt trauma when 2 boxes fell on her head at work. Subsequent headache, nausea vomiting and facial numbness. EXAM: MRI HEAD WITHOUT CONTRAST TECHNIQUE: Multiplanar, multiecho pulse sequences of the brain and surrounding structures were obtained without intravenous contrast. COMPARISON:  Head CT without contrast 1611 hours today and earlier. FINDINGS: Brain: Normal cerebral volume. No restricted diffusion to suggest acute infarction. No midline shift, mass effect, evidence of mass lesion, ventriculomegaly, extra-axial collection or acute intracranial hemorrhage. Cervicomedullary junction and pituitary are within normal limits. Small  chronic lacunar type infarct in the mid right cerebellar hemisphere (series 8, image 6). Otherwise normal gray and white matter signal throughout the brain. No other encephalomalacia and no chronic cerebral blood products identified. Vascular: Major intracranial vascular flow voids are preserved. Skull and upper cervical spine: Negative. Visualized bone marrow signal is within normal limits. Sinuses/Orbits: Disc conjugate gaze but otherwise normal orbits soft tissues. Visualized paranasal sinuses and mastoids are stable and well pneumatized. Other: Visible internal auditory structures appear normal. Normal stylomastoid foramina. Scalp and face soft tissues appear negative. IMPRESSION: 1.  No acute intracranial abnormality. 2. There is a small chronic infarct in the right cerebellum, but otherwise normal noncontrast MRI appearance of the brain. Electronically Signed   By: Genevie Ann M.D.   On: 05/14/2017 21:07    Procedures Procedures (including critical care time)  Medications Ordered in ED Medications    sodium chloride 0.9 % bolus 1,000 mL (0 mLs Intravenous Stopped 05/14/17 1947)  metoCLOPramide (REGLAN) injection 10 mg (10 mg Intravenous Given 05/14/17 1847)  diphenhydrAMINE (BENADRYL) injection 25 mg (25 mg Intravenous Given 05/14/17 1847)  prochlorperazine (COMPAZINE) tablet 10 mg (10 mg Oral Given 05/14/17 1847)  ketorolac (TORADOL) 30 MG/ML injection 30 mg (30 mg Intravenous Given 05/14/17 1847)  tetracaine (PONTOCAINE) 0.5 % ophthalmic solution 1 drop (1 drop Left Eye Given 05/14/17 1847)     Initial Impression / Assessment and Plan / ED Course  I have reviewed the triage vital signs and the nursing notes.  Pertinent labs & imaging results that were available during my care of the patient were reviewed by me and considered in my medical decision making (see chart for details).     46 year old female who presents with complaints of persistent left-sided headache that has been ongoing for 3 days since 2 boxes fell on her head. She also reports some left-sided facial numbness, left facial drooping and difficulty opening left eye that began this morning at 9 AM. Has a history of migraines. Quality of headache is similar to migraines but location is different. Patient has difficulty opening left eye but is otherwise no neuro deficits. Consider migraine headache versus pain secondary to accident versus ICH versus CVA. Also consider preseptal cellulitis, though low suspicion. Very low suspicion for orbital cellulitis given history/physical exam. CT head ordered at triage. Will plan to order additional labs and imaging. Will plan to give migraine cocktail for treatment of migraine.   Discussed with Dr. Nevada Crane (Radiology). He provided an addendum to the reading the CT head to include findings of the orbit. Based on our discussion, do not need further CT orbit imaging. There is no evidence of orbital wall fracture or orbital cellulitis.  Labs reviewed. BMP is unremarkable. CBC unremarkable. Discussed  results with patient. Patient states that she is still having a slight headache but it has improved significantly. Patient is still unable to open her left eye. Given symptoms and combined with slightly unsteady Romberg, will plan to MRI brain for evaluation of acute CVA. Discussed patient with Dr. Tamera Punt. Agrees with plan.   Reevaluation of patient. She reports improvement in headache. Patient now down to a 3/10. MRI is pending.  Patient signed out to Will Dansie, PA-C with MRI of the brain pending. If MRI is normal, patient can be discharged home with appropriate follow-up.   Updated patient on plan. As he went in the room to discuss with patient, her eye was completely open without any difficulty. Patient has no difficulty moving the left eyelid.  There is no evidence of edema, redness, tenderness. Will not plan to treat for preseptal cellulitis at this time.   Final Clinical Impressions(s) / ED Diagnoses   Final diagnoses:  Acute nonintractable headache, unspecified headache type  Eye pain, left  Paresthesia    New Prescriptions Discharge Medication List as of 05/14/2017  9:44 PM       Volanda Napoleon, PA-C 05/15/17 1818    Malvin Johns, MD 05/15/17 2252

## 2017-05-14 NOTE — ED Triage Notes (Signed)
Onset 3 days ago pt was sitting in break room and two boxes fell on head, first box was light, second box bigger and heavier.  Pt immediately got headache.  Pt was sent for drug testing, eval, and told to return to work.  Pt seen PCP next day for headache, feeling like left side of face is heavy, eye not wanting to open wide, and numbness.

## 2017-05-14 NOTE — ED Notes (Signed)
Tetracaine drops at the bedside

## 2017-05-14 NOTE — ED Notes (Signed)
Pt reports at work on [employer] two boxes fell on the top of her head and she has since been experiencing L sided head and face pain and today she woke up and noticed the L side of her face felt like it was drooping and she could not open her eye very well. Pt seen PCP on Friday, no further tests performed, pt given something for the pain. Pt A&Ox4, PERRLA. Pt with difficulty opening L eye, no blurry vision or loss of vision.

## 2017-05-14 NOTE — ED Notes (Addendum)
Mendel Ryder, [place] aware of pt BP.

## 2017-05-14 NOTE — Discharge Instructions (Addendum)
Please follow up with primary care to discuss being on daily aspirin.  Follow-up with referred neurology as needed.   Follow-up with your primary care doctor in the next 24-48 hours for further evaluation.  Return the emergency department for any worsening headache, vomiting, chest pain, difficulty breathing, numbness/weakness of the arms or legs, vision changes or any other worsening or concerning symptoms.

## 2017-05-14 NOTE — ED Notes (Signed)
Patient transported to MRI 

## 2017-05-16 ENCOUNTER — Telehealth: Payer: Self-pay | Admitting: Family Medicine

## 2017-05-16 NOTE — Telephone Encounter (Signed)
Faxed note to patient.

## 2017-05-16 NOTE — Telephone Encounter (Signed)
Pt has misplaced her work note from Friday. Please fax to [phone]

## 2017-06-11 ENCOUNTER — Other Ambulatory Visit: Payer: Self-pay | Admitting: Family Medicine

## 2017-06-11 DIAGNOSIS — I10 Essential (primary) hypertension: Secondary | ICD-10-CM

## 2017-06-12 ENCOUNTER — Ambulatory Visit (INDEPENDENT_AMBULATORY_CARE_PROVIDER_SITE_OTHER): Payer: 59 | Admitting: Family Medicine

## 2017-06-12 ENCOUNTER — Encounter: Payer: Self-pay | Admitting: Family Medicine

## 2017-06-12 VITALS — BP 138/90 | HR 59 | Wt 289.3 lb

## 2017-06-12 DIAGNOSIS — Z6841 Body Mass Index (BMI) 40.0 and over, adult: Secondary | ICD-10-CM | POA: Diagnosis not present

## 2017-06-12 DIAGNOSIS — G43009 Migraine without aura, not intractable, without status migrainosus: Secondary | ICD-10-CM | POA: Diagnosis not present

## 2017-06-12 DIAGNOSIS — I10 Essential (primary) hypertension: Secondary | ICD-10-CM | POA: Diagnosis not present

## 2017-06-12 DIAGNOSIS — Z23 Encounter for immunization: Secondary | ICD-10-CM

## 2017-06-12 DIAGNOSIS — E785 Hyperlipidemia, unspecified: Secondary | ICD-10-CM | POA: Diagnosis not present

## 2017-06-12 DIAGNOSIS — E1169 Type 2 diabetes mellitus with other specified complication: Secondary | ICD-10-CM | POA: Diagnosis not present

## 2017-06-12 DIAGNOSIS — Z8673 Personal history of transient ischemic attack (TIA), and cerebral infarction without residual deficits: Secondary | ICD-10-CM | POA: Diagnosis not present

## 2017-06-12 DIAGNOSIS — E669 Obesity, unspecified: Secondary | ICD-10-CM | POA: Diagnosis not present

## 2017-06-12 DIAGNOSIS — E063 Autoimmune thyroiditis: Secondary | ICD-10-CM

## 2017-06-12 DIAGNOSIS — E559 Vitamin D deficiency, unspecified: Secondary | ICD-10-CM | POA: Diagnosis not present

## 2017-06-12 LAB — POCT GLYCOSYLATED HEMOGLOBIN (HGB A1C): Hemoglobin A1C: 6

## 2017-06-12 MED ORDER — CLONIDINE HCL 0.2 MG PO TABS: 0.2000 mg | ORAL_TABLET | Freq: Every day | ORAL | 5 refills | Status: DC

## 2017-06-12 MED ORDER — IBUPROFEN 800 MG PO TABS: 800.0000 mg | ORAL_TABLET | Freq: Three times a day (TID) | ORAL | 0 refills | Status: DC | PRN

## 2017-06-12 MED ORDER — LOSARTAN POTASSIUM-HCTZ 100-25 MG PO TABS: 1.0000 | ORAL_TABLET | Freq: Every day | ORAL | 1 refills | Status: DC

## 2017-06-12 MED ORDER — ATORVASTATIN CALCIUM 40 MG PO TABS: 40.0000 mg | ORAL_TABLET | Freq: Every day | ORAL | 3 refills | Status: DC

## 2017-06-12 MED ORDER — HYDRALAZINE HCL 50 MG PO TABS: 50.0000 mg | ORAL_TABLET | Freq: Two times a day (BID) | ORAL | 5 refills | Status: DC

## 2017-06-12 MED ORDER — VITAMIN D 50 MCG (2000 UT) PO CAPS: 1.0000 | ORAL_CAPSULE | Freq: Every day | ORAL | 0 refills | Status: DC

## 2017-06-12 MED ORDER — ASPIRIN EC 81 MG PO TBEC: 81.0000 mg | DELAYED_RELEASE_TABLET | Freq: Every day | ORAL | 0 refills | Status: DC

## 2017-06-12 MED ORDER — NADOLOL 40 MG PO TABS: 40.0000 mg | ORAL_TABLET | Freq: Two times a day (BID) | ORAL | 5 refills | Status: DC

## 2017-06-12 NOTE — Progress Notes (Signed)
Name: Tiffany Nunez   MRN: [id number]    DOB: 03/30/1971   Date:06/12/2017       Progress Note  Subjective  Chief Complaint  Chief Complaint  Patient presents with  . Diabetes  . Hypertension  . Hypothyroidism    HPI  DMII : Last hgbA1C was 5.9% today was 6.2 % , down to 6%. .We stopped Metformin in 2017 because of diarrhea. She is on Trulicity since Fall 4401 and was tolerating well, but got tired of giving herself injections weekly, so she stopped it a couple of months ago. She denies polyphagia, polydipsia or polyuria. She states she has not been compliant with diet or exercise lately, she has gained some weight. She is drinking sodas at least once a day again.   Migraine headaches: episodes of migraine has increased since she had an injury at work ( a box fell on her head 05/11/17), initially episodes were daily, but now about 4 times a week, and radiates down left side of face.  She is able to work when she has an episode - most of the time, takes medication Ibuprofen of Fioricet prn. Unable to tolerate triptans. Migraine is described as frontal pain, throbbing like, associated with photophobia and nausea, sometimes vomiting. She described the aura as scotomas. She was advised to get workmans comp to re-evaluate, or we can send her to neurologist if needed for further evaluation. She had CT head and MRI done at [facility]. MRI showed chronic infarct of cerebellum.   Obesity: she gained 2 lbs since last visit, she states she has been eating and drinking everything.   Hypothyroidism: she stopped seeing [name] and has been out of Synthroid for months. She has been feeling very tired.  HTN: bp is not controlled, she denies chest pain or palpitation. She is compliant with medication, but did not take medication this morning. She took it in our office after we checked her bp.   Patient Active Problem List   Diagnosis Date Noted  . Acquired autoimmune hypothyroidism 11/09/2016  . Elevated  TSH 07/20/2016  . Vitamin D deficiency 05/03/2015  . Dyslipidemia 05/03/2015  . Gastro-esophageal reflux disease without esophagitis 05/03/2015  . Alopecia 05/03/2015  . Genital herpes 05/03/2015  . Dysmetabolic syndrome 02/72/5366  . Positive H. pylori test 05/03/2015  . Neuralgia neuritis, sciatic nerve 05/03/2015  . Migraine without aura and responsive to treatment 04/30/2014  . HTN (hypertension) 01/22/2014  . Morbid obesity (Uncertain) 01/22/2014  . Diabetes mellitus type 2 in obese ([facility]) 01/22/2014  . Decreased cardiac ejection fraction 01/22/2014    Past Surgical History:  Procedure Laterality Date  . ABDOMINAL HYSTERECTOMY    . ANKLE SURGERY     right  . TUBAL LIGATION      Family History  Problem Relation Age of Onset  . Hypertension Father   . Hyperlipidemia Father   . Hyperlipidemia Mother   . Hypertension Mother   . Hypertension Paternal Aunt   . Cancer Maternal Grandmother        breast    Social History   Social History  . Marital status: Divorced    Spouse name: N/A  . Number of children: N/A  . Years of education: N/A   Occupational History  . Not on file.   Social History Main Topics  . Smoking status: Never Smoker  . Smokeless tobacco: Never Used  . Alcohol use No  . Drug use: No  . Sexual activity: No   Other Topics  Concern  . Not on file   Social History Narrative  . No narrative on file     Current Outpatient Prescriptions:  .  Butalbital-APAP-Caffeine 50-300-40 MG CAPS, Take 1 capsule by mouth every 6 (six) hours as needed., Disp: 60 capsule, Rfl: 0 .  cloNIDine (CATAPRES) 0.2 MG tablet, Take 1 tablet (0.2 mg total) by mouth at bedtime., Disp: 30 tablet, Rfl: 5 .  Dulaglutide (TRULICITY) 1.5 VF/6.4PP SOPN, Inject 1.5 mg into the skin once a week., Disp: 4 pen, Rfl: 2 .  hydrALAZINE (APRESOLINE) 50 MG tablet, Take 1 tablet (50 mg total) by mouth 2 (two) times daily., Disp: 60 tablet, Rfl: 5 .  ibuprofen (ADVIL,MOTRIN) 800 MG tablet,  Take 1 tablet (800 mg total) by mouth 3 (three) times daily as needed for headache., Disp: 90 tablet, Rfl: 0 .  levothyroxine (SYNTHROID) 25 MCG tablet, Take 1 tablet (25 mcg total) by mouth daily before breakfast., Disp: 30 tablet, Rfl: 3 .  nadolol (CORGARD) 40 MG tablet, Take 1 tablet (40 mg total) by mouth 2 (two) times daily., Disp: 60 tablet, Rfl: 5 .  ondansetron (ZOFRAN-ODT) 4 MG disintegrating tablet, Take 1 tablet (4 mg total) by mouth every 8 (eight) hours as needed for nausea or vomiting., Disp: 20 tablet, Rfl: 0 .  Cholecalciferol (VITAMIN D) 2000 units CAPS, Take 1 capsule (2,000 Units total) by mouth daily., Disp: 30 capsule, Rfl: 0 .  losartan-hydrochlorothiazide (HYZAAR) 100-25 MG tablet, Take 1 tablet by mouth daily., Disp: 90 tablet, Rfl: 1  Allergies  Allergen Reactions  . Imitrex [Sumatriptan] Anaphylaxis  . Topamax [Topiramate] Other (See Comments)    Caused hand numbness      ROS  Constitutional: Negative for fever or weight change.  Respiratory: Negative for cough and shortness of breath.   Cardiovascular: Negative for chest pain or palpitations.  Gastrointestinal: Negative for abdominal pain, no bowel changes.  Musculoskeletal: Negative for gait problem or joint swelling.  Skin: Negative for rash.  Neurological: Negative for dizziness or headache.  No other specific complaints in a complete review of systems (except as listed in HPI above).  Objective  Vitals:   06/12/17 0749  BP: (!) 140/100  Pulse: (!) 59  SpO2: 97%  Weight: 289 lb 4.8 oz (131.2 kg)    Body mass index is 45.31 kg/m.  Physical Exam  Constitutional: Patient appears well-developed and well-nourished. Obese  No distress.  HEENT: head atraumatic, normocephalic, pupils equal and reactive to light, neck supple, throat within normal limits Cardiovascular: Normal rate, regular rhythm and normal heart sounds.  No murmur heard. No BLE edema. Pulmonary/Chest: Effort normal and breath sounds  normal. No respiratory distress. Abdominal: Soft.  There is no tenderness. Psychiatric: Patient has a normal mood and affect. behavior is normal. Judgment and thought content normal. Neurological: no focal deficit, cranial nerves intact, Romberg negative  Recent Results (from the past 2160 hour(s))  Basic metabolic panel     Status: None   Collection Time: 05/14/17  4:57 PM  Result Value Ref Range   Sodium 138 135 - 145 mmol/L   Potassium 3.5 3.5 - 5.1 mmol/L   Chloride 102 101 - 111 mmol/L   CO2 27 22 - 32 mmol/L   Glucose, Bld 97 65 - 99 mg/dL   BUN 10 6 - 20 mg/dL   Creatinine, Ser 0.88 0.44 - 1.00 mg/dL   Calcium 9.9 8.9 - 10.3 mg/dL   GFR calc non Af Amer >60 >60 mL/min   GFR calc  Af Amer >60 >60 mL/min    Comment: (NOTE) The eGFR has been calculated using the CKD EPI equation. This calculation has not been validated in all clinical situations. eGFR's persistently <60 mL/min signify possible Chronic Kidney Disease.    Anion gap 9 5 - 15  CBC with Differential     Status: None   Collection Time: 05/14/17  4:57 PM  Result Value Ref Range   WBC 7.0 4.0 - 10.5 K/uL   RBC 4.79 3.87 - 5.11 MIL/uL   Hemoglobin 14.0 12.0 - 15.0 g/dL   HCT 42.8 36.0 - 46.0 %   MCV 89.4 78.0 - 100.0 fL   MCH 29.2 26.0 - 34.0 pg   MCHC 32.7 30.0 - 36.0 g/dL   RDW 14.3 11.5 - 15.5 %   Platelets 393 150 - 400 K/uL   Neutrophils Relative % 56 %   Neutro Abs 3.9 1.7 - 7.7 K/uL   Lymphocytes Relative 36 %   Lymphs Abs 2.5 0.7 - 4.0 K/uL   Monocytes Relative 7 %   Monocytes Absolute 0.5 0.1 - 1.0 K/uL   Eosinophils Relative 1 %   Eosinophils Absolute 0.0 0.0 - 0.7 K/uL   Basophils Relative 0 %   Basophils Absolute 0.0 0.0 - 0.1 K/uL  I-Stat Beta hCG blood, ED ([facility], [facility], [facility] only)     Status: None   Collection Time: 05/14/17  5:07 PM  Result Value Ref Range   I-stat hCG, quantitative <5.0 <5 mIU/mL   Comment 3            Comment:   GEST. AGE      CONC.  (mIU/mL)   <=1 WEEK        5 - 50     2  WEEKS       50 - 500     3 WEEKS       100 - 10,000     4 WEEKS     1,000 - 30,000        FEMALE AND NON-PREGNANT FEMALE:     LESS THAN 5 mIU/mL     Diabetic Foot Exam: Diabetic Foot Exam - Simple   Simple Foot Form Diabetic Foot exam was performed with the following findings:  Yes 06/12/2017  8:06 AM  Visual Inspection No deformities, no ulcerations, no other skin breakdown bilaterally:  Yes Sensation Testing Intact to touch and monofilament testing bilaterally:  Yes Pulse Check Posterior Tibialis and Dorsalis pulse intact bilaterally:  Yes Comments      PHQ2/9: Depression screen [facility] 2/9 11/09/2016 07/21/2016 07/19/2016 07/08/2016 04/12/2016  Decreased Interest 0 0 0 0 0  Down, Depressed, Hopeless 0 0 0 0 0  PHQ - 2 Score 0 0 0 0 0     Fall Risk: Fall Risk  11/09/2016 07/21/2016 07/19/2016 07/08/2016 04/12/2016  Falls in the past year? _0       Assessment & Plan  1. Diabetes mellitus type 2 in obese ([facility])  - POCT HgB A1C  2. Migraine without aura and responsive to treatment  - ibuprofen (ADVIL,MOTRIN) 800 MG tablet; Take 1 tablet (800 mg total) by mouth 3 (three) times daily as needed for headache.  Dispense: 90 tablet; Refill: 0  3. Morbid obesity, unspecified obesity type [facility])  Discussed with the patient the risk posed by an increased BMI. Discussed importance of portion control, calorie counting and at least 150 minutes of physical activity weekly. Avoid sweet beverages and drink more water.  Eat at least 6 servings of fruit and vegetables daily  She stopped Trulicity   4. Essential hypertension  bp is not controlled, we will check labs and consider referral to nephrologist. We will also increase dose of HCTZ, she is under a lot of stress because of worksman comp problems at work  - cloNIDine (CATAPRES) 0.2 MG tablet; Take 1 tablet (0.2 mg total) by mouth at bedtime.  Dispense: 30 tablet; Refill: 5 - hydrALAZINE (APRESOLINE) 50 MG tablet; Take 1 tablet  (50 mg total) by mouth 2 (two) times daily.  Dispense: 60 tablet; Refill: 5 - nadolol (CORGARD) 40 MG tablet; Take 1 tablet (40 mg total) by mouth 2 (two) times daily.  Dispense: 60 tablet; Refill: 5 - losartan-hydrochlorothiazide (HYZAAR) 100-25 MG tablet; Take 1 tablet by mouth daily.  Dispense: 90 tablet; Refill: 1 - Microalbumin / creatinine urine ratio - COMPLETE METABOLIC PANEL WITH GFR - CBC with Differential/Platelet  5. Acquired autoimmune hypothyroidism  - TSH  6. Dyslipidemia  - Lipid panel  7. Vitamin D deficiency  - Cholecalciferol (VITAMIN D) 2000 units CAPS; Take 1 capsule (2,000 Units total) by mouth daily.  Dispense: 30 capsule; Refill: 0 - VITAMIN D 25 Hydroxy (Vit-D Deficiency, Fractures)  8. History of cerebrovascular accident (CVA) involving cerebellum  Found on MRI brain done 04/2017, we will start statin therapy, discussed aspirin 81 mg daily, it may have been secondary to migraines  9. Needs flu shot  - Flu Vaccine QUAD 6+ mos PF IM (Fluarix Quad PF)

## 2017-06-13 ENCOUNTER — Other Ambulatory Visit: Payer: Self-pay | Admitting: Family Medicine

## 2017-06-13 LAB — COMPLETE METABOLIC PANEL WITH GFR
AG Ratio: 1.3 (calc) (ref 1.0–2.5)
ALBUMIN MSPROF: 3.7 g/dL (ref 3.6–5.1)
ALT: 29 U/L (ref 6–29)
AST: 18 U/L (ref 10–35)
Alkaline phosphatase (APISO): 80 U/L (ref 33–115)
BUN: 14 mg/dL (ref 7–25)
CALCIUM: 9.3 mg/dL (ref 8.6–10.2)
CO2: 29 mmol/L (ref 20–32)
CREATININE: 0.88 mg/dL (ref 0.50–1.10)
Chloride: 102 mmol/L (ref 98–110)
GFR, EST AFRICAN AMERICAN: 91 mL/min/{1.73_m2} (ref >=60)
GFR, Est Non African American: 79 mL/min/{1.73_m2} (ref >=60)
GLUCOSE: 116 mg/dL — AB (ref 65–99)
Globulin: 2.9 g/dL (calc) (ref 1.9–3.7)
Potassium: 3.6 mmol/L (ref 3.5–5.3)
Sodium: 139 mmol/L (ref 135–146)
TOTAL PROTEIN: 6.6 g/dL (ref 6.1–8.1)
Total Bilirubin: 0.4 mg/dL (ref 0.2–1.2)

## 2017-06-13 LAB — LIPID PANEL
CHOL/HDL RATIO: 3.3 (calc) (ref <5.0)
Cholesterol: 166 mg/dL (ref <200)
HDL: 51 mg/dL (ref >50)
LDL CHOLESTEROL (CALC): 96 mg/dL
Non-HDL Cholesterol (Calc): 115 mg/dL (calc) (ref <130)
TRIGLYCERIDES: 97 mg/dL (ref <150)

## 2017-06-13 LAB — MICROALBUMIN / CREATININE URINE RATIO
CREATININE, URINE: 216 mg/dL (ref 20–275)
MICROALB/CREAT RATIO: 7 ug/mg{creat} (ref <30)
Microalb, Ur: 1.6 mg/dL

## 2017-06-13 LAB — TSH: TSH: 3.33 m[IU]/L

## 2017-06-13 LAB — VITAMIN D 25 HYDROXY (VIT D DEFICIENCY, FRACTURES): VIT D 25 HYDROXY: 10 ng/mL — AB (ref 30–100)

## 2017-06-13 MED ORDER — VITAMIN D (ERGOCALCIFEROL) 1.25 MG (50000 UNIT) PO CAPS: 50000.0000 [IU] | ORAL_CAPSULE | ORAL | 0 refills | Status: DC

## 2017-07-10 ENCOUNTER — Other Ambulatory Visit: Payer: Self-pay

## 2017-07-10 DIAGNOSIS — Z8673 Personal history of transient ischemic attack (TIA), and cerebral infarction without residual deficits: Secondary | ICD-10-CM

## 2017-07-10 DIAGNOSIS — G43009 Migraine without aura, not intractable, without status migrainosus: Secondary | ICD-10-CM

## 2017-07-10 MED ORDER — ASPIRIN EC 81 MG PO TBEC: 81.0000 mg | DELAYED_RELEASE_TABLET | Freq: Every day | ORAL | 0 refills | Status: DC

## 2017-07-10 MED ORDER — IBUPROFEN 800 MG PO TABS: 800.0000 mg | ORAL_TABLET | Freq: Three times a day (TID) | ORAL | 0 refills | Status: DC | PRN

## 2017-07-10 NOTE — Telephone Encounter (Signed)
Refill request for general medication of Aspirin and Ibuprofen for 90 day supplies to [facility] in [place].  Last office visit: 06/12/2017.  Next visit: 09/20/2017.

## 2017-07-24 ENCOUNTER — Other Ambulatory Visit: Payer: Self-pay

## 2017-07-24 MED ORDER — VITAMIN D (ERGOCALCIFEROL) 1.25 MG (50000 UNIT) PO CAPS: 50000.0000 [IU] | ORAL_CAPSULE | ORAL | 0 refills | Status: DC

## 2017-07-24 NOTE — Telephone Encounter (Signed)
Refill request for general medication for Vitamin D 50,000 units  Last office visit: 06/12/2017

## 2017-07-26 ENCOUNTER — Other Ambulatory Visit: Payer: Self-pay

## 2017-07-26 MED ORDER — VITAMIN D (ERGOCALCIFEROL) 1.25 MG (50000 UNIT) PO CAPS: 50000.0000 [IU] | ORAL_CAPSULE | ORAL | 0 refills | Status: DC

## 2017-07-26 NOTE — Telephone Encounter (Addendum)
Refill request for general medication:   Vitamin D 1.25 mg 50,000 units  Last vitamin D: 10  Last office visit: 06/12/2017  Last physical exam: 07/21/2016  Follow up visit: 09/20/2017

## 2017-08-14 ENCOUNTER — Ambulatory Visit: Payer: 59 | Admitting: Family Medicine

## 2017-08-14 ENCOUNTER — Encounter: Payer: Self-pay | Admitting: Family Medicine

## 2017-08-14 VITALS — BP 150/100 | HR 66 | Resp 14 | Ht 67.0 in | Wt 287.2 lb

## 2017-08-14 DIAGNOSIS — I10 Essential (primary) hypertension: Secondary | ICD-10-CM | POA: Diagnosis not present

## 2017-08-14 DIAGNOSIS — E669 Obesity, unspecified: Secondary | ICD-10-CM | POA: Diagnosis not present

## 2017-08-14 DIAGNOSIS — E039 Hypothyroidism, unspecified: Secondary | ICD-10-CM | POA: Diagnosis not present

## 2017-08-14 DIAGNOSIS — E559 Vitamin D deficiency, unspecified: Secondary | ICD-10-CM | POA: Diagnosis not present

## 2017-08-14 DIAGNOSIS — E1169 Type 2 diabetes mellitus with other specified complication: Secondary | ICD-10-CM | POA: Diagnosis not present

## 2017-08-14 DIAGNOSIS — R059 Cough, unspecified: Secondary | ICD-10-CM

## 2017-08-14 DIAGNOSIS — G44311 Acute post-traumatic headache, intractable: Secondary | ICD-10-CM

## 2017-08-14 DIAGNOSIS — G43119 Migraine with aura, intractable, without status migrainosus: Secondary | ICD-10-CM | POA: Diagnosis not present

## 2017-08-14 DIAGNOSIS — R05 Cough: Secondary | ICD-10-CM | POA: Diagnosis not present

## 2017-08-14 DIAGNOSIS — Z8673 Personal history of transient ischemic attack (TIA), and cerebral infarction without residual deficits: Secondary | ICD-10-CM

## 2017-08-14 MED ORDER — BENZONATATE 100 MG PO CAPS: 100.0000 mg | ORAL_CAPSULE | Freq: Three times a day (TID) | ORAL | 0 refills | Status: DC | PRN

## 2017-08-14 MED ORDER — ASPIRIN EC 81 MG PO TBEC: 81.0000 mg | DELAYED_RELEASE_TABLET | Freq: Every day | ORAL | 2 refills | Status: DC

## 2017-08-14 MED ORDER — ERENUMAB-AOOE 70 MG/ML ~~LOC~~ SOAJ: 70.0000 mg | SUBCUTANEOUS | 2 refills | Status: DC

## 2017-08-14 MED ORDER — VITAMIN D 50 MCG (2000 UT) PO CAPS: 1.0000 | ORAL_CAPSULE | Freq: Every day | ORAL | 2 refills | Status: DC

## 2017-08-14 MED ORDER — BUTALBITAL-APAP-CAFFEINE 50-300-40 MG PO CAPS: 1.0000 | ORAL_CAPSULE | Freq: Four times a day (QID) | ORAL | 0 refills | Status: DC | PRN

## 2017-08-14 NOTE — Progress Notes (Signed)
Name: Tiffany Nunez   MRN: [id number]    DOB: 1971/04/20   Date:08/14/2017       Progress Note  Subjective  Chief Complaint  Chief Complaint  Patient presents with  . fmla    form completion  . Migraine  . Hypertension    HPI    DMII : Last hgbA1C was 5.9% today was 6.2 % , down to 6%. .We stopped Metformin in 2017 because of diarrhea. She was  on Trulicity since Fall 7124 and was tolerating well, but got tired of giving herself injections weekly and also stopped because of cost and stopped taking medications.  She denies polyphagia, polydipsia or polyuria.  Reminded her again to resume diet.  Migraine headaches: episodes of migraine has increased since she had an injury at work ( a box fell on her head 05/11/17), initially episodes were daily, but now about 4 times a week, and radiates down left side of face.  She is able to work when she has an episode - most of the time, takes medication Ibuprofen of Fioricet prn. Unable to tolerate triptans. Migraine is described as frontal pain, throbbing like, associated with photophobia and nausea, sometimes vomiting. She described the aura as scotomas. She is taking pain medication almost daily and is likely having rebound headaches, she brought FMLA papers to fill out, she states she tries to go to work daily, but sometimes the episodes are very intense and misses 3 days in a row. We will place referral to neurologist . She had CT head and MRI done at [facility]. MRI showed chronic infarct of cerebellum.   Hypothyroidism: she stopped seeing [name] and has been out of Synthroid for months. Last TSH was back to normal we will recheck labs today because of fatigue  HTN: bp is not controlled, she denies chest pain or palpitation. She skipped medication this morning and is stressed out, her daughter is struggling with a Pharmacist, hospital, her two daughters were fighting during the holidays.   Obesity: off metformin and Trulicity, still drinking sodas.   Cough:  symptoms about 2 weeks ago, she states never had cold symptoms, just a cough, usually dry but sometimes productive, she states TMax was yesterday at 100, she has also noticed mild SOB and some rattling in her chest. No sick contacts at home OTC medication are not helping with cough.    Patient Active Problem List   Diagnosis Date Noted  . History of cerebrovascular accident (CVA) involving cerebellum 06/12/2017  . Acquired autoimmune hypothyroidism 11/09/2016  . Elevated TSH 07/20/2016  . Vitamin D deficiency 05/03/2015  . Dyslipidemia 05/03/2015  . Gastro-esophageal reflux disease without esophagitis 05/03/2015  . Alopecia 05/03/2015  . Genital herpes 05/03/2015  . Dysmetabolic syndrome 58/05/9832  . Positive H. pylori test 05/03/2015  . Neuralgia neuritis, sciatic nerve 05/03/2015  . Migraine without aura and responsive to treatment 04/30/2014  . HTN (hypertension) 01/22/2014  . Morbid obesity ([place]) 01/22/2014  . Diabetes mellitus type 2 in obese ([place]) 01/22/2014  . Decreased cardiac ejection fraction 01/22/2014    Past Surgical History:  Procedure Laterality Date  . ABDOMINAL HYSTERECTOMY    . ANKLE SURGERY     right  . TUBAL LIGATION      Family History  Problem Relation Age of Onset  . Hypertension Father   . Hyperlipidemia Father   . Hyperlipidemia Mother   . Hypertension Mother   . Hypertension Paternal Aunt   . Cancer Maternal Grandmother  breast    Social History   Socioeconomic History  . Marital status: Divorced    Spouse name: Not on file  . Number of children: Not on file  . Years of education: Not on file  . Highest education level: Not on file  Social Needs  . Financial resource strain: Not on file  . Food insecurity - worry: Not on file  . Food insecurity - inability: Not on file  . Transportation needs - medical: Not on file  . Transportation needs - non-medical: Not on file  Occupational History  . Not on file  Tobacco Use  . Smoking  status: Never Smoker  . Smokeless tobacco: Never Used  Substance and Sexual Activity  . Alcohol use: No    Alcohol/week: 0.0 oz  . Drug use: No  . Sexual activity: No  Other Topics Concern  . Not on file  Social History Narrative  . Not on file     Current Outpatient Medications:  .  aspirin EC 81 MG tablet, Take 1 tablet (81 mg total) by mouth daily., Disp: 100 tablet, Rfl: 2 .  atorvastatin (LIPITOR) 40 MG tablet, Take 1 tablet (40 mg total) by mouth daily., Disp: 90 tablet, Rfl: 3 .  Butalbital-APAP-Caffeine 50-300-40 MG CAPS, Take 1 capsule by mouth every 6 (six) hours as needed., Disp: 60 capsule, Rfl: 0 .  Cholecalciferol (VITAMIN D) 2000 units CAPS, Take 1 capsule (2,000 Units total) by mouth daily., Disp: 100 capsule, Rfl: 2 .  cloNIDine (CATAPRES) 0.2 MG tablet, Take 1 tablet (0.2 mg total) by mouth at bedtime., Disp: 30 tablet, Rfl: 5 .  hydrALAZINE (APRESOLINE) 50 MG tablet, Take 1 tablet (50 mg total) by mouth 2 (two) times daily., Disp: 60 tablet, Rfl: 5 .  ibuprofen (ADVIL,MOTRIN) 800 MG tablet, Take 1 tablet (800 mg total) by mouth 3 (three) times daily as needed for headache., Disp: 90 tablet, Rfl: 0 .  losartan-hydrochlorothiazide (HYZAAR) 100-25 MG tablet, Take 1 tablet by mouth daily., Disp: 90 tablet, Rfl: 1 .  nadolol (CORGARD) 40 MG tablet, Take 1 tablet (40 mg total) by mouth 2 (two) times daily., Disp: 60 tablet, Rfl: 5 .  ondansetron (ZOFRAN-ODT) 4 MG disintegrating tablet, Take 1 tablet (4 mg total) by mouth every 8 (eight) hours as needed for nausea or vomiting., Disp: 20 tablet, Rfl: 0 .  benzonatate (TESSALON) 100 MG capsule, Take 1-2 capsules (100-200 mg total) by mouth 3 (three) times daily as needed., Disp: 40 capsule, Rfl: 0  Allergies  Allergen Reactions  . Imitrex [Sumatriptan] Anaphylaxis  . Topamax [Topiramate] Other (See Comments)    Caused hand numbness      ROS  Constitutional: Negative for fever or weight change.  Respiratory: Positive   for cough but no shortness of breath.   Cardiovascular: Negative for chest pain or palpitations.  Gastrointestinal: Negative for abdominal pain, no bowel changes.  Musculoskeletal: Negative for gait problem or joint swelling.  Skin: Negative for rash.  Neurological: Negative for dizziness , positive for headaches.  No other specific complaints in a complete review of systems (except as listed in HPI above).  Objective  Vitals:   08/14/17 1319  BP: (!) 150/100  Pulse: 66  Resp: 14  SpO2: 98%  Weight: 287 lb 3.2 oz (130.3 kg)  Height: 5\' 7"  (1.702 m)    Body mass index is 44.98 kg/m.  Physical Exam  Constitutional: Patient appears well-developed and well-nourished. Obese No distress.  HEENT: head atraumatic, normocephalic, pupils equal  and reactive to light,  neck supple, throat within normal limits Cardiovascular: Normal rate, regular rhythm and normal heart sounds.  No murmur heard. No BLE edema. Pulmonary/Chest: Effort normal and breath sounds normal. No respiratory distress. Abdominal: Soft.  There is no tenderness. Psychiatric: Patient has a normal mood and affect. behavior is normal. Judgment and thought content normal. Neurological : no focal findings, cranial nerve intact, normal balance  Recent Results (from the past 2160 hour(s))  POCT HgB A1C     Status: Abnormal   Collection Time: 06/12/17  8:34 AM  Result Value Ref Range   Hemoglobin A1C 6.0   Microalbumin / creatinine urine ratio     Status: None   Collection Time: 06/12/17  8:45 AM  Result Value Ref Range   Creatinine, Urine 216 20 - 275 mg/dL   Microalb, Ur 1.6 mg/dL    Comment: Reference Range Not established    Microalb Creat Ratio 7 <30 mcg/mg creat    Comment: . The ADA defines abnormalities in albumin excretion as follows: Marland Kitchen Category         Result (mcg/mg creatinine) . Normal                    <30 Microalbuminuria         30-299  Clinical albuminuria   > OR = 300 . The ADA recommends that  at least two of three specimens collected within a 3-6 month period be abnormal before considering a patient to be within a diagnostic category.   COMPLETE METABOLIC PANEL WITH GFR     Status: Abnormal   Collection Time: 06/12/17  8:45 AM  Result Value Ref Range   Glucose, Bld 116 (H) 65 - 99 mg/dL    Comment: .            Fasting reference interval . For someone without known diabetes, a glucose value between 100 and 125 mg/dL is consistent with prediabetes and should be confirmed with a follow-up test. .    BUN 14 7 - 25 mg/dL   Creat 0.88 0.50 - 1.10 mg/dL   GFR, Est Non African American 79 > OR = 60 mL/min/1.21m2   GFR, Est African American 91 > OR = 60 mL/min/1.58m2   BUN/Creatinine Ratio NOT APPLICABLE 6 - 22 (calc)   Sodium 139 135 - 146 mmol/L   Potassium 3.6 3.5 - 5.3 mmol/L   Chloride 102 98 - 110 mmol/L   CO2 29 20 - 32 mmol/L   Calcium 9.3 8.6 - 10.2 mg/dL   Total Protein 6.6 6.1 - 8.1 g/dL   Albumin 3.7 3.6 - 5.1 g/dL   Globulin 2.9 1.9 - 3.7 g/dL (calc)   AG Ratio 1.3 1.0 - 2.5 (calc)   Total Bilirubin 0.4 0.2 - 1.2 mg/dL   Alkaline phosphatase (APISO) 80 33 - 115 U/L   AST 18 10 - 35 U/L   ALT 29 6 - 29 U/L  Lipid panel     Status: None   Collection Time: 06/12/17  8:45 AM  Result Value Ref Range   Cholesterol 166 <200 mg/dL   HDL 51 >50 mg/dL   Triglycerides 97 <150 mg/dL   LDL Cholesterol (Calc) 96 mg/dL (calc)    Comment: Reference range: <100 . Desirable range <100 mg/dL for primary prevention;   <70 mg/dL for patients with CHD or diabetic patients  with > or = 2 CHD risk factors. Marland Kitchen LDL-C is now calculated using the Martin-Hopkins  calculation,  which is a validated novel method providing  better accuracy than the Friedewald equation in the  estimation of LDL-C.  Cresenciano Genre et al. Annamaria Helling. 4166;063(01): 2061-2068  (http://education.[facility].com/faq/FAQ164)    Total CHOL/HDL Ratio 3.3 <5.0 (calc)   Non-HDL Cholesterol (Calc) 115 <130 mg/dL  (calc)    Comment: For patients with diabetes plus 1 major ASCVD risk  factor, treating to a non-HDL-C goal of <100 mg/dL  (LDL-C of <70 mg/dL) is considered a therapeutic  option.   VITAMIN D 25 Hydroxy (Vit-D Deficiency, Fractures)     Status: Abnormal   Collection Time: 06/12/17  8:45 AM  Result Value Ref Range   Vit D, 25-Hydroxy 10 (L) 30 - 100 ng/mL    Comment: Vitamin D Status         25-OH Vitamin D: . Deficiency:                    <20 ng/mL Insufficiency:             20 - 29 ng/mL Optimal:                 > or = 30 ng/mL . For 25-OH Vitamin D testing on patients on  D2-supplementation and patients for whom quantitation  of D2 and D3 fractions is required, the QuestAssureD(TM) 25-OH VIT D, (D2,D3), LC/MS/MS is recommended: order  code [phone] (patients >50yrs). . For more information on this test, go to: http://education.[facility].com/faq/FAQ163 (This link is being provided for  informational/educational purposes only.)   TSH     Status: None   Collection Time: 06/12/17  8:45 AM  Result Value Ref Range   TSH 3.33 mIU/L    Comment:           Reference Range .           > or = 20 Years  0.40-4.50 .                Pregnancy Ranges           First trimester    0.26-2.66           Second trimester   0.55-2.73           Third trimester    0.43-2.91     D  PHQ2/9: Depression screen [facility] 2/9 11/09/2016 07/21/2016 07/19/2016 07/08/2016 04/12/2016  Decreased Interest 0 0 0 0 0  Down, Depressed, Hopeless 0 0 0 0 0  PHQ - 2 Score 0 0 0 0 0    Fall Risk: Fall Risk  08/14/2017 11/09/2016 07/21/2016 07/19/2016 07/08/2016  Falls in the past year? No No No No No      Assessment & Plan  1. Migraine with aura, intractable, without status migrainosus   - Butalbital-APAP-Caffeine 50-300-40 MG CAPS; Take 1 capsule by mouth every 6 (six) hours as needed.  Dispense: 60 capsule; Refill: 0 - Ambulatory referral to Neurology - Erenumab-aooe (AIMOVIG) 70 MG/ML SOAJ; Inject  70 mg into the skin every 30 (thirty) days.  Dispense: 1 mL; Refill: 2 Could not tolerate Topamax side effects  2. History of cerebrovascular accident (CVA) involving cerebellum  - aspirin EC 81 MG tablet; Take 1 tablet (81 mg total) by mouth daily.  Dispense: 100 tablet; Refill: 2  3. Intractable acute post-traumatic headache  - Butalbital-APAP-Caffeine 50-300-40 MG CAPS; Take 1 capsule by mouth every 6 (six) hours as needed.  Dispense: 60 capsule; Refill: 0  4. Vitamin D deficiency  - Cholecalciferol (VITAMIN D) 2000  units CAPS; Take 1 capsule (2,000 Units total) by mouth daily.  Dispense: 100 capsule; Refill: 2  5. Essential hypertension  BP is not controlled today, but she forgot to take her medications this am  6. Cough  - benzonatate (TESSALON) 100 MG capsule; Take 1-2 capsules (100-200 mg total) by mouth 3 (three) times daily as needed.  Dispense: 40 capsule; Refill: 0  7. Morbid obesity, unspecified obesity type [facility])  She stopped Trulicity because of cost  8. Acquired hypothyroidism  Off medication, she does not like seeing Dr Dwyane Dee - TSH  9. Diabetes mellitus type 2 in obese [facility])  Doing well, off medications, last hgbA1C was 6.0 %

## 2017-08-15 ENCOUNTER — Other Ambulatory Visit: Payer: Self-pay | Admitting: Family Medicine

## 2017-08-15 LAB — TSH: TSH: 4.76 mIU/L — ABNORMAL HIGH

## 2017-08-15 MED ORDER — LEVOTHYROXINE SODIUM 50 MCG PO TABS: 25.0000 ug | ORAL_TABLET | ORAL | 0 refills | Status: DC

## 2017-08-31 ENCOUNTER — Ambulatory Visit: Payer: Self-pay | Admitting: Hematology

## 2017-08-31 NOTE — Telephone Encounter (Signed)
Patient states she "doesn't feel right", vomiting x2, sweating, shacking, short of breath, unsure if she has tingling in her arm, as she states she can't explain how she feels.Marland Kitchen  HX: HTN.  Patient's daughter at home with her.  Patient instructed to call 911. Patient verbalizes understanding. Reason for Disposition . [1] Chest pain lasts > 5 minutes AND [2] age > 44 AND [3] at least one cardiac risk factor (i.e., hypertension, diabetes, obesity, smoker or strong family history of heart disease)  Answer Assessment - Initial Assessment Questions 1. LOCATION: "Where does it hurt?"       Chest left side 2. RADIATION: "Does the pain go anywhere else?" (e.g., into neck, jaw, arms, back)     no 3. ONSET: "When did the chest pain begin?" (Minutes, hours or days)     30 4. PATTERN "Does the pain come and go, or has it been constant since it started?"  "Does it get worse with exertion?"      Constant 5. DURATION: "How long does it last" (e.g., seconds, minutes, hours)    30 minutes ago 6. SEVERITY: "How bad is the pain?"  (e.g., Scale 1-10; mild, moderate, or severe)    - MILD (1-3): doesn't interfere with normal activities     - MODERATE (4-7): interferes with normal activities or awakens from sleep    - SEVERE (8-10): excruciating pain, unable to do any normal activities   7 7. CARDIAC RISK FACTORS: "Do you have any history of heart problems or risk factors for heart disease?" (e.g., prior heart attack, angina; high blood pressure, diabetes, being overweight, high cholesterol, smoking, or strong family history of heart disease)     HTN,   10. OTHER SYMPTOMS: "Do you have any other symptoms?" (e.g., dizziness, nausea, vomiting, sweating, fever, difficulty breathing, cough)       Vomiting, sweating, shortness of breath  Protocols used: CHEST PAIN-A-AH

## 2017-09-07 ENCOUNTER — Telehealth: Payer: Self-pay

## 2017-09-07 NOTE — Telephone Encounter (Signed)
Is this patient able to work? From the paperwork her condition affects her driving, seeing and any loud noises.

## 2017-09-07 NOTE — Telephone Encounter (Signed)
Please print FMLA and we can look at it together tomorrow.

## 2017-09-07 NOTE — Telephone Encounter (Signed)
Copied from [place]. Topic: General - Other >> Sep 07, 2017  3:27 PM Patrice Paradise wrote: Reason for CRM: Patient requesting a call back from Dr. Ancil Boozer assistant to give her a call. It's in reference to her [employer] ppwk, she says the ppwk needs to be revise because it's showing she should not be working at all.

## 2017-09-11 NOTE — Telephone Encounter (Signed)
Corrections have been made and refaxed.

## 2017-09-20 ENCOUNTER — Encounter: Payer: Self-pay | Admitting: Family Medicine

## 2017-09-20 ENCOUNTER — Ambulatory Visit: Payer: 59 | Admitting: Family Medicine

## 2017-09-20 VITALS — BP 180/110 | HR 68 | Resp 16 | Ht 67.0 in | Wt 294.1 lb

## 2017-09-20 DIAGNOSIS — E785 Hyperlipidemia, unspecified: Secondary | ICD-10-CM

## 2017-09-20 DIAGNOSIS — G43111 Migraine with aura, intractable, with status migrainosus: Secondary | ICD-10-CM | POA: Diagnosis not present

## 2017-09-20 DIAGNOSIS — G44311 Acute post-traumatic headache, intractable: Secondary | ICD-10-CM | POA: Diagnosis not present

## 2017-09-20 DIAGNOSIS — I10 Essential (primary) hypertension: Secondary | ICD-10-CM | POA: Diagnosis not present

## 2017-09-20 DIAGNOSIS — E669 Obesity, unspecified: Secondary | ICD-10-CM | POA: Diagnosis not present

## 2017-09-20 DIAGNOSIS — E1169 Type 2 diabetes mellitus with other specified complication: Secondary | ICD-10-CM | POA: Diagnosis not present

## 2017-09-20 DIAGNOSIS — E063 Autoimmune thyroiditis: Secondary | ICD-10-CM | POA: Diagnosis not present

## 2017-09-20 LAB — POCT GLYCOSYLATED HEMOGLOBIN (HGB A1C): HEMOGLOBIN A1C: 6.2

## 2017-09-20 MED ORDER — OLMESARTAN-AMLODIPINE-HCTZ 40-5-25 MG PO TABS: 1.0000 | ORAL_TABLET | Freq: Every day | ORAL | 0 refills | Status: DC

## 2017-09-20 MED ORDER — NADOLOL 40 MG PO TABS: 40.0000 mg | ORAL_TABLET | Freq: Two times a day (BID) | ORAL | 5 refills | Status: DC

## 2017-09-20 MED ORDER — HYDRALAZINE HCL 50 MG PO TABS: 50.0000 mg | ORAL_TABLET | Freq: Two times a day (BID) | ORAL | 5 refills | Status: DC

## 2017-09-20 MED ORDER — CLONIDINE HCL 0.2 MG PO TABS: 0.2000 mg | ORAL_TABLET | Freq: Every day | ORAL | 5 refills | Status: DC

## 2017-09-20 MED ORDER — ONDANSETRON 4 MG PO TBDP: 4.0000 mg | ORAL_TABLET | Freq: Three times a day (TID) | ORAL | 0 refills | Status: DC | PRN

## 2017-09-20 NOTE — Progress Notes (Signed)
Name: Tiffany Nunez   MRN: [id number]    DOB: 04/16/1971   Date:09/20/2017       Progress Note  Subjective  Chief Complaint  Chief Complaint  Patient presents with  . Diabetes  . Hypertension  . Hypothyroidism    HPI   DMII :hgbA1C 6.2%  .We stopped Metformin in 2017 because of diarrhea. She was  on Trulicity since Fall 2876 andwas tolerating well, but got tired of giving herself injections weekly and also stopped because of cost and stopped taking medications.  She denies polyphagia, polydipsia or polyuria.She has gained weight since last visit and is not compliant with her diet. She will try to eat healthier again.   Migraine headaches: Migraine is described as frontal pain, throbbing like, associated with photophobia and nausea, sometimes vomiting. She described the aura as scotomas. She does not have a car at this time, and is struggling going to neurologist. She will re-schedule it. Took Imovig but still has episodes of migraine frequently - almost daily .   Hypothyroidism:she stopped seeing [name] and has been out of Synthroid for months. Last TSH was elevated, we will recheck level today.   HTN:bp is not controlled, she denies chest pain or palpitation. She skipped medication this morning and is stressed out, her daughter is struggling with a Pharmacist, hospital. Explained importance of adjusting her medication again, discussed possible side effects of Norvasc. She denies focal findings. Explained importance of compliance.   Morbid obesity: she has not been eating well and is not exercising, trying to change her life style   Patient Active Problem List   Diagnosis Date Noted  . History of cerebrovascular accident (CVA) involving cerebellum 06/12/2017  . Acquired autoimmune hypothyroidism 11/09/2016  . Elevated TSH 07/20/2016  . Vitamin D deficiency 05/03/2015  . Dyslipidemia 05/03/2015  . Gastro-esophageal reflux disease without esophagitis 05/03/2015  . Alopecia 05/03/2015  .  Genital herpes 05/03/2015  . Dysmetabolic syndrome 81/15/7262  . Positive H. pylori test 05/03/2015  . Neuralgia neuritis, sciatic nerve 05/03/2015  . Migraine without aura and responsive to treatment 04/30/2014  . HTN (hypertension) 01/22/2014  . Morbid obesity ([place]) 01/22/2014  . Diabetes mellitus type 2 in obese ([place]) 01/22/2014  . Decreased cardiac ejection fraction 01/22/2014    Past Surgical History:  Procedure Laterality Date  . ABDOMINAL HYSTERECTOMY    . ANKLE SURGERY     right  . TUBAL LIGATION      Family History  Problem Relation Age of Onset  . Hypertension Father   . Hyperlipidemia Father   . Hyperlipidemia Mother   . Hypertension Mother   . Hypertension Paternal Aunt   . Cancer Maternal Grandmother        breast    Social History   Socioeconomic History  . Marital status: Divorced    Spouse name: Not on file  . Number of children: Not on file  . Years of education: Not on file  . Highest education level: Not on file  Social Needs  . Financial resource strain: Not on file  . Food insecurity - worry: Not on file  . Food insecurity - inability: Not on file  . Transportation needs - medical: Not on file  . Transportation needs - non-medical: Not on file  Occupational History  . Not on file  Tobacco Use  . Smoking status: Never Smoker  . Smokeless tobacco: Never Used  Substance and Sexual Activity  . Alcohol use: No    Alcohol/week: 0.0 oz  .  Drug use: No  . Sexual activity: No  Other Topics Concern  . Not on file  Social History Narrative  . Not on file     Current Outpatient Medications:  .  aspirin EC 81 MG tablet, Take 1 tablet (81 mg total) by mouth daily., Disp: 100 tablet, Rfl: 2 .  atorvastatin (LIPITOR) 40 MG tablet, Take 1 tablet (40 mg total) by mouth daily., Disp: 90 tablet, Rfl: 3 .  Butalbital-APAP-Caffeine 50-300-40 MG CAPS, Take 1 capsule by mouth every 6 (six) hours as needed., Disp: 60 capsule, Rfl: 0 .  Cholecalciferol  (VITAMIN D) 2000 units CAPS, Take 1 capsule (2,000 Units total) by mouth daily., Disp: 100 capsule, Rfl: 2 .  cloNIDine (CATAPRES) 0.2 MG tablet, Take 1 tablet (0.2 mg total) by mouth at bedtime., Disp: 30 tablet, Rfl: 5 .  Erenumab-aooe (AIMOVIG) 70 MG/ML SOAJ, Inject 70 mg into the skin every 30 (thirty) days., Disp: 1 mL, Rfl: 2 .  hydrALAZINE (APRESOLINE) 50 MG tablet, Take 1 tablet (50 mg total) by mouth 2 (two) times daily., Disp: 60 tablet, Rfl: 5 .  ibuprofen (ADVIL,MOTRIN) 800 MG tablet, Take 1 tablet (800 mg total) by mouth 3 (three) times daily as needed for headache., Disp: 90 tablet, Rfl: 0 .  levothyroxine (SYNTHROID) 50 MCG tablet, Take 0.5 tablets (25 mcg total) by mouth 3 (three) times a week., Disp: 10 tablet, Rfl: 0 .  nadolol (CORGARD) 40 MG tablet, Take 1 tablet (40 mg total) by mouth 2 (two) times daily., Disp: 60 tablet, Rfl: 5 .  Olmesartan-Amlodipine-HCTZ 40-5-25 MG TABS, Take 1 tablet by mouth daily., Disp: 30 tablet, Rfl: 0 .  ondansetron (ZOFRAN-ODT) 4 MG disintegrating tablet, Take 1 tablet (4 mg total) by mouth every 8 (eight) hours as needed for nausea or vomiting., Disp: 20 tablet, Rfl: 0  Allergies  Allergen Reactions  . Imitrex [Sumatriptan] Anaphylaxis  . Topamax [Topiramate] Other (See Comments)    Caused hand numbness      ROS  Constitutional: Negative for fever she has gained  weight since last visit  Respiratory: Negative for cough and shortness of breath.   Cardiovascular: Negative for chest pain or palpitations.  Gastrointestinal: Negative for abdominal pain, no bowel changes.  Musculoskeletal: Negative for gait problem or joint swelling.  Skin: Negative for rash.  Neurological: Negative for dizziness , positive for  headaches.  No other specific complaints in a complete review of systems (except as listed in HPI above).  Objective  Vitals:   09/20/17 0808  BP: (!) 180/110  Pulse: 68  Resp: 16  SpO2: 98%  Weight: 294 lb 1.6 oz (133.4 kg)   Height: 5\' 7"  (1.702 m)    Body mass index is 46.06 kg/m.  Physical Exam  Constitutional: Patient appears well-developed and well-nourished. Obese No distress.  HEENT: head atraumatic, normocephalic, pupils equal and reactive to light,  neck supple, throat within normal limits Cardiovascular: Normal rate, regular rhythm and normal heart sounds.  No murmur heard. No BLE edema. Pulmonary/Chest: Effort normal and breath sounds normal. No respiratory distress. Abdominal: Soft.  There is no tenderness. Psychiatric: Patient has a normal mood and affect. behavior is normal. Judgment and thought content normal. Neurological: no focal findings.   Recent Results (from the past 2160 hour(s))  TSH     Status: Abnormal   Collection Time: 08/14/17  2:42 PM  Result Value Ref Range   TSH 4.76 (H) mIU/L    Comment:  Reference Range .           > or = 20 Years  0.40-4.50 .                Pregnancy Ranges           First trimester    0.26-2.66           Second trimester   0.55-2.73           Third trimester    0.43-2.91   POCT HgB A1C     Status: Abnormal   Collection Time: 09/20/17  8:21 AM  Result Value Ref Range   Hemoglobin A1C 6.2      PHQ2/9: Depression screen [facility] 2/9 11/09/2016 07/21/2016 07/19/2016 07/08/2016 04/12/2016  Decreased Interest 0 0 0 0 0  Down, Depressed, Hopeless 0 0 0 0 0  PHQ - 2 Score 0 0 0 0 0     Fall Risk: Fall Risk  09/20/2017 08/14/2017 11/09/2016 07/21/2016 07/19/2016  Falls in the past year? No No No No No    Functional Status Survey: Is the patient deaf or have difficulty hearing?: No Does the patient have difficulty seeing, even when wearing glasses/contacts?: No Does the patient have difficulty concentrating, remembering, or making decisions?: No Does the patient have difficulty walking or climbing stairs?: No Does the patient have difficulty dressing or bathing?: No Does the patient have difficulty doing errands alone such as visiting a  doctor's office or shopping?: No   Assessment & Plan  1. Diabetes mellitus type 2 in obese ([facility])  - POCT HgB A1C - Olmesartan-Amlodipine-HCTZ 40-5-25 MG TABS; Take 1 tablet by mouth daily.  Dispense: 30 tablet; Refill: 0  2. Essential hypertension  bp out of control, she is worried about her daughter and did not take medications this am.  - nadolol (CORGARD) 40 MG tablet; Take 1 tablet (40 mg total) by mouth 2 (two) times daily.  Dispense: 60 tablet; Refill: 5 - cloNIDine (CATAPRES) 0.2 MG tablet; Take 1 tablet (0.2 mg total) by mouth at bedtime.  Dispense: 30 tablet; Refill: 5 - hydrALAZINE (APRESOLINE) 50 MG tablet; Take 1 tablet (50 mg total) by mouth 2 (two) times daily.  Dispense: 60 tablet; Refill: 5  3. Migraine with aura, intractable, with status migrainosus  - ondansetron (ZOFRAN-ODT) 4 MG disintegrating tablet; Take 1 tablet (4 mg total) by mouth every 8 (eight) hours as needed for nausea or vomiting.  Dispense: 20 tablet; Refill: 0  4. Morbid obesity, unspecified obesity type ([place])  Discussed life style modification   5. Acquired autoimmune hypothyroidism  - TSH  7. Dyslipidemia  Continue statin therapy

## 2017-09-21 ENCOUNTER — Telehealth: Payer: Self-pay

## 2017-09-21 LAB — TSH: TSH: 4.31 mIU/L

## 2017-09-21 NOTE — Telephone Encounter (Signed)
-----   Message from Steele Sizer, MD sent at 09/21/2017 12:41 PM EST ----- Thyroid back to normal

## 2017-09-21 NOTE — Telephone Encounter (Signed)
Called to give patient her lab results and voicemail box was not set up.

## 2017-09-27 ENCOUNTER — Ambulatory Visit: Payer: 59

## 2017-10-02 ENCOUNTER — Ambulatory Visit (INDEPENDENT_AMBULATORY_CARE_PROVIDER_SITE_OTHER): Payer: 59

## 2017-10-02 DIAGNOSIS — I10 Essential (primary) hypertension: Secondary | ICD-10-CM

## 2017-10-02 NOTE — Progress Notes (Signed)
Patient is here for a blood pressure check. Patient denies chest pain, palpitations, shortness of breath or visual disturbances. At previous visit blood pressure was 180/110 with a heart rate of 68. Today during nurse visit first check blood pressure was 120/80 and heart rate was 66. He/She does Olmesartan-Amlodipine-HCTZ 40-5-25 mg once daily.

## 2017-10-03 ENCOUNTER — Other Ambulatory Visit: Payer: Self-pay | Admitting: Family Medicine

## 2017-10-03 NOTE — Telephone Encounter (Signed)
Refill request for thyroid medication. Levothyroxine to [facility].   Last Visit 09/20/2017   Lab Results  Component Value Date   TSH 4.31 09/20/2017     Follow up on 10/24/2017

## 2017-10-12 ENCOUNTER — Other Ambulatory Visit: Payer: Self-pay

## 2017-10-12 DIAGNOSIS — E1169 Type 2 diabetes mellitus with other specified complication: Secondary | ICD-10-CM

## 2017-10-12 DIAGNOSIS — E669 Obesity, unspecified: Principal | ICD-10-CM

## 2017-10-12 MED ORDER — OLMESARTAN-AMLODIPINE-HCTZ 40-5-25 MG PO TABS: 1.0000 | ORAL_TABLET | Freq: Every day | ORAL | 0 refills | Status: DC

## 2017-10-12 NOTE — Telephone Encounter (Signed)
Hypertension medication request: 09/20/2017   Olm-Aml-HCTZ. Last office visit pertaining to hypertension:  BP Readings from Last 3 Encounters:  09/20/17 (!) 180/110  08/14/17 (!) 150/100  06/12/17 138/90    Lab Results  Component Value Date   CREATININE 0.88 06/12/2017   BUN 14 06/12/2017   NA 139 06/12/2017   K 3.6 06/12/2017   CL 102 06/12/2017   CO2 29 06/12/2017    Follow up on 10/24/2017-Patient will be 3 days short of her medication.

## 2017-10-17 ENCOUNTER — Other Ambulatory Visit: Payer: Self-pay | Admitting: Family Medicine

## 2017-10-17 DIAGNOSIS — E669 Obesity, unspecified: Principal | ICD-10-CM

## 2017-10-17 DIAGNOSIS — E1169 Type 2 diabetes mellitus with other specified complication: Secondary | ICD-10-CM

## 2017-10-24 ENCOUNTER — Ambulatory Visit: Payer: 59 | Admitting: Family Medicine

## 2017-12-20 ENCOUNTER — Ambulatory Visit: Payer: Self-pay | Admitting: *Deleted

## 2017-12-20 ENCOUNTER — Telehealth: Payer: Self-pay | Admitting: Family Medicine

## 2017-12-20 NOTE — Telephone Encounter (Signed)
Attempted to call patient several times to talk about her symptoms. She stated that she had some bleeding from hemorrhoids.  No answer, verbal message left for her to call us back.

## 2017-12-20 NOTE — Telephone Encounter (Signed)
Patient phoned with hemorrhoids that she noticed bleeding last night and again this morning. She stated her stools have been loose over the last 2-3 days but denies straining and constipation. She has not tried any treatment to the hemorrhoids. Advice care given to do warm epsom  soaks and Prep H OTC. She denies any dehydration symptoms at this time.She is not able to come in for an appointment due to her new job. She will call back if needed.   Reason for Disposition . MILD-MODERATE diarrhea (e.g., 1-6 times / day more than normal) . Rectal bleeding is minimal (e.g., blood just on toilet paper, a few drops in toilet bowl)  Answer Assessment - Initial Assessment Questions 1. DIARRHEA SEVERITY: "How bad is the diarrhea?" "How many extra stools have you had in the past 24 hours than normal?"    - MILD: Few loose or mushy BMs; increase of 1-3 stools over normal daily number of stools; mild increase in ostomy output.   - MODERATE: Increase of 4-6 stools daily over normal; moderate increase in ostomy output.   - SEVERE (or Worst Possible): Increase of 7 or more stools daily over normal; moderate increase in ostomy output; incontinence.     Moderate-4 watery stools in the last 24 hours. 2. ONSET: "When did the diarrhea begin?"     3 days 3. BM CONSISTENCY: "How loose or watery is the diarrhea?"   mostly watery with few formed pieces. 4. VOMITING: "Are you also vomiting?" If so, ask: "How many times in the past 24 hours?"    no 5. ABDOMINAL PAIN: "Are you having any abdominal pain?" If yes: "What does it feel like?" (e.g., crampy, dull, intermittent, constant)     Dull aches with the stools only. 6. ABDOMINAL PAIN SEVERITY: If present, ask: "How bad is the pain?"  (e.g., Scale 1-10; mild, moderate, or severe)    - MILD (1-3): doesn't interfere with normal activities, abdomen soft and not tender to touch     - MODERATE (4-7): interferes with normal activities or awakens from sleep, tender to touch   - SEVERE (8-10): excruciating pain, doubled over, unable to do any normal activities      mild 7. ORAL INTAKE: If vomiting, "Have you been able to drink liquids?" "How much fluids have you had in the past 24 hours?"    yes 8. HYDRATION: "Any signs of dehydration?" (e.g., dry mouth [not just dry lips], too weak to stand, dizziness, new weight loss) "When did you last urinate?" no 9. EXPOSURE: "Have you traveled to a foreign country recently?" "Have you been exposed to anyone with diarrhea?" "Could you have eaten any food that was spoiled?"   no 10. OTHER SYMPTOMS: "Do you have any other symptoms?" (e.g., fever, blood in stool)    Bleeding hemorrhoids. 11. PREGNANCY: "Is there any chance you are pregnant?" "When was your last menstrual period?"     no  Protocols used: DIARRHEA-A-AH, RECTAL BLEEDING-A-AH

## 2017-12-21 NOTE — Telephone Encounter (Signed)
Called to check on patient and unable to reach her.

## 2018-03-27 ENCOUNTER — Encounter: Payer: Self-pay | Admitting: Emergency Medicine

## 2018-03-27 ENCOUNTER — Encounter: Payer: Self-pay | Admitting: Family Medicine

## 2018-03-27 ENCOUNTER — Ambulatory Visit (INDEPENDENT_AMBULATORY_CARE_PROVIDER_SITE_OTHER): Payer: BLUE CROSS/BLUE SHIELD | Admitting: Family Medicine

## 2018-03-27 VITALS — BP 160/100 | HR 74 | Temp 98.2°F | Resp 16 | Ht 67.0 in | Wt 294.8 lb

## 2018-03-27 DIAGNOSIS — G43119 Migraine with aura, intractable, without status migrainosus: Secondary | ICD-10-CM | POA: Diagnosis not present

## 2018-03-27 DIAGNOSIS — I10 Essential (primary) hypertension: Secondary | ICD-10-CM

## 2018-03-27 MED ORDER — ONDANSETRON 4 MG PO TBDP: 4.0000 mg | ORAL_TABLET | Freq: Three times a day (TID) | ORAL | 0 refills | Status: DC | PRN

## 2018-03-27 MED ORDER — BUTALBITAL-APAP-CAFFEINE 50-300-40 MG PO CAPS: 1.0000 | ORAL_CAPSULE | Freq: Four times a day (QID) | ORAL | 0 refills | Status: DC | PRN

## 2018-03-27 MED ORDER — PROMETHAZINE HCL 25 MG/ML IJ SOLN
25.0000 mg | Freq: Once | INTRAMUSCULAR | Status: AC
  Administered 2018-03-27: 25 mg via INTRAMUSCULAR

## 2018-03-27 MED ORDER — KETOROLAC TROMETHAMINE 60 MG/2ML IM SOLN
60.0000 mg | Freq: Once | INTRAMUSCULAR | Status: AC
  Administered 2018-03-27: 60 mg via INTRAMUSCULAR

## 2018-03-27 NOTE — Patient Instructions (Signed)
Recurrent Migraine Headache A migraine headache is very bad, throbbing pain that is usually on one side of your head. Recurrent migraines keep coming back (recurring). Talk with your doctor about what things may bring on (trigger) your migraine headaches. Follow these instructions at home: Medicines  Take over-the-counter and prescription medicines only as told by your doctor.  Do not drive or use heavy machinery while taking prescription pain medicine. Lifestyle  Do not use any products that contain nicotine or tobacco, such as cigarettes and e-cigarettes. If you need help quitting, ask your doctor.  Limit alcohol intake to no more than 1 drink a day for nonpregnant women and 2 drinks a day for men. One drink equals 12 oz of beer, 5 oz of wine, or 1 oz of hard liquor.  Get 7-9 hours of sleep each night.  Lessen any stress in your life. Ask your doctor about ways to lower your stress.  Stay at a healthy weight. Talk with your doctor if you need help losing weight.  Get regular exercise. General instructions  Keep a journal to find out if certain things bring on migraine headaches. For example, write down: ? What you eat and drink. ? How much sleep you get. ? Any change to your diet or medicines.  Lie down in a dark, quiet room when you have a migraine.  Try placing a cool towel over your head when you have a migraine.  Keep lights dim if bright lights bother you or make your migraines worse.  Keep all follow-up visits as told by your doctor. This is important. Contact a doctor if:  Medicine does not help your migraines.  Your pain keeps coming back.  You have a fever.  You have weight loss without trying. Get help right away if:  Your migraine becomes really bad and medicine does not help.  You have a stiff neck.  You have trouble seeing.  Your muscles are weak or you lose control of your muscles.  You lose your balance or have trouble walking.  You feel like  you will pass out (faint) or you pass out.  You have really bad symptoms that are different than your first symptoms.  You start having sudden, very bad headaches that last for one second or less, like a thunderclap. Summary  A migraine headache is very bad, throbbing pain that is usually on one side of your head.  Talk with your doctor about what things may bring on (trigger) your migraine headaches.  Take over-the-counter and prescription medicines only as told by your doctor.  Lie down in a dark, quiet room when you have a migraine.  Keep a journal about what you eat and drink, how much sleep you get, and any changes to your medicines. This can help you find out if certain things make you have migraine headaches. This information is not intended to replace advice given to you by your health care provider. Make sure you discuss any questions you have with your health care provider. Document Released: 06/14/2008 Document Revised: 07/29/2016 Document Reviewed: 07/29/2016 Elsevier Interactive Patient Education  2017 [place] DASH stands for "Dietary Approaches to Stop Hypertension." The DASH eating plan is a healthy eating plan that has been shown to reduce high blood pressure (hypertension). It may also reduce your risk for type 2 diabetes, heart disease, and stroke. The DASH eating plan may also help with weight loss. What are tips for following this plan? General guidelines  Avoid eating more than 2,300 mg (milligrams) of salt (sodium) a day. If you have hypertension, you may need to reduce your sodium intake to 1,500 mg a day.  Limit alcohol intake to no more than 1 drink a day for nonpregnant women and 2 drinks a day for men. One drink equals 12 oz of beer, 5 oz of wine, or 1 oz of hard liquor.  Work with your health care provider to maintain a healthy body weight or to lose weight. Ask what an ideal weight is for you.  Get at least 30 minutes of exercise  that causes your heart to beat faster (aerobic exercise) most days of the week. Activities may include walking, swimming, or biking.  Work with your health care provider or diet and nutrition specialist (dietitian) to adjust your eating plan to your individual calorie needs. Reading food labels  Check food labels for the amount of sodium per serving. Choose foods with less than 5 percent of the Daily Value of sodium. Generally, foods with less than 300 mg of sodium per serving fit into this eating plan.  To find whole grains, look for the word "whole" as the first word in the ingredient list. Shopping  Buy products labeled as "low-sodium" or "no salt added."  Buy fresh foods. Avoid canned foods and premade or frozen meals. Cooking  Avoid adding salt when cooking. Use salt-free seasonings or herbs instead of table salt or sea salt. Check with your health care provider or pharmacist before using salt substitutes.  Do not fry foods. Cook foods using healthy methods such as baking, boiling, grilling, and broiling instead.  Cook with heart-healthy oils, such as olive, canola, soybean, or sunflower oil. Meal planning   Eat a balanced diet that includes: ? 5 or more servings of fruits and vegetables each day. At each meal, try to fill half of your plate with fruits and vegetables. ? Up to 6-8 servings of whole grains each day. ? Less than 6 oz of lean meat, poultry, or fish each day. A 3-oz serving of meat is about the same size as a deck of cards. One egg equals 1 oz. ? 2 servings of low-fat dairy each day. ? A serving of nuts, seeds, or beans 5 times each week. ? Heart-healthy fats. Healthy fats called Omega-3 fatty acids are found in foods such as flaxseeds and coldwater fish, like sardines, salmon, and mackerel.  Limit how much you eat of the following: ? Canned or prepackaged foods. ? Food that is high in trans fat, such as fried foods. ? Food that is high in saturated fat, such as  fatty meat. ? Sweets, desserts, sugary drinks, and other foods with added sugar. ? Full-fat dairy products.  Do not salt foods before eating.  Try to eat at least 2 vegetarian meals each week.  Eat more home-cooked food and less restaurant, buffet, and fast food.  When eating at a restaurant, ask that your food be prepared with less salt or no salt, if possible. What foods are recommended? The items listed may not be a complete list. Talk with your dietitian about what dietary choices are best for you. Grains Whole-grain or whole-wheat bread. Whole-grain or whole-wheat pasta. Brown rice. [place]. Bulgur. Whole-grain and low-sodium cereals. Pita bread. Low-fat, low-sodium crackers. Whole-wheat flour tortillas. Vegetables Fresh or frozen vegetables (raw, steamed, roasted, or grilled). Low-sodium or reduced-sodium tomato and vegetable juice. Low-sodium or reduced-sodium tomato sauce and tomato paste. Low-sodium or reduced-sodium canned vegetables. Fruits  All fresh, dried, or frozen fruit. Canned fruit in natural juice (without added sugar). Meat and other protein foods Skinless chicken or [place]. Ground chicken or [place]. Pork with fat trimmed off. Fish and seafood. Egg whites. Dried beans, peas, or lentils. Unsalted nuts, nut butters, and seeds. Unsalted canned beans. Lean cuts of beef with fat trimmed off. Low-sodium, lean deli meat. Dairy Low-fat (1%) or fat-free (skim) milk. Fat-free, low-fat, or reduced-fat cheeses. Nonfat, low-sodium ricotta or cottage cheese. Low-fat or nonfat yogurt. Low-fat, low-sodium cheese. Fats and oils Soft margarine without trans fats. Vegetable oil. Low-fat, reduced-fat, or light mayonnaise and salad dressings (reduced-sodium). Canola, safflower, olive, soybean, and sunflower oils. Avocado. Seasoning and other foods Herbs. Spices. Seasoning mixes without salt. Unsalted popcorn and pretzels. Fat-free sweets. What foods are not recommended? The items  listed may not be a complete list. Talk with your dietitian about what dietary choices are best for you. Grains Baked goods made with fat, such as croissants, muffins, or some breads. Dry pasta or rice meal packs. Vegetables Creamed or fried vegetables. Vegetables in a cheese sauce. Regular canned vegetables (not low-sodium or reduced-sodium). Regular canned tomato sauce and paste (not low-sodium or reduced-sodium). Regular tomato and vegetable juice (not low-sodium or reduced-sodium). Angie Fava. Olives. Fruits Canned fruit in a light or heavy syrup. Fried fruit. Fruit in cream or butter sauce. Meat and other protein foods Fatty cuts of meat. Ribs. Fried meat. Berniece Salines. Sausage. Bologna and other processed lunch meats. Salami. Fatback. Hotdogs. Bratwurst. Salted nuts and seeds. Canned beans with added salt. Canned or smoked fish. Whole eggs or egg yolks. Chicken or [place] with skin. Dairy Whole or 2% milk, cream, and half-and-half. Whole or full-fat cream cheese. Whole-fat or sweetened yogurt. Full-fat cheese. Nondairy creamers. Whipped toppings. Processed cheese and cheese spreads. Fats and oils Butter. Stick margarine. Lard. Shortening. Ghee. Bacon fat. Tropical oils, such as coconut, palm kernel, or palm oil. Seasoning and other foods Salted popcorn and pretzels. Onion salt, garlic salt, seasoned salt, table salt, and sea salt. Worcestershire sauce. Tartar sauce. Barbecue sauce. Teriyaki sauce. Soy sauce, including reduced-sodium. Steak sauce. Canned and packaged gravies. Fish sauce. Oyster sauce. Cocktail sauce. Horseradish that you find on the shelf. Ketchup. Mustard. Meat flavorings and tenderizers. Bouillon cubes. Hot sauce and Tabasco sauce. Premade or packaged marinades. Premade or packaged taco seasonings. Relishes. Regular salad dressings. Where to find more information:  National Heart, Lung, and [place]: https://wilson-eaton.com/  American Heart Association: www.heart.org Summary  The  DASH eating plan is a healthy eating plan that has been shown to reduce high blood pressure (hypertension). It may also reduce your risk for type 2 diabetes, heart disease, and stroke.  With the DASH eating plan, you should limit salt (sodium) intake to 2,300 mg a day. If you have hypertension, you may need to reduce your sodium intake to 1,500 mg a day.  When on the DASH eating plan, aim to eat more fresh fruits and vegetables, whole grains, lean proteins, low-fat dairy, and heart-healthy fats.  Work with your health care provider or diet and nutrition specialist (dietitian) to adjust your eating plan to your individual calorie needs. This information is not intended to replace advice given to you by your health care provider. Make sure you discuss any questions you have with your health care provider. Document Released: 08/25/2011 Document Revised: 08/29/2016 Document Reviewed: 08/29/2016 Elsevier Interactive Patient Education  [employer].

## 2018-03-27 NOTE — Progress Notes (Signed)
Name: Tiffany Nunez   MRN: [id number]    DOB: 1971-07-15   Date:03/27/2018       Progress Note  Subjective  Chief Complaint  Chief Complaint  Patient presents with  . Headache    with nausea and vominting for 2 days    HPI  PT presents with concern for migraine. She has history of migraines in the past - is out of her fioricet.  Has been taking ibuprofen and zofran without relief of symptoms.  Her BP is also uncontrolled today - states took her meds last night, couldn't take this morning due to nausea. Positive for photosensitivity, nausea, and vomiting; negative for phonosensitivity, vision changes, numbness/tinlging, weakness.  Last dose of ibuprofen was last night.  BP Meds: Olmesartan-Amlodipine-HCTZ, nadolol, hydralazine, and clonidine Last dose of aimovig was in January 2019 - it was too expensive.  Patient Active Problem List   Diagnosis Date Noted  . History of cerebrovascular accident (CVA) involving cerebellum 06/12/2017  . Acquired autoimmune hypothyroidism 11/09/2016  . Elevated TSH 07/20/2016  . Vitamin D deficiency 05/03/2015  . Dyslipidemia 05/03/2015  . Gastro-esophageal reflux disease without esophagitis 05/03/2015  . Alopecia 05/03/2015  . Genital herpes 05/03/2015  . Dysmetabolic syndrome 07/37/1062  . Positive H. pylori test 05/03/2015  . Neuralgia neuritis, sciatic nerve 05/03/2015  . Migraine without aura and responsive to treatment 04/30/2014  . HTN (hypertension) 01/22/2014  . Morbid obesity ([place]) 01/22/2014  . Diabetes mellitus type 2 in obese ([place]) 01/22/2014  . Decreased cardiac ejection fraction 01/22/2014    Social History   Tobacco Use  . Smoking status: Never Smoker  . Smokeless tobacco: Never Used  Substance Use Topics  . Alcohol use: No    Alcohol/week: 0.0 oz     Current Outpatient Medications:  .  aspirin EC 81 MG tablet, Take 1 tablet (81 mg total) by mouth daily., Disp: 100 tablet, Rfl: 2 .  atorvastatin (LIPITOR) 40 MG tablet,  Take 1 tablet (40 mg total) by mouth daily., Disp: 90 tablet, Rfl: 3 .  Butalbital-APAP-Caffeine 50-300-40 MG CAPS, Take 1 capsule by mouth every 6 (six) hours as needed., Disp: 60 capsule, Rfl: 0 .  Cholecalciferol (VITAMIN D) 2000 units CAPS, Take 1 capsule (2,000 Units total) by mouth daily., Disp: 100 capsule, Rfl: 2 .  cloNIDine (CATAPRES) 0.2 MG tablet, Take 1 tablet (0.2 mg total) by mouth at bedtime., Disp: 30 tablet, Rfl: 5 .  Erenumab-aooe (AIMOVIG) 70 MG/ML SOAJ, Inject 70 mg into the skin every 30 (thirty) days., Disp: 1 mL, Rfl: 2 .  hydrALAZINE (APRESOLINE) 50 MG tablet, Take 1 tablet (50 mg total) by mouth 2 (two) times daily., Disp: 60 tablet, Rfl: 5 .  ibuprofen (ADVIL,MOTRIN) 800 MG tablet, Take 1 tablet (800 mg total) by mouth 3 (three) times daily as needed for headache., Disp: 90 tablet, Rfl: 0 .  levothyroxine (SYNTHROID, LEVOTHROID) 50 MCG tablet, TAKE 1/2 TABLET BY MOUTH 3 TIMES WEEKLY, Disp: 30 tablet, Rfl: 1 .  nadolol (CORGARD) 40 MG tablet, Take 1 tablet (40 mg total) by mouth 2 (two) times daily., Disp: 60 tablet, Rfl: 5 .  Olmesartan-Amlodipine-HCTZ 40-5-25 MG TABS, TAKE 1 TABLET BY MOUTH EVERY DAY, Disp: 30 tablet, Rfl: 0 .  ondansetron (ZOFRAN-ODT) 4 MG disintegrating tablet, Take 1 tablet (4 mg total) by mouth every 8 (eight) hours as needed for nausea or vomiting., Disp: 20 tablet, Rfl: 0  Allergies  Allergen Reactions  . Imitrex [Sumatriptan] Anaphylaxis  . Topamax [Topiramate] Other (See  Comments)    Caused hand numbness     ROS  Constitutional: Negative for fever or weight change.  Respiratory: Negative for cough and shortness of breath.   Cardiovascular: Negative for chest pain or palpitations.  Gastrointestinal: Negative for abdominal pain, no bowel changes.  Musculoskeletal: Negative for gait problem or joint swelling.  Skin: Negative for rash.  Neurological: See HPI No other specific complaints in a complete review of systems (except as listed in  HPI above).  Objective  Vitals:   03/27/18 1130  BP: (!) 160/100  Pulse: 74  Resp: 16  Temp: 98.2 F (36.8 C)  TempSrc: Oral  SpO2: 95%  Weight: 294 lb 12.8 oz (133.7 kg)  Height: 5\' 7"  (1.702 m)   Body mass index is 46.17 kg/m.  Nursing Note and Vital Signs reviewed.  Physical Exam  Constitutional: Patient appears well-developed and well-nourished. Obese.  No distress.  HEENT: head atraumatic, normocephalic, pupils equal and reactive to light, EOM's intact, TM's without erythema or bulging, no maxillary or frontal sinus tenderness , neck supple without lymphadenopathy, oropharynx pink and moist without exudate Cardiovascular: Normal rate, regular rhythm, S1/S2 present.  No murmur or rub heard. No BLE edema. Pulmonary/Chest: Effort normal and breath sounds clear. No respiratory distress or retractions. Abdominal: Soft and non-tender, bowel sounds present x4 quadrants. Psychiatric: Patient has a normal mood and affect. behavior is normal. Judgment and thought content normal.  No results found for this or any previous visit (from the past 72 hour(s)).  Assessment & Plan  1. Migraine with aura, intractable, without status migrainosus - ondansetron (ZOFRAN-ODT) 4 MG disintegrating tablet; Take 1 tablet (4 mg total) by mouth every 8 (eight) hours as needed for nausea or vomiting.  Dispense: 20 tablet; Refill: 0 - Butalbital-APAP-Caffeine 50-300-40 MG CAPS; Take 1 capsule by mouth every 6 (six) hours as needed.  Dispense: 60 capsule; Refill: 0 - promethazine (PHENERGAN) injection 25 mg - ketorolac (TORADOL) injection 60 mg - Ambulatory referral to Neurology  2. Essential hypertension - Follow up in 4-5 days for BP check with PCP - if BP is not improving after migraine has resolved, we will need to adjust medications.  -Red flags and when to present for emergency care or RTC including fever >101.46F, chest pain, shortness of breath, new/worsening/un-resolving symptoms, facial  droop, slurred speech, limb weakness, reviewed with patient at time of visit. Follow up and care instructions discussed and provided in AVS.

## 2018-03-30 ENCOUNTER — Ambulatory Visit (INDEPENDENT_AMBULATORY_CARE_PROVIDER_SITE_OTHER): Payer: BLUE CROSS/BLUE SHIELD | Admitting: Nurse Practitioner

## 2018-03-30 ENCOUNTER — Other Ambulatory Visit: Payer: Self-pay | Admitting: Nurse Practitioner

## 2018-03-30 ENCOUNTER — Encounter: Payer: Self-pay | Admitting: Nurse Practitioner

## 2018-03-30 VITALS — BP 120/82 | HR 85 | Temp 98.6°F | Resp 16 | Ht 67.0 in | Wt 293.2 lb

## 2018-03-30 DIAGNOSIS — R05 Cough: Secondary | ICD-10-CM

## 2018-03-30 DIAGNOSIS — G43119 Migraine with aura, intractable, without status migrainosus: Secondary | ICD-10-CM | POA: Diagnosis not present

## 2018-03-30 DIAGNOSIS — R059 Cough, unspecified: Secondary | ICD-10-CM

## 2018-03-30 MED ORDER — NABUMETONE 500 MG PO TABS: 500.0000 mg | ORAL_TABLET | Freq: Every day | ORAL | 0 refills | Status: DC

## 2018-03-30 MED ORDER — ERENUMAB-AOOE 70 MG/ML ~~LOC~~ SOAJ: 70.0000 mg | SUBCUTANEOUS | 2 refills | Status: DC

## 2018-03-30 MED ORDER — PROMETHAZINE-DM 6.25-15 MG/5ML PO SYRP: 2.5000 mL | ORAL_SOLUTION | Freq: Four times a day (QID) | ORAL | 0 refills | Status: DC | PRN

## 2018-03-30 NOTE — Progress Notes (Addendum)
Name: Tiffany Nunez   MRN: [id number]    DOB: May 26, 1971   Date:03/30/2018       Progress Note  Subjective  Chief Complaint  Chief Complaint  Patient presents with  . Cough  . Fever    HPI  Was seen her eon 7/9 for migraine that was unrelieved with ibuprofen and zofran at home was given a shot of phenergan and toradol in office which didn't help at all. States still having left sided migraine pain and in addition started getting a dry cough that is strong- constant; has been unable to sleep for the last 3 days. Patient has had subjective fevers or chills. Has been taking coricidin products without relief. Has tried benzonatate from last year without any relief of coughing.  States in general has 15 migraine days a month; has tried stadol, imitrex, topamax,  And other meds she cannot recall at this time without relief of symptoms or unable to tolerate side effects.   Pt sts ears were hurting Wednesday but not anymore; endorses sore throat and sore chest from coughing states has had bad coughing fits that caused her to through up.   Patient Active Problem List   Diagnosis Date Noted  . History of cerebrovascular accident (CVA) involving cerebellum 06/12/2017  . Acquired autoimmune hypothyroidism 11/09/2016  . Elevated TSH 07/20/2016  . Vitamin D deficiency 05/03/2015  . Dyslipidemia 05/03/2015  . Gastro-esophageal reflux disease without esophagitis 05/03/2015  . Alopecia 05/03/2015  . Genital herpes 05/03/2015  . Dysmetabolic syndrome 78/24/2353  . Positive H. pylori test 05/03/2015  . Neuralgia neuritis, sciatic nerve 05/03/2015  . Migraine without aura and responsive to treatment 04/30/2014  . HTN (hypertension) 01/22/2014  . Morbid obesity ([place]) 01/22/2014  . Diabetes mellitus type 2 in obese ([place]) 01/22/2014  . Decreased cardiac ejection fraction 01/22/2014    Past Medical History:  Diagnosis Date  . Anemia   . Depressive disorder   . Diabetes mellitus, type II ([place])   .  Esophageal reflux   . Essential hypertension, benign   . Genital herpes, unspecified   . Helicobacter pylori (H. pylori)   . Metabolic syndrome   . Migraine, unspecified, without mention of intractable migraine without mention of status migrainosus   . Nonspecific abnormal electrocardiogram (ECG) (EKG)   . Nonspecific abnormal results of thyroid function study   . Obesity, unspecified   . Other and unspecified hyperlipidemia   . Unspecified vitamin D deficiency     Past Surgical History:  Procedure Laterality Date  . ABDOMINAL HYSTERECTOMY    . ANKLE SURGERY     right  . TUBAL LIGATION      Social History   Tobacco Use  . Smoking status: Never Smoker  . Smokeless tobacco: Never Used  Substance Use Topics  . Alcohol use: No    Alcohol/week: 0.0 oz     Current Outpatient Medications:  .  aspirin EC 81 MG tablet, Take 1 tablet (81 mg total) by mouth daily., Disp: 100 tablet, Rfl: 2 .  atorvastatin (LIPITOR) 40 MG tablet, Take 1 tablet (40 mg total) by mouth daily., Disp: 90 tablet, Rfl: 3 .  Butalbital-APAP-Caffeine 50-300-40 MG CAPS, Take 1 capsule by mouth every 6 (six) hours as needed., Disp: 60 capsule, Rfl: 0 .  Cholecalciferol (VITAMIN D) 2000 units CAPS, Take 1 capsule (2,000 Units total) by mouth daily., Disp: 100 capsule, Rfl: 2 .  cloNIDine (CATAPRES) 0.2 MG tablet, Take 1 tablet (0.2 mg total) by mouth at bedtime.,  Disp: 30 tablet, Rfl: 5 .  Erenumab-aooe (AIMOVIG) 70 MG/ML SOAJ, Inject 70 mg into the skin every 30 (thirty) days., Disp: 1 mL, Rfl: 2 .  hydrALAZINE (APRESOLINE) 50 MG tablet, Take 1 tablet (50 mg total) by mouth 2 (two) times daily., Disp: 60 tablet, Rfl: 5 .  ibuprofen (ADVIL,MOTRIN) 800 MG tablet, Take 1 tablet (800 mg total) by mouth 3 (three) times daily as needed for headache., Disp: 90 tablet, Rfl: 0 .  levothyroxine (SYNTHROID, LEVOTHROID) 50 MCG tablet, TAKE 1/2 TABLET BY MOUTH 3 TIMES WEEKLY, Disp: 30 tablet, Rfl: 1 .  nadolol (CORGARD) 40 MG  tablet, Take 1 tablet (40 mg total) by mouth 2 (two) times daily., Disp: 60 tablet, Rfl: 5 .  Olmesartan-Amlodipine-HCTZ 40-5-25 MG TABS, TAKE 1 TABLET BY MOUTH EVERY DAY, Disp: 30 tablet, Rfl: 0 .  ondansetron (ZOFRAN-ODT) 4 MG disintegrating tablet, Take 1 tablet (4 mg total) by mouth every 8 (eight) hours as needed for nausea or vomiting., Disp: 20 tablet, Rfl: 0  Allergies  Allergen Reactions  . Imitrex [Sumatriptan] Anaphylaxis  . Topamax [Topiramate] Other (See Comments)    Caused hand numbness     ROS    No other specific complaints in a complete review of systems (except as listed in HPI above).  Objective  Vitals:   03/30/18 1105  BP: 120/82  Pulse: 85  Resp: 16  Temp: 98.6 F (37 C)  TempSrc: Oral  SpO2: 97%  Weight: 293 lb 3.2 oz (133 kg)  Height: 5\' 7"  (1.702 m)     Body mass index is 45.92 kg/m.  Nursing Note and Vital Signs reviewed.  Physical Exam   Constitutional: Patient appears well-developed and well-nourished. Obese  No distress.  HEENT: head atraumatic, normocephalic, pupils equal and reactive to light, TM's without erythema or bulging,  no maxillary or frontal sinus tenderness , neck supple without lymphadenopathy, oropharynx pink and moist without exudate, no nasal discharge Cardiovascular: Normal rate, regular rhythm, S1/S2 present. Pulses intact Pulmonary/Chest: Effort normal and breath sounds clear. No respiratory distress or retractions. Neuro: no nystagmus, sensation intact, steady gait, equal strength. DTRs WNL Psychiatric: Patient has a normal mood and affect. behavior is normal. Judgment and thought content normal.  No results found for this or any previous visit (from the past 72 hour(s)).  Assessment & Plan  1. Cough - promethazine-dextromethorphan (PROMETHAZINE-DM) 6.25-15 MG/5ML syrup; Take 2.5 mLs by mouth 4 (four) times daily as needed for cough.  Dispense: 118 mL; Refill: 0  2. Migraine with aura, intractable, without status  migrainosus - recommend 64 ounces of water a day - Do not take any NSAIDs today like aspirin, ibuprofen, naproxen, Excedrin or goody powders- I am prescribing you a strong NSAID take 1.5 pills with a meal today and then if still having migraine 12 hours take the last 1/2 pill - If migraine is still not improving causing severe pain may need to go to urgent care or ER for IV medications to help break migraine syndrome  - Erenumab-aooe (AIMOVIG) 70 MG/ML SOAJ; Inject 70 mg into the skin every 30 (thirty) days.  Dispense: 1 mL; Refill: 2 - nabumetone (RELAFEN) 500 MG tablet; Take 1 tablet (500 mg total) by mouth daily. Take 1.5 tabs with meal and if migraine still present take 1/2 tab 12 hours later.  Dispense: 2 tablet; Refill: 0   -Red flags and when to present for emergency care or RTC including fever >101.68F, vision changes, unilateral weakness, slurred speech, new/worsening/un-resolving symptoms,  reviewed with patient at time of visit. Follow up and care instructions discussed and provided in AVS.  ---------------------------------------------- I have reviewed this encounter including the documentation in this note and/or discussed this patient with the provider, Suezanne Cheshire DNP AGNP-C. I am certifying that I agree with the content of this note as supervising physician. Enid Derry, [facility] 04/02/2018, 5:30 PM

## 2018-03-30 NOTE — Patient Instructions (Addendum)
- recommend 64 ounces of water a day - Do not take any NSAIDs today like aspirin, ibuprofen, naproxen, Excedrin or goody powders- I am prescribing you a strong NSAID take 1.5 pills with a meal today and then if still having migraine 12 hours take the last 1/2 pill - If migraine is still not improving causing severe pain may need to go to urgent care or ER for IV medications to help break migraine syndrome   Recurrent Migraine Headache A migraine headache is very bad, throbbing pain that is usually on one side of your head. Recurrent migraines keep coming back (recurring). Talk with your doctor about what things may bring on (trigger) your migraine headaches. Follow these instructions at home: Medicines  Take over-the-counter and prescription medicines only as told by your doctor.  Do not drive or use heavy machinery while taking prescription pain medicine. Lifestyle  Do not use any products that contain nicotine or tobacco, such as cigarettes and e-cigarettes. If you need help quitting, ask your doctor.  Limit alcohol intake to no more than 1 drink a day for nonpregnant women and 2 drinks a day for men. One drink equals 12 oz of beer, 5 oz of wine, or 1 oz of hard liquor.  Get 7-9 hours of sleep each night.  Lessen any stress in your life. Ask your doctor about ways to lower your stress.  Stay at a healthy weight. Talk with your doctor if you need help losing weight.  Get regular exercise. General instructions  Keep a journal to find out if certain things bring on migraine headaches. For example, write down: ? What you eat and drink. ? How much sleep you get. ? Any change to your diet or medicines.  Lie down in a dark, quiet room when you have a migraine.  Try placing a cool towel over your head when you have a migraine.  Keep lights dim if bright lights bother you or make your migraines worse.  Keep all follow-up visits as told by your doctor. This is important. Contact a  doctor if:  Medicine does not help your migraines.  Your pain keeps coming back.  You have a fever.  You have weight loss without trying. Get help right away if:  Your migraine becomes really bad and medicine does not help.  You have a stiff neck.  You have trouble seeing.  Your muscles are weak or you lose control of your muscles.  You lose your balance or have trouble walking.  You feel like you will pass out (faint) or you pass out.  You have really bad symptoms that are different than your first symptoms.  You start having sudden, very bad headaches that last for one second or less, like a thunderclap. Summary  A migraine headache is very bad, throbbing pain that is usually on one side of your head.  Talk with your doctor about what things may bring on (trigger) your migraine headaches.  Take over-the-counter and prescription medicines only as told by your doctor.  Lie down in a dark, quiet room when you have a migraine.  Keep a journal about what you eat and drink, how much sleep you get, and any changes to your medicines. This can help you find out if certain things make you have migraine headaches. This information is not intended to replace advice given to you by your health care provider. Make sure you discuss any questions you have with your health care provider. Document Released: 06/14/2008  Document Revised: 07/29/2016 Document Reviewed: 07/29/2016 Elsevier Interactive Patient Education  2017 [employer].

## 2018-04-02 ENCOUNTER — Encounter: Payer: Self-pay | Admitting: Emergency Medicine

## 2018-04-02 ENCOUNTER — Encounter: Payer: Self-pay | Admitting: Family Medicine

## 2018-04-02 ENCOUNTER — Ambulatory Visit
Admission: RE | Admit: 2018-04-02 | Discharge: 2018-04-02 | Disposition: A | Discharge status: Home / Self Care | Payer: BLUE CROSS/BLUE SHIELD | Source: Ambulatory Visit | Attending: Family Medicine | Admitting: Family Medicine

## 2018-04-02 ENCOUNTER — Ambulatory Visit (INDEPENDENT_AMBULATORY_CARE_PROVIDER_SITE_OTHER): Payer: BLUE CROSS/BLUE SHIELD | Admitting: Family Medicine

## 2018-04-02 VITALS — BP 128/86 | HR 82 | Temp 98.4°F | Resp 16 | Ht 67.0 in | Wt 295.3 lb

## 2018-04-02 DIAGNOSIS — R0989 Other specified symptoms and signs involving the circulatory and respiratory systems: Secondary | ICD-10-CM | POA: Insufficient documentation

## 2018-04-02 DIAGNOSIS — R05 Cough: Secondary | ICD-10-CM

## 2018-04-02 DIAGNOSIS — R059 Cough, unspecified: Secondary | ICD-10-CM

## 2018-04-02 DIAGNOSIS — E669 Obesity, unspecified: Secondary | ICD-10-CM | POA: Diagnosis not present

## 2018-04-02 DIAGNOSIS — E1169 Type 2 diabetes mellitus with other specified complication: Secondary | ICD-10-CM

## 2018-04-02 DIAGNOSIS — I10 Essential (primary) hypertension: Secondary | ICD-10-CM

## 2018-04-02 DIAGNOSIS — G43119 Migraine with aura, intractable, without status migrainosus: Secondary | ICD-10-CM

## 2018-04-02 MED ORDER — BUDESONIDE-FORMOTEROL FUMARATE 80-4.5 MCG/ACT IN AERO: 2.0000 | INHALATION_SPRAY | Freq: Two times a day (BID) | RESPIRATORY_TRACT | 3 refills | Status: DC

## 2018-04-02 MED ORDER — GUAIFENESIN-CODEINE 100-10 MG/5ML PO SOLN: 5.0000 mL | Freq: Four times a day (QID) | ORAL | 0 refills | Status: DC | PRN

## 2018-04-02 MED ORDER — OLMESARTAN-AMLODIPINE-HCTZ 40-5-25 MG PO TABS: 1.0000 | ORAL_TABLET | Freq: Every day | ORAL | 0 refills | Status: DC

## 2018-04-02 MED ORDER — ALBUTEROL SULFATE (2.5 MG/3ML) 0.083% IN NEBU: 2.5000 mg | INHALATION_SOLUTION | Freq: Four times a day (QID) | RESPIRATORY_TRACT | 1 refills | Status: DC | PRN

## 2018-04-02 MED ORDER — PREDNISONE 20 MG PO TABS: 20.0000 mg | ORAL_TABLET | Freq: Two times a day (BID) | ORAL | 0 refills | Status: AC

## 2018-04-02 NOTE — Progress Notes (Signed)
Name: Tiffany Nunez   MRN: [id number]    DOB: 05/29/1971   Date:04/02/2018       Progress Note  Subjective  Chief Complaint  Chief Complaint  Patient presents with  . Hypertension    follow up BP elevated  . Cough    not any better    HPI  PT presents for BP check and ongoing cough:  Was seen 03/30/18 with concern for cough - had already tried tessalon without relief. She was given promethazine-DM without relief of cough.  She has not had fevers or chills in 2 days. The coughing is making her headaches worse/not improve. Endorses sore throat, hoarse voice, some body aches (she attributes this to coughing), and shortness of breath with coughing.  She has not been taking her fioricet because she does not want to mix with the cough syrup - advised to continue to hold off on taking fioricet, and we will provide a small amount of Robitussin with codeine along with additional medications for relief.  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  She will require clearance from our office prior to returning to work.  HTN/headaches: BP is at goal today; pain level is still 9/10 in her head, goes to 10/10 when she coughs.  She denies chest pain, BLE edema. Does have shortness of breath with above described cough.  DM: Pt has DM, explained how this can contribute to complex infections. Last A1C was 6.2%.  Discussed that prednisone may increase glucose levels and strict diabetic diet is needed while taking medication.  Patient Active Problem List   Diagnosis Date Noted  . History of cerebrovascular accident (CVA) involving cerebellum 06/12/2017  . Acquired autoimmune hypothyroidism 11/09/2016  . Elevated TSH 07/20/2016  . Vitamin D deficiency 05/03/2015  . Dyslipidemia 05/03/2015  . Gastro-esophageal reflux disease without esophagitis 05/03/2015  . Alopecia 05/03/2015  . Genital herpes 05/03/2015  . Dysmetabolic syndrome 40/98/1191  . Positive H. pylori test 05/03/2015  .  Neuralgia neuritis, sciatic nerve 05/03/2015  . Migraine without aura and responsive to treatment 04/30/2014  . HTN (hypertension) 01/22/2014  . Morbid obesity ([place]) 01/22/2014  . Diabetes mellitus type 2 in obese ([place]) 01/22/2014  . Decreased cardiac ejection fraction 01/22/2014    Social History   Tobacco Use  . Smoking status: Never Smoker  . Smokeless tobacco: Never Used  Substance Use Topics  . Alcohol use: No    Alcohol/week: 0.0 oz     Current Outpatient Medications:  .  aspirin EC 81 MG tablet, Take 1 tablet (81 mg total) by mouth daily., Disp: 100 tablet, Rfl: 2 .  atorvastatin (LIPITOR) 40 MG tablet, Take 1 tablet (40 mg total) by mouth daily., Disp: 90 tablet, Rfl: 3 .  Butalbital-APAP-Caffeine 50-300-40 MG CAPS, Take 1 capsule by mouth every 6 (six) hours as needed., Disp: 60 capsule, Rfl: 0 .  Cholecalciferol (VITAMIN D) 2000 units CAPS, Take 1 capsule (2,000 Units total) by mouth daily., Disp: 100 capsule, Rfl: 2 .  cloNIDine (CATAPRES) 0.2 MG tablet, Take 1 tablet (0.2 mg total) by mouth at bedtime., Disp: 30 tablet, Rfl: 5 .  Erenumab-aooe (AIMOVIG) 70 MG/ML SOAJ, Inject 70 mg into the skin every 30 (thirty) days., Disp: 1 mL, Rfl: 2 .  hydrALAZINE (APRESOLINE) 50 MG tablet, Take 1 tablet (50 mg total) by mouth 2 (two) times daily., Disp: 60 tablet, Rfl: 5 .  ibuprofen (ADVIL,MOTRIN) 800 MG tablet, Take 1 tablet (800 mg total)  by mouth 3 (three) times daily as needed for headache., Disp: 90 tablet, Rfl: 0 .  levothyroxine (SYNTHROID, LEVOTHROID) 50 MCG tablet, TAKE 1/2 TABLET BY MOUTH 3 TIMES WEEKLY, Disp: 30 tablet, Rfl: 1 .  nabumetone (RELAFEN) 500 MG tablet, Take 1 tablet (500 mg total) by mouth daily. Take 1.5 tabs with meal and if migraine still present take 1/2 tab 12 hours later., Disp: 2 tablet, Rfl: 0 .  nadolol (CORGARD) 40 MG tablet, Take 1 tablet (40 mg total) by mouth 2 (two) times daily., Disp: 60 tablet, Rfl: 5 .  Olmesartan-Amlodipine-HCTZ 40-5-25 MG  TABS, TAKE 1 TABLET BY MOUTH EVERY DAY, Disp: 30 tablet, Rfl: 0 .  ondansetron (ZOFRAN-ODT) 4 MG disintegrating tablet, Take 1 tablet (4 mg total) by mouth every 8 (eight) hours as needed for nausea or vomiting., Disp: 20 tablet, Rfl: 0 .  promethazine-dextromethorphan (PROMETHAZINE-DM) 6.25-15 MG/5ML syrup, Take 2.5 mLs by mouth 4 (four) times daily as needed for cough., Disp: 118 mL, Rfl: 0  Allergies  Allergen Reactions  . Imitrex [Sumatriptan] Anaphylaxis  . Topamax [Topiramate] Other (See Comments)    Caused hand numbness     ROS  Ten systems reviewed and is negative except as mentioned in HPI.  Objective  Vitals:   04/02/18 0734  BP: 128/86  Pulse: 82  Resp: 16  Temp: 98.4 F (36.9 C)  TempSrc: Oral  SpO2: 96%  Weight: 295 lb 4.8 oz (133.9 kg)  Height: 5\' 7"  (1.702 m)   Body mass index is 46.25 kg/m.  Nursing Note and Vital Signs reviewed.  Physical Exam  Constitutional: Patient appears well-developed and well-nourished. Obese No distress.  HEENT: head atraumatic, normocephalic, pupils equal and reactive to light, no maxillary or frontal sinus tenderness, neck supple without lymphadenopathy, oropharynx pink and moist without exudate Cardiovascular: Normal rate, regular rhythm, S1/S2 present.  No murmur or rub heard. No BLE edema. Pulmonary/Chest: Effort normal and breath sounds intermittent coarse breath sounds throughout. No respiratory distress or retractions. Psychiatric: Patient has a normal mood and affect. behavior is normal. Judgment and thought content normal.  No results found for this or any previous visit (from the past 72 hour(s)).  Assessment & Plan  1. Cough - guaiFENesin-codeine 100-10 MG/5ML syrup; Take 5 mLs by mouth every 6 (six) hours as needed for cough.  Dispense: 120 mL; Refill: 0 - albuterol (PROVENTIL) (2.5 MG/3ML) 0.083% nebulizer solution; Take 3 mLs (2.5 mg total) by nebulization every 6 (six) hours as needed for wheezing or shortness  of breath.  Dispense: 150 mL; Refill: 1 - budesonide-formoterol (SYMBICORT) 80-4.5 MCG/ACT inhaler; Inhale 2 puffs into the lungs 2 (two) times daily.  Dispense: 1 Inhaler; Refill: 3 - DG Chest 2 View; Future - predniSONE (DELTASONE) 20 MG tablet; Take 1 tablet (20 mg total) by mouth 2 (two) times daily with a meal for 5 days. Take 2nd dose with lunch.  Dispense: 10 tablet; Refill: 0  2. Essential hypertension - Olmesartan-amLODIPine-HCTZ 40-5-25 MG TABS; Take 1 tablet by mouth daily.  Dispense: 90 tablet; Refill: 0  3. Migraine with aura, intractable, without status migrainosus - Hold on fioricet while taking codeine cough syrup.  May take ibuprofen or tylenol. Advised if not improving with cough improvement, she needs to present to urgent or emergent care for further evaluation  4. Diabetes mellitus type 2 in obese [facility]) - Discussed how DM can contribute to complex infections. Last A1C was 6.2%.  Discussed that prednisone may increase glucose levels and strict diabetic diet  is needed while taking medication. - Olmesartan-amLODIPine-HCTZ 40-5-25 MG TABS; Take 1 tablet by mouth daily.  Dispense: 90 tablet; Refill: 0  5. Chest congestion - guaiFENesin-codeine 100-10 MG/5ML syrup; Take 5 mLs by mouth every 6 (six) hours as needed for cough.  Dispense: 120 mL; Refill: 0 - albuterol (PROVENTIL) (2.5 MG/3ML) 0.083% nebulizer solution; Take 3 mLs (2.5 mg total) by nebulization every 6 (six) hours as needed for wheezing or shortness of breath.  Dispense: 150 mL; Refill: 1 - budesonide-formoterol (SYMBICORT) 80-4.5 MCG/ACT inhaler; Inhale 2 puffs into the lungs 2 (two) times daily.  Dispense: 1 Inhaler; Refill: 3 - DG Chest 2 View; Future - predniSONE (DELTASONE) 20 MG tablet; Take 1 tablet (20 mg total) by mouth 2 (two) times daily with a meal for 5 days. Take 2nd dose with lunch.  Dispense: 10 tablet; Refill: 0  -Red flags and when to present for emergency care or RTC including fever >101.21F, chest  pain, shortness of breath, new/worsening/un-resolving symptoms, reviewed with patient at time of visit. Follow up and care instructions discussed and provided in AVS.

## 2018-04-04 ENCOUNTER — Encounter: Payer: Self-pay | Admitting: Emergency Medicine

## 2018-04-04 MED ORDER — ALBUTEROL SULFATE 108 (90 BASE) MCG/ACT IN AEPB: 1.0000 | INHALATION_SPRAY | Freq: Four times a day (QID) | RESPIRATORY_TRACT | 0 refills | Status: DC | PRN

## 2018-04-04 NOTE — Addendum Note (Signed)
Addended by: Hubbard Hartshorn on: 04/04/2018 10:22 AM   Modules accepted: Orders

## 2018-05-09 ENCOUNTER — Ambulatory Visit: Payer: BLUE CROSS/BLUE SHIELD | Admitting: Family Medicine

## 2018-05-19 ENCOUNTER — Other Ambulatory Visit: Payer: Self-pay | Admitting: Family Medicine

## 2018-05-19 DIAGNOSIS — G43009 Migraine without aura, not intractable, without status migrainosus: Secondary | ICD-10-CM

## 2018-05-28 ENCOUNTER — Encounter: Payer: Self-pay | Admitting: Family Medicine

## 2018-05-28 ENCOUNTER — Ambulatory Visit (INDEPENDENT_AMBULATORY_CARE_PROVIDER_SITE_OTHER): Payer: BLUE CROSS/BLUE SHIELD | Admitting: Family Medicine

## 2018-05-28 VITALS — BP 130/88 | HR 81 | Temp 98.3°F | Resp 16 | Ht 67.0 in | Wt 294.3 lb

## 2018-05-28 DIAGNOSIS — E785 Hyperlipidemia, unspecified: Secondary | ICD-10-CM

## 2018-05-28 DIAGNOSIS — Z8673 Personal history of transient ischemic attack (TIA), and cerebral infarction without residual deficits: Secondary | ICD-10-CM

## 2018-05-28 DIAGNOSIS — G43119 Migraine with aura, intractable, without status migrainosus: Secondary | ICD-10-CM

## 2018-05-28 DIAGNOSIS — E039 Hypothyroidism, unspecified: Secondary | ICD-10-CM

## 2018-05-28 DIAGNOSIS — Z23 Encounter for immunization: Secondary | ICD-10-CM | POA: Diagnosis not present

## 2018-05-28 DIAGNOSIS — E559 Vitamin D deficiency, unspecified: Secondary | ICD-10-CM

## 2018-05-28 DIAGNOSIS — I10 Essential (primary) hypertension: Secondary | ICD-10-CM | POA: Diagnosis not present

## 2018-05-28 DIAGNOSIS — E669 Obesity, unspecified: Secondary | ICD-10-CM

## 2018-05-28 DIAGNOSIS — E1169 Type 2 diabetes mellitus with other specified complication: Secondary | ICD-10-CM | POA: Diagnosis not present

## 2018-05-28 DIAGNOSIS — F330 Major depressive disorder, recurrent, mild: Secondary | ICD-10-CM | POA: Insufficient documentation

## 2018-05-28 MED ORDER — ONDANSETRON 4 MG PO TBDP: 4.0000 mg | ORAL_TABLET | Freq: Three times a day (TID) | ORAL | 0 refills | Status: DC | PRN

## 2018-05-28 MED ORDER — BUTALBITAL-APAP-CAFFEINE 50-325-40 MG PO TABS: 1.0000 | ORAL_TABLET | Freq: Four times a day (QID) | ORAL | 0 refills | Status: DC | PRN

## 2018-05-28 MED ORDER — OLMESARTAN-AMLODIPINE-HCTZ 40-5-25 MG PO TABS: 1.0000 | ORAL_TABLET | Freq: Every day | ORAL | 5 refills | Status: DC

## 2018-05-28 MED ORDER — CLONIDINE HCL 0.2 MG PO TABS: 0.2000 mg | ORAL_TABLET | Freq: Every day | ORAL | 1 refills | Status: DC

## 2018-05-28 MED ORDER — HYDRALAZINE HCL 50 MG PO TABS: 50.0000 mg | ORAL_TABLET | Freq: Two times a day (BID) | ORAL | 1 refills | Status: DC

## 2018-05-28 MED ORDER — NADOLOL 40 MG PO TABS: 40.0000 mg | ORAL_TABLET | Freq: Two times a day (BID) | ORAL | 5 refills | Status: DC

## 2018-05-28 MED ORDER — ATORVASTATIN CALCIUM 40 MG PO TABS: 40.0000 mg | ORAL_TABLET | ORAL | 1 refills | Status: DC

## 2018-05-28 NOTE — Progress Notes (Signed)
Name: Tiffany Nunez   MRN: [id number]    DOB: [birth year]   Date:05/28/2018       Progress Note  Subjective  Chief Complaint  Chief Complaint  Patient presents with  . Medication Management    patient stated that she needs a better fluid pill. although one of her medications has a fluid component in it her legs still swell at the end of the day.  . Hypertension  . Migraine  . Obesity  . Hypothyroidism  . Dyslipidemia  . Diabetes  . Immunizations    flu shot  . Labs Only    fasting    HPI  DMII :hgbA1C 6.2%  .We stopped Metformin in 2017 because of diarrhea. Shewason Trulicity since Fall [id number] tolerating well, but got tired of giving herself injections weekly and also stopped because of cost and stopped taking medications.She denies polyphagia, polydipsia or polyuria.Weight is stable. She is due for eye exam, and hgbA1C.   Migraine headaches: Migraine is described as frontal pain, throbbing like, associated with photophobia and nausea, sometimes vomiting. She described the aura as scotomas. Aimovig helped but she is afraid given self shots. She has been out of Fioricet, episodes are about daily now because stress is so high.   Major Depression: she has a history of recurrent depression, could not get financial aid for her daughter and has to borrow her mother's car to drive her daughter to school every day. She states sometimes she feels like running away, but not suicidal. She is involved in her church.   Hypothyroidism:she stopped seeing [name] and has been out of Synthroid for months.Last TSH was elevated, we will recheck level today. She has not been taking medication. Out for months   HTN:bp is not controlled, she denies chest pain or palpitation. BP is at goal, she needs refills of medication.   Morbid obesity: she has not been eating well and is not exercising, trying to change her life style She has been very stressed and has not been able to follow a  life style modification   Patient Active Problem List   Diagnosis Date Noted  . Mild episode of recurrent major depressive disorder ([place]) 05/28/2018  . History of cerebrovascular accident (CVA) involving cerebellum 06/12/2017  . Acquired autoimmune hypothyroidism 11/09/2016  . Vitamin D deficiency 05/03/2015  . Dyslipidemia 05/03/2015  . Gastro-esophageal reflux disease without esophagitis 05/03/2015  . Alopecia 05/03/2015  . Genital herpes 05/03/2015  . Dysmetabolic syndrome 84/69/6295  . Positive H. pylori test 05/03/2015  . Neuralgia neuritis, sciatic nerve 05/03/2015  . Migraine without aura and responsive to treatment 04/30/2014  . HTN (hypertension) 01/22/2014  . Morbid obesity ([place]) 01/22/2014  . Diabetes mellitus type 2 in obese ([place]) 01/22/2014  . Decreased cardiac ejection fraction 01/22/2014    Past Surgical History:  Procedure Laterality Date  . ABDOMINAL HYSTERECTOMY    . ANKLE SURGERY     right  . TUBAL LIGATION      Family History  Problem Relation Age of Onset  . Hypertension Father   . Hyperlipidemia Father   . Hyperlipidemia Mother   . Hypertension Mother   . Hypertension Paternal Aunt   . Cancer Maternal Grandmother        breast    Social History   Socioeconomic History  . Marital status: Divorced    Spouse name: Not on file  . Number of children: 3  . Years of education: Not on file  . Highest education level: Associate  degree: occupational, technical, or vocational program  Occupational History    Comment: [employer]  Social Needs  . Financial resource strain: Somewhat hard  . Food insecurity:    Worry: Sometimes true    Inability: Patient refused  . Transportation needs:    Medical: Yes    Non-medical: Yes  Tobacco Use  . Smoking status: Never Smoker  . Smokeless tobacco: Never Used  Substance and Sexual Activity  . Alcohol use: No    Alcohol/week: 0.0 standard drinks  . Drug use: No  . Sexual activity: Not Currently     Partners: Male  Lifestyle  . Physical activity:    Days per week: 2 days    Minutes per session: 30 min  . Stress: Rather much  Relationships  . Social connections:    Talks on phone: More than three times a week    Gets together: More than three times a week    Attends religious service: More than 4 times per year    Active member of club or organization: Yes    Attends meetings of clubs or organizations: More than 4 times per year    Relationship status: Divorced  . Intimate partner violence:    Fear of current or ex partner: No    Emotionally abused: No    Physically abused: No    Forced sexual activity: No  Other Topics Concern  . Not on file  Social History Narrative  . Not on file     Current Outpatient Medications:  .  Albuterol Sulfate (PROAIR RESPICLICK) 924 (90 Base) MCG/ACT AEPB, Inhale 1-2 puffs into the lungs every 6 (six) hours as needed (Wheezing or Shortness of breath)., Disp: 1 each, Rfl: 0 .  aspirin EC 81 MG tablet, Take 1 tablet (81 mg total) by mouth daily., Disp: 100 tablet, Rfl: 2 .  atorvastatin (LIPITOR) 40 MG tablet, Take 1 tablet (40 mg total) by mouth every other day., Disp: 45 tablet, Rfl: 1 .  budesonide-formoterol (SYMBICORT) 80-4.5 MCG/ACT inhaler, Inhale 2 puffs into the lungs 2 (two) times daily., Disp: 1 Inhaler, Rfl: 3 .  cloNIDine (CATAPRES) 0.2 MG tablet, Take 1 tablet (0.2 mg total) by mouth at bedtime., Disp: 90 tablet, Rfl: 1 .  Erenumab-aooe (AIMOVIG) 70 MG/ML SOAJ, Inject 70 mg into the skin every 30 (thirty) days., Disp: 1 mL, Rfl: 2 .  hydrALAZINE (APRESOLINE) 50 MG tablet, Take 1 tablet (50 mg total) by mouth 2 (two) times daily., Disp: 90 tablet, Rfl: 1 .  ibuprofen (ADVIL,MOTRIN) 800 MG tablet, TAKE 1 TABLET BY MOUTH THREE TIMES DAILY AS NEEDED FOR HEADACHE, Disp: 90 tablet, Rfl: 1 .  levothyroxine (SYNTHROID, LEVOTHROID) 50 MCG tablet, TAKE 1/2 TABLET BY MOUTH 3 TIMES WEEKLY, Disp: 30 tablet, Rfl: 1 .  nadolol (CORGARD) 40 MG  tablet, Take 1 tablet (40 mg total) by mouth 2 (two) times daily., Disp: 60 tablet, Rfl: 5 .  Olmesartan-amLODIPine-HCTZ 40-5-25 MG TABS, Take 1 tablet by mouth daily., Disp: 30 tablet, Rfl: 5 .  ondansetron (ZOFRAN-ODT) 4 MG disintegrating tablet, Take 1 tablet (4 mg total) by mouth every 8 (eight) hours as needed for nausea or vomiting., Disp: 20 tablet, Rfl: 0 .  butalbital-acetaminophen-caffeine (FIORICET, ESGIC) 50-325-40 MG tablet, Take 1-2 tablets by mouth every 6 (six) hours as needed for headache., Disp: 60 tablet, Rfl: 0 .  Cholecalciferol (VITAMIN D) 2000 units CAPS, Take 1 capsule (2,000 Units total) by mouth daily. (Patient not taking: Reported on 05/28/2018), Disp: 100 capsule,  Rfl: 2  Allergies  Allergen Reactions  . Imitrex [Sumatriptan] Anaphylaxis  . Topamax [Topiramate] Other (See Comments)    Caused hand numbness      ROS  Constitutional: Negative for fever or weight change.  Respiratory: Negative for cough and shortness of breath.   Cardiovascular: Negative for chest pain or palpitations.  Gastrointestinal: Negative for abdominal pain, no bowel changes.  Musculoskeletal: Negative for gait problem or joint swelling.  Skin: Negative for rash.  Neurological: Negative for dizziness, positive for intermittent  headache.  No other specific complaints in a complete review of systems (except as listed in HPI above).  Objective  Vitals:   05/28/18 0820  BP: 130/88  Pulse: 81  Resp: 16  Temp: 98.3 F (36.8 C)  TempSrc: Oral  SpO2: 98%  Weight: 294 lb 4.8 oz (133.5 kg)  Height: 5\' 7"  (1.702 m)    Body mass index is 46.09 kg/m.  Physical Exam  Constitutional: Patient appears well-developed and well-nourished. Obese  No distress.  HEENT: head atraumatic, normocephalic, pupils equal and reactive to light,neck supple, throat within normal limits Cardiovascular: Normal rate, regular rhythm and normal heart sounds.  No murmur heard. No BLE edema. Pulmonary/Chest:  Effort normal and breath sounds normal. No respiratory distress. Abdominal: Soft.  There is no tenderness. Psychiatric: Patient has a normal mood and affect. behavior is normal. Judgment and thought content normal.   Diabetic Foot Exam: Diabetic Foot Exam - Simple   Simple Foot Form Diabetic Foot exam was performed with the following findings:  Yes 05/28/2018  9:12 AM  Visual Inspection No deformities, no ulcerations, no other skin breakdown bilaterally:  Yes Sensation Testing Intact to touch and monofilament testing bilaterally:  Yes Pulse Check Posterior Tibialis and Dorsalis pulse intact bilaterally:  Yes Comments      PHQ2/9: Depression screen [facility] 2/9 05/28/2018 04/02/2018 11/09/2016 07/21/2016 07/19/2016  Decreased Interest 0 0 0 0 0  Down, Depressed, Hopeless 1 0 0 0 0  PHQ - 2 Score 1 0 0 0 0  Altered sleeping 3 0 - - -  Tired, decreased energy 3 0 - - -  Change in appetite 3 0 - - -  Feeling bad or failure about yourself  0 0 - - -  Trouble concentrating 0 0 - - -  Moving slowly or fidgety/restless 0 0 - - -  Suicidal thoughts 0 0 - - -  PHQ-9 Score 10 0 - - -     Fall Risk: Fall Risk  05/28/2018 03/30/2018 09/20/2017 08/14/2017 11/09/2016  Falls in the past year? No No No No No     Functional Status Survey: Is the patient deaf or have difficulty hearing?: No Does the patient have difficulty seeing, even when wearing glasses/contacts?: No Does the patient have difficulty concentrating, remembering, or making decisions?: No Does the patient have difficulty walking or climbing stairs?: No Does the patient have difficulty dressing or bathing?: No Does the patient have difficulty doing errands alone such as visiting a doctor's office or shopping?: No    Assessment & Plan  1. Diabetes mellitus type 2 in obese ([facility])  - Hemoglobin A1c - Olmesartan-amLODIPine-HCTZ 40-5-25 MG TABS; Take 1 tablet by mouth daily.  Dispense: 30 tablet; Refill: 5  2. Essential  hypertension  - CBC with Differential/Platelet - COMPLETE METABOLIC PANEL WITH GFR - cloNIDine (CATAPRES) 0.2 MG tablet; Take 1 tablet (0.2 mg total) by mouth at bedtime.  Dispense: 90 tablet; Refill: 1 - Olmesartan-amLODIPine-HCTZ 40-5-25 MG TABS; Take  1 tablet by mouth daily.  Dispense: 30 tablet; Refill: 5 - nadolol (CORGARD) 40 MG tablet; Take 1 tablet (40 mg total) by mouth 2 (two) times daily.  Dispense: 60 tablet; Refill: 5 - hydrALAZINE (APRESOLINE) 50 MG tablet; Take 1 tablet (50 mg total) by mouth 2 (two) times daily.  Dispense: 90 tablet; Refill: 1  3. Needs flu shot  - Flu Vaccine QUAD 36+ mos IM  4. Migraine with aura, intractable, without status migrainosus  - ondansetron (ZOFRAN-ODT) 4 MG disintegrating tablet; Take 1 tablet (4 mg total) by mouth every 8 (eight) hours as needed for nausea or vomiting.  Dispense: 20 tablet; Refill: 0 - butalbital-acetaminophen-caffeine (FIORICET, ESGIC) 50-325-40 MG tablet; Take 1-2 tablets by mouth every 6 (six) hours as needed for headache.  Dispense: 60 tablet; Refill: 0  5. Dyslipidemia  - Lipid panel  6. Morbid obesity, unspecified obesity type [facility])  Discussed with the patient the risk posed by an increased BMI. Discussed importance of portion control, calorie counting and at least 150 minutes of physical activity weekly. Avoid sweet beverages and drink more water. Eat at least 6 servings of fruit and vegetables daily   7. Vitamin D deficiency  - VITAMIN D 25 Hydroxy (Vit-D Deficiency, Fractures)  8. Acquired hypothyroidism  - TSH  9. History of cerebrovascular accident (CVA) involving cerebellum  - atorvastatin (LIPITOR) 40 MG tablet; Take 1 tablet (40 mg total) by mouth every other day.  Dispense: 45 tablet; Refill: 1  10. Mild episode of recurrent major depressive disorder ([place])  She refuses medication but wants to see psychologist, she will contact her insurance to find out who is in network.

## 2018-05-29 LAB — COMPLETE METABOLIC PANEL WITH GFR
AG Ratio: 1.2 (calc) (ref 1.0–2.5)
ALKALINE PHOSPHATASE (APISO): 88 U/L (ref 33–115)
ALT: 48 U/L — ABNORMAL HIGH (ref 6–29)
AST: 29 U/L (ref 10–35)
Albumin: 4.1 g/dL (ref 3.6–5.1)
BUN: 18 mg/dL (ref 7–25)
CO2: 28 mmol/L (ref 20–32)
CREATININE: 1.08 mg/dL (ref 0.50–1.10)
Calcium: 10.1 mg/dL (ref 8.6–10.2)
Chloride: 101 mmol/L (ref 98–110)
GFR, Est African American: 71 mL/min/{1.73_m2} (ref >=60)
GFR, Est Non African American: 61 mL/min/{1.73_m2} (ref >=60)
GLUCOSE: 121 mg/dL (ref 65–139)
Globulin: 3.3 g/dL (calc) (ref 1.9–3.7)
Potassium: 3.5 mmol/L (ref 3.5–5.3)
Sodium: 139 mmol/L (ref 135–146)
TOTAL PROTEIN: 7.4 g/dL (ref 6.1–8.1)
Total Bilirubin: 0.4 mg/dL (ref 0.2–1.2)

## 2018-05-29 LAB — LIPID PANEL
Cholesterol: 199 mg/dL (ref <200)
HDL: 50 mg/dL — ABNORMAL LOW (ref >50)
LDL CHOLESTEROL (CALC): 129 mg/dL — AB
Non-HDL Cholesterol (Calc): 149 mg/dL (calc) — ABNORMAL HIGH (ref <130)
Total CHOL/HDL Ratio: 4 (calc) (ref <5.0)
Triglycerides: 92 mg/dL (ref <150)

## 2018-05-29 LAB — CBC WITH DIFFERENTIAL/PLATELET
BASOS PCT: 0.9 %
Basophils Absolute: 48 cells/uL (ref 0–200)
EOS PCT: 1.9 %
Eosinophils Absolute: 101 cells/uL (ref 15–500)
HCT: 41.3 % (ref 35.0–45.0)
HEMOGLOBIN: 14.1 g/dL (ref 11.7–15.5)
Lymphs Abs: 2056 cells/uL (ref 850–3900)
MCH: 29.7 pg (ref 27.0–33.0)
MCHC: 34.1 g/dL (ref 32.0–36.0)
MCV: 86.9 fL (ref 80.0–100.0)
MONOS PCT: 7.7 %
MPV: 10.7 fL (ref 7.5–12.5)
NEUTROS ABS: 2687 {cells}/uL (ref 1500–7800)
Neutrophils Relative %: 50.7 %
Platelets: 417 10*3/uL — ABNORMAL HIGH (ref 140–400)
RBC: 4.75 10*6/uL (ref 3.80–5.10)
RDW: 14.1 % (ref 11.0–15.0)
Total Lymphocyte: 38.8 %
WBC mixed population: 408 cells/uL (ref 200–950)
WBC: 5.3 10*3/uL (ref 3.8–10.8)

## 2018-05-29 LAB — HEMOGLOBIN A1C
HEMOGLOBIN A1C: 6.2 %{Hb} — AB (ref <5.7)
Mean Plasma Glucose: 131 (calc)
eAG (mmol/L): 7.3 (calc)

## 2018-05-29 LAB — TSH: TSH: 3.46 mIU/L

## 2018-05-29 LAB — VITAMIN D 25 HYDROXY (VIT D DEFICIENCY, FRACTURES): Vit D, 25-Hydroxy: 18 ng/mL — ABNORMAL LOW (ref 30–100)

## 2018-05-31 ENCOUNTER — Other Ambulatory Visit: Payer: Self-pay | Admitting: Family Medicine

## 2018-05-31 MED ORDER — VITAMIN D (ERGOCALCIFEROL) 1.25 MG (50000 UNIT) PO CAPS: 50000.0000 [IU] | ORAL_CAPSULE | ORAL | 0 refills | Status: DC

## 2018-08-23 ENCOUNTER — Encounter: Payer: BLUE CROSS/BLUE SHIELD | Admitting: Family Medicine

## 2018-09-22 ENCOUNTER — Other Ambulatory Visit: Payer: Self-pay | Admitting: Family Medicine

## 2018-09-25 ENCOUNTER — Other Ambulatory Visit: Payer: Self-pay | Admitting: Family Medicine

## 2018-09-25 DIAGNOSIS — I10 Essential (primary) hypertension: Secondary | ICD-10-CM

## 2018-09-25 NOTE — Telephone Encounter (Signed)
Refill request for general medication. Hydralazine to [facility]  Last office visit 05/28/2018   No follow-ups on file.

## 2018-09-25 NOTE — Telephone Encounter (Signed)
Tried calling pt. No answer, no option for VM

## 2018-11-01 ENCOUNTER — Other Ambulatory Visit: Payer: Self-pay | Admitting: Family Medicine

## 2018-11-01 DIAGNOSIS — I10 Essential (primary) hypertension: Secondary | ICD-10-CM

## 2018-11-01 NOTE — Telephone Encounter (Signed)
She needs follow up

## 2018-11-01 NOTE — Telephone Encounter (Signed)
Spoke with patient she did schedule for 3.3.2020 (not able to come in sooner because her job requires a 3wk notice). She stated that she is completely out and would like to know if you would give her enough to last until her appt.

## 2018-11-20 ENCOUNTER — Ambulatory Visit (INDEPENDENT_AMBULATORY_CARE_PROVIDER_SITE_OTHER): Payer: BLUE CROSS/BLUE SHIELD | Admitting: Family Medicine

## 2018-11-20 ENCOUNTER — Encounter: Payer: Self-pay | Admitting: Family Medicine

## 2018-11-20 VITALS — BP 130/80 | HR 78 | Temp 97.9°F | Resp 16 | Ht 67.0 in | Wt 307.3 lb

## 2018-11-20 DIAGNOSIS — E669 Obesity, unspecified: Secondary | ICD-10-CM

## 2018-11-20 DIAGNOSIS — I10 Essential (primary) hypertension: Secondary | ICD-10-CM

## 2018-11-20 DIAGNOSIS — E785 Hyperlipidemia, unspecified: Secondary | ICD-10-CM

## 2018-11-20 DIAGNOSIS — E1169 Type 2 diabetes mellitus with other specified complication: Secondary | ICD-10-CM | POA: Diagnosis not present

## 2018-11-20 DIAGNOSIS — E063 Autoimmune thyroiditis: Secondary | ICD-10-CM

## 2018-11-20 DIAGNOSIS — R42 Dizziness and giddiness: Secondary | ICD-10-CM

## 2018-11-20 DIAGNOSIS — G43119 Migraine with aura, intractable, without status migrainosus: Secondary | ICD-10-CM

## 2018-11-20 DIAGNOSIS — Z8673 Personal history of transient ischemic attack (TIA), and cerebral infarction without residual deficits: Secondary | ICD-10-CM

## 2018-11-20 DIAGNOSIS — F330 Major depressive disorder, recurrent, mild: Secondary | ICD-10-CM

## 2018-11-20 DIAGNOSIS — E559 Vitamin D deficiency, unspecified: Secondary | ICD-10-CM

## 2018-11-20 LAB — POCT GLYCOSYLATED HEMOGLOBIN (HGB A1C): Hemoglobin A1C: 6.4 % — AB (ref 4.0–5.6)

## 2018-11-20 LAB — TSH: TSH: 3.83 mIU/L

## 2018-11-20 MED ORDER — ATORVASTATIN CALCIUM 40 MG PO TABS: 40.0000 mg | ORAL_TABLET | ORAL | 3 refills | Status: DC

## 2018-11-20 MED ORDER — ONDANSETRON 4 MG PO TBDP: 4.0000 mg | ORAL_TABLET | Freq: Three times a day (TID) | ORAL | 0 refills | Status: DC | PRN

## 2018-11-20 MED ORDER — OLMESARTAN-AMLODIPINE-HCTZ 40-5-25 MG PO TABS: 1.0000 | ORAL_TABLET | Freq: Every day | ORAL | 3 refills | Status: DC

## 2018-11-20 MED ORDER — BUTALBITAL-APAP-CAFFEINE 50-325-40 MG PO TABS: 1.0000 | ORAL_TABLET | Freq: Four times a day (QID) | ORAL | 0 refills | Status: DC | PRN

## 2018-11-20 MED ORDER — HYDRALAZINE HCL 50 MG PO TABS: 50.0000 mg | ORAL_TABLET | Freq: Two times a day (BID) | ORAL | 3 refills | Status: DC

## 2018-11-20 MED ORDER — NADOLOL 40 MG PO TABS: 40.0000 mg | ORAL_TABLET | Freq: Two times a day (BID) | ORAL | 3 refills | Status: DC

## 2018-11-20 NOTE — Patient Instructions (Signed)
Benign Positional Vertigo  Vertigo is the feeling that you or your surroundings are moving when they are not. Benign positional vertigo is the most common form of vertigo. This is usually a harmless condition (benign). This condition is positional. This means that symptoms are triggered by certain movements and positions.  This condition can be dangerous if it occurs while you are doing something that could cause harm to you or others. This includes activities such as driving or operating machinery.  What are the causes?  In many cases, the cause of this condition is not known. It may be caused by a disturbance in an area of the inner ear that helps your brain to sense movement and balance. This disturbance can be caused by:   Viral infection (labyrinthitis).   Head injury.   Repetitive motion, such as jumping, dancing, or running.  What increases the risk?  You are more likely to develop this condition if:   You are a woman.   You are 50 years of age or older.  What are the signs or symptoms?  Symptoms of this condition usually happen when you move your head or your eyes in different directions. Symptoms may start suddenly, and usually last for less than a minute. They include:   Loss of balance and falling.   Feeling like you are spinning or moving.   Feeling like your surroundings are spinning or moving.   Nausea and vomiting.   Blurred vision.   Dizziness.   Involuntary eye movement (nystagmus).  Symptoms can be mild and cause only minor problems, or they can be severe and interfere with daily life. Episodes of benign positional vertigo may return (recur) over time. Symptoms may improve over time.  How is this diagnosed?  This condition may be diagnosed based on:   Your medical history.   Physical exam of the head, neck, and ears.   Tests, such as:  ? MRI.  ? CT scan.  ? Eye movement tests. Your health care provider may ask you to change positions quickly while he or she watches you for symptoms  of benign positional vertigo, such as nystagmus. Eye movement may be tested with a variety of exams that are designed to evaluate or stimulate vertigo.  ? An electroencephalogram (EEG). This records electrical activity in your brain.  ? Hearing tests.  You may be referred to a health care provider who specializes in ear, nose, and throat (ENT) problems (otolaryngologist) or a provider who specializes in disorders of the nervous system (neurologist).  How is this treated?    This condition may be treated in a session in which your health care provider moves your head in specific positions to adjust your inner ear back to normal. Treatment for this condition may take several sessions. Surgery may be needed in severe cases, but this is rare.  In some cases, benign positional vertigo may resolve on its own in 2-4 weeks.  Follow these instructions at home:  Safety   Move slowly. Avoid sudden body or head movements or certain positions, as told by your health care provider.   Avoid driving until your health care provider says it is safe for you to do so.   Avoid operating heavy machinery until your health care provider says it is safe for you to do so.   Avoid doing any tasks that would be dangerous to you or others if vertigo occurs.   If you have trouble walking or keeping your balance, try using   a cane for stability. If you feel dizzy or unstable, sit down right away.   Return to your normal activities as told by your health care provider. Ask your health care provider what activities are safe for you.  General instructions   Take over-the-counter and prescription medicines only as told by your health care provider.   Drink enough fluid to keep your urine pale yellow.   Keep all follow-up visits as told by your health care provider. This is important.  Contact a health care provider if:   You have a fever.   Your condition gets worse or you develop new symptoms.   Your family or friends notice any  behavioral changes.   You have nausea or vomiting that gets worse.   You have numbness or a "pins and needles" sensation.  Get help right away if you:   Have difficulty speaking or moving.   Are always dizzy.   Faint.   Develop severe headaches.   Have weakness in your legs or arms.   Have changes in your hearing or vision.   Develop a stiff neck.   Develop sensitivity to light.  Summary   Vertigo is the feeling that you or your surroundings are moving when they are not. Benign positional vertigo is the most common form of vertigo.   The cause of this condition is not known. It may be caused by a disturbance in an area of the inner ear that helps your brain to sense movement and balance.   Symptoms include loss of balance and falling, feeling that you or your surroundings are moving, nausea and vomiting, and blurred vision.   This condition can be diagnosed based on symptoms, physical exam, and other tests, such as MRI, CT scan, eye movement tests, and hearing tests.   Follow safety instructions as told by your health care provider. You will also be told when to contact your health care provider in case of problems.  This information is not intended to replace advice given to you by your health care provider. Make sure you discuss any questions you have with your health care provider.  Document Released: 06/13/2006 Document Revised: 02/14/2018 Document Reviewed: 02/14/2018  Elsevier Interactive Patient Education  2019 [employer]

## 2018-11-20 NOTE — Progress Notes (Signed)
Name: Tiffany Nunez   MRN: [id number]    DOB: 01-27-1971   Date:11/20/2018       Progress Note  Subjective  Chief Complaint  Chief Complaint  Patient presents with  . Medication Refill  . Diabetes  . Hypertension    Alot of dizziniess-feels like the room is spinning and has to sit down immediately-nausea  . Migraine    Having them every other day-Butalbital is not helping at all  . Obesity  . Hypothyroidism  . Dyslipidemia  . Depression    HPI  DMII :hgbA1C6.4%.We stopped Metformin in 2017 because of diarrhea. Shewason Trulicity since Fall [id number] tolerating well, but got tired of giving herself injections weekly and also stopped because of cost and stopped taking medications.She denies polyphagia, polydipsia or polyuria.Weight is stable. She is due for eye exam. She has gained weight since last visit, she is skipping meals and drinking sodas all day and eats at night.   Migraine headaches: Migraine is described as frontal pain, throbbing like, associated with photophobia and nausea, sometimes vomiting. She described the aura as scotomas. Aimovig helped but it was too expensive and not sure if helped that much . She is taking fioricet but does not seem to be helping as much, she has been taking a lot of otc medication and medication does not seem to help. She states has to drink caffeine to help control headaches. She cannot tolerate triptans . Discussed cutting down on caffeine intake and go to neuorlogist   Major Depression: she has a history of recurrent depression, daughter is at school but now has drama with her roommate and is still stressed. She is also getting problems with [employer]. She denies suicidal thoughts or ideation  Hypothyroidism:she stopped seeing [name] and has been out of Synthroid for months.Last TSH was normal without medication, we will recheck it today   HTN:bp is not controlled, she denies chest pain or palpitation. BP is at goal, she needs  refills of medication. She is off clonidine, stopped on her own  Morbid obesity: she has been drinking sodas during the day and only eats at night, not very physically active. She is drinking about 32 oz daily. Discussed cutting down, and eating breakfast, may skip lunch and eat early dinner, no sweet beverages.   History of CVA: she has noticed dizziness lately, spinning sensation. She states usually starts on left side, spinning sensation, it can happen at rest or stopping down. No tinnitus or hearing loss. She had episodes in the past but seems more frequent lately, not associated with headache. She states three days in a row this past week.    Patient Active Problem List   Diagnosis Date Noted  . Mild episode of recurrent major depressive disorder ([place]) 05/28/2018  . History of cerebrovascular accident (CVA) involving cerebellum 06/12/2017  . Acquired autoimmune hypothyroidism 11/09/2016  . Vitamin D deficiency 05/03/2015  . Dyslipidemia 05/03/2015  . Gastro-esophageal reflux disease without esophagitis 05/03/2015  . Alopecia 05/03/2015  . Genital herpes 05/03/2015  . Dysmetabolic syndrome 45/80/9983  . Positive H. pylori test 05/03/2015  . Neuralgia neuritis, sciatic nerve 05/03/2015  . Migraine without aura and responsive to treatment 04/30/2014  . HTN (hypertension) 01/22/2014  . Morbid obesity ([place]) 01/22/2014  . Diabetes mellitus type 2 in obese ([place]) 01/22/2014  . Decreased cardiac ejection fraction 01/22/2014    Past Surgical History:  Procedure Laterality Date  . ABDOMINAL HYSTERECTOMY    . ANKLE SURGERY     right  .  TUBAL LIGATION      Family History  Problem Relation Age of Onset  . Hypertension Father   . Hyperlipidemia Father   . Hyperlipidemia Mother   . Hypertension Mother   . Hypertension Paternal Aunt   . Cancer Maternal Grandmother        breast    Social History   Socioeconomic History  . Marital status: Divorced    Spouse name: Not on file  .  Number of children: 3  . Years of education: Not on file  . Highest education level: Associate degree: occupational, Hotel manager, or vocational program  Occupational History    Comment: [employer]  Social Needs  . Financial resource strain: Somewhat hard  . Food insecurity:    Worry: Sometimes true    Inability: Patient refused  . Transportation needs:    Medical: Yes    Non-medical: Yes  Tobacco Use  . Smoking status: Never Smoker  . Smokeless tobacco: Never Used  Substance and Sexual Activity  . Alcohol use: No    Alcohol/week: 0.0 standard drinks  . Drug use: No  . Sexual activity: Not Currently    Partners: Male  Lifestyle  . Physical activity:    Days per week: 2 days    Minutes per session: 30 min  . Stress: Rather much  Relationships  . Social connections:    Talks on phone: More than three times a week    Gets together: More than three times a week    Attends religious service: More than 4 times per year    Active member of club or organization: Yes    Attends meetings of clubs or organizations: More than 4 times per year    Relationship status: Divorced  . Intimate partner violence:    Fear of current or ex partner: No    Emotionally abused: No    Physically abused: No    Forced sexual activity: No  Other Topics Concern  . Not on file  Social History Narrative  . Not on file     Current Outpatient Medications:  .  Albuterol Sulfate (PROAIR RESPICLICK) 867 (90 Base) MCG/ACT AEPB, Inhale 1-2 puffs into the lungs every 6 (six) hours as needed (Wheezing or Shortness of breath)., Disp: 1 each, Rfl: 0 .  aspirin EC 81 MG tablet, Take 1 tablet (81 mg total) by mouth daily., Disp: 100 tablet, Rfl: 2 .  atorvastatin (LIPITOR) 40 MG tablet, Take 1 tablet (40 mg total) by mouth every other day., Disp: 15 tablet, Rfl: 3 .  ibuprofen (ADVIL,MOTRIN) 800 MG tablet, TAKE 1 TABLET BY MOUTH THREE TIMES DAILY AS NEEDED FOR HEADACHE, Disp: 90 tablet, Rfl: 1 .  nadolol (CORGARD)  40 MG tablet, Take 1 tablet (40 mg total) by mouth 2 (two) times daily., Disp: 60 tablet, Rfl: 3 .  Olmesartan-amLODIPine-HCTZ 40-5-25 MG TABS, Take 1 tablet by mouth daily., Disp: 30 tablet, Rfl: 3 .  ondansetron (ZOFRAN-ODT) 4 MG disintegrating tablet, Take 1 tablet (4 mg total) by mouth every 8 (eight) hours as needed for nausea or vomiting., Disp: 20 tablet, Rfl: 0 .  butalbital-acetaminophen-caffeine (FIORICET, ESGIC) 50-325-40 MG tablet, Take 1-2 tablets by mouth every 6 (six) hours as needed for headache., Disp: 60 tablet, Rfl: 0 .  Cholecalciferol (VITAMIN D) 2000 units CAPS, Take 1 capsule (2,000 Units total) by mouth daily. (Patient not taking: Reported on 05/28/2018), Disp: 100 capsule, Rfl: 2 .  hydrALAZINE (APRESOLINE) 50 MG tablet, Take 1 tablet (50 mg total)  by mouth 2 (two) times daily., Disp: 60 tablet, Rfl: 3  Allergies  Allergen Reactions  . Imitrex [Sumatriptan] Anaphylaxis  . Topamax [Topiramate] Other (See Comments)    Caused hand numbness     I personally reviewed active problem list, medication list, allergies, family history, social history with the patient/caregiver today.   ROS  Constitutional: Negative for fever, positive for weight change.  Respiratory: Negative for cough and shortness of breath.   Cardiovascular: Negative for chest pain or palpitations.  Gastrointestinal: Negative for abdominal pain, no bowel changes.  Musculoskeletal: Negative for gait problem or joint swelling.  Skin: Negative for rash.  Neurological: Negative for dizziness or headache.  No other specific complaints in a complete review of systems (except as listed in HPI above).  Objective  Vitals:   11/20/18 0942  BP: 130/80  Pulse: 78  Resp: 16  Temp: 97.9 F (36.6 C)  TempSrc: Oral  SpO2: 99%  Weight: (!) 307 lb 4.8 oz (139.4 kg)  Height: 5\' 7"  (1.702 m)    Body mass index is 48.13 kg/m.  Physical Exam  Constitutional: Patient appears well-developed and well-nourished.  Obese  No distress.  HEENT: head atraumatic, normocephalic, pupils equal and reactive to light, neck supple, throat within normal limits Cardiovascular: Normal rate, regular rhythm and normal heart sounds.  No murmur heard. No BLE edema. Pulmonary/Chest: Effort normal and breath sounds normal. No respiratory distress. Abdominal: Soft.  There is no tenderness. Psychiatric: Patient has a normal mood and affect. behavior is normal. Judgment and thought content normal.  Recent Results (from the past 2160 hour(s))  POCT HgB A1C     Status: Abnormal   Collection Time: 11/20/18  9:54 AM  Result Value Ref Range   Hemoglobin A1C 6.4 (A) 4.0 - 5.6 %   HbA1c POC (<> result, manual entry)     HbA1c, POC (prediabetic range)     HbA1c, POC (controlled diabetic range)       PHQ2/9: Depression screen [facility] 2/9 11/20/2018 05/28/2018 04/02/2018 11/09/2016 07/21/2016  Decreased Interest 0 0 0 0 0  Down, Depressed, Hopeless 0 1 0 0 0  PHQ - 2 Score 0 1 0 0 0  Altered sleeping 2 3 0 - -  Tired, decreased energy 1 3 0 - -  Change in appetite 0 3 0 - -  Feeling bad or failure about yourself  0 0 0 - -  Trouble concentrating 0 0 0 - -  Moving slowly or fidgety/restless 0 0 0 - -  Suicidal thoughts 0 0 0 - -  PHQ-9 Score 3 10 0 - -  Difficult doing work/chores Not difficult at all - - - -     Fall Risk: Fall Risk  11/20/2018 05/28/2018 03/30/2018 09/20/2017 08/14/2017  Falls in the past year? 1 No No No No  Number falls in past yr: 1 - - - -  Injury with Fall? 0 - - - -  Risk for fall due to : Impaired balance/gait - - - -     Functional Status Survey: Is the patient deaf or have difficulty hearing?: No Does the patient have difficulty seeing, even when wearing glasses/contacts?: Yes Does the patient have difficulty concentrating, remembering, or making decisions?: Yes(Remembering) Does the patient have difficulty walking or climbing stairs?: No Does the patient have difficulty dressing or bathing?: No Does  the patient have difficulty doing errands alone such as visiting a doctor's office or shopping?: No    Assessment & Plan  1. Diabetes mellitus type 2 in obese (H[facility])  - Urine Microalbumin w/creat. ratio - POCT HgB A1C - Olmesartan-amLODIPine-HCTZ 40-5-25 MG TABS; Take 1 tablet by mouth daily.  Dispense: 30 tablet; Refill: 3  2. Essential hypertension  At goal  - Olmesartan-amLODIPine-HCTZ 40-5-25 MG TABS; Take 1 tablet by mouth daily.  Dispense: 30 tablet; Refill: 3 - hydrALAZINE (APRESOLINE) 50 MG tablet; Take 1 tablet (50 mg total) by mouth 2 (two) times daily.  Dispense: 60 tablet; Refill: 3 - nadolol (CORGARD) 40 MG tablet; Take 1 tablet (40 mg total) by mouth 2 (two) times daily.  Dispense: 60 tablet; Refill: 3  3. Dyslipidemia  Continue atorvastatin   4. Morbid obesity, unspecified obesity type [facility])  Discussed with the patient the risk posed by an increased BMI. Discussed importance of portion control, calorie counting and at least 150 minutes of physical activity weekly. Avoid sweet beverages and drink more water. Eat at least 6 servings of fruit and vegetables daily   5. History of cerebrovascular accident (CVA) involving cerebellum  - atorvastatin (LIPITOR) 40 MG tablet; Take 1 tablet (40 mg total) by mouth every other day.  Dispense: 15 tablet; Refill: 3  6. Vitamin D deficiency  Resume Vitamin D otc   7. Acquired autoimmune hypothyroidism  Last level normal   8. Mild episode of recurrent major depressive disorder ([place])  Under more stress   9. Migraine with aura, intractable, without status migrainosus  - ondansetron (ZOFRAN-ODT) 4 MG disintegrating tablet; Take 1 tablet (4 mg total) by mouth every 8 (eight) hours as needed for nausea or vomiting.  Dispense: 20 tablet; Refill: 0 - butalbital-acetaminophen-caffeine (FIORICET, ESGIC) 50-325-40 MG tablet; Take 1-2 tablets by mouth every 6 (six) hours as needed for headache.  Dispense: 60 tablet; Refill: 0 -  Ambulatory referral to Neurology  10. Vertigo  - Ambulatory referral to ENT

## 2018-11-21 LAB — MICROALBUMIN / CREATININE URINE RATIO
CREATININE, URINE: 141 mg/dL (ref 20–275)
MICROALB UR: 0.6 mg/dL
Microalb Creat Ratio: 4 mcg/mg creat (ref <30)

## 2018-11-23 ENCOUNTER — Other Ambulatory Visit: Payer: Self-pay | Admitting: Family Medicine

## 2018-11-23 DIAGNOSIS — G43009 Migraine without aura, not intractable, without status migrainosus: Secondary | ICD-10-CM

## 2018-11-23 MED ORDER — IBUPROFEN 800 MG PO TABS: 800.0000 mg | ORAL_TABLET | Freq: Every day | ORAL | 0 refills | Status: DC | PRN

## 2018-11-23 NOTE — Telephone Encounter (Signed)
Copied from [place] [id number]. Topic: Quick Communication - Rx Refill/Question >> Nov 23, 2018 11:35 AM Virl Axe D wrote: Medication: ibuprofen (ADVIL,MOTRIN) 800 MG tablet / Pt stated at her OV on 11/20/18 this rx was supposed to have been refilled. Please advise.  Has the patient contacted their pharmacy? Yes.   (Agent: If no, request that the patient contact the pharmacy for the refill.) (Agent: If yes, when and what did the pharmacy advise?)  Preferred Pharmacy (with phone number or street name): [id number] [address], [place] - [place] [phone] (Phone) [phone] (Fax)    Agent: Please be advised that RX refills may take up to 3 business days. We ask that you follow-up with your pharmacy.

## 2019-02-18 ENCOUNTER — Other Ambulatory Visit: Payer: Self-pay | Admitting: Family Medicine

## 2019-02-18 DIAGNOSIS — G43009 Migraine without aura, not intractable, without status migrainosus: Secondary | ICD-10-CM

## 2019-03-11 ENCOUNTER — Ambulatory Visit: Payer: Self-pay | Admitting: Family Medicine

## 2019-03-11 ENCOUNTER — Other Ambulatory Visit: Payer: Self-pay

## 2019-03-11 ENCOUNTER — Emergency Department (HOSPITAL_BASED_OUTPATIENT_CLINIC_OR_DEPARTMENT_OTHER)
Admission: EM | Admit: 2019-03-11 | Discharge: 2019-03-11 | Disposition: A | Discharge status: Home / Self Care | Payer: BC Managed Care – PPO | Attending: Emergency Medicine | Admitting: Emergency Medicine

## 2019-03-11 ENCOUNTER — Encounter (HOSPITAL_BASED_OUTPATIENT_CLINIC_OR_DEPARTMENT_OTHER): Payer: Self-pay | Admitting: *Deleted

## 2019-03-11 DIAGNOSIS — Z79899 Other long term (current) drug therapy: Secondary | ICD-10-CM | POA: Diagnosis not present

## 2019-03-11 DIAGNOSIS — R112 Nausea with vomiting, unspecified: Secondary | ICD-10-CM | POA: Insufficient documentation

## 2019-03-11 DIAGNOSIS — R197 Diarrhea, unspecified: Secondary | ICD-10-CM | POA: Diagnosis not present

## 2019-03-11 DIAGNOSIS — R252 Cramp and spasm: Secondary | ICD-10-CM | POA: Insufficient documentation

## 2019-03-11 DIAGNOSIS — I10 Essential (primary) hypertension: Secondary | ICD-10-CM | POA: Insufficient documentation

## 2019-03-11 DIAGNOSIS — E119 Type 2 diabetes mellitus without complications: Secondary | ICD-10-CM | POA: Insufficient documentation

## 2019-03-11 LAB — CBC WITH DIFFERENTIAL/PLATELET
Abs Immature Granulocytes: 0.02 10*3/uL (ref 0.00–0.07)
Basophils Absolute: 0 10*3/uL (ref 0.0–0.1)
Basophils Relative: 1 %
Eosinophils Absolute: 0.1 10*3/uL (ref 0.0–0.5)
Eosinophils Relative: 2 %
HCT: 41.3 % (ref 36.0–46.0)
Hemoglobin: 13.5 g/dL (ref 12.0–15.0)
Immature Granulocytes: 0 %
Lymphocytes Relative: 34 %
Lymphs Abs: 2.3 10*3/uL (ref 0.7–4.0)
MCH: 30.2 pg (ref 26.0–34.0)
MCHC: 32.7 g/dL (ref 30.0–36.0)
MCV: 92.4 fL (ref 80.0–100.0)
Monocytes Absolute: 0.5 10*3/uL (ref 0.1–1.0)
Monocytes Relative: 7 %
Neutro Abs: 3.7 10*3/uL (ref 1.7–7.7)
Neutrophils Relative %: 56 %
Platelets: 377 10*3/uL (ref 150–400)
RBC: 4.47 MIL/uL (ref 3.87–5.11)
RDW: 13.8 % (ref 11.5–15.5)
WBC: 6.6 10*3/uL (ref 4.0–10.5)
nRBC: 0 % (ref 0.0–0.2)

## 2019-03-11 LAB — URINALYSIS, ROUTINE W REFLEX MICROSCOPIC
Bilirubin Urine: NEGATIVE
Glucose, UA: NEGATIVE mg/dL
Hgb urine dipstick: NEGATIVE
Ketones, ur: NEGATIVE mg/dL
Leukocytes,Ua: NEGATIVE
Nitrite: NEGATIVE
Protein, ur: NEGATIVE mg/dL
Specific Gravity, Urine: 1.025 (ref 1.005–1.030)
pH: 6 (ref 5.0–8.0)

## 2019-03-11 LAB — CBG MONITORING, ED: Glucose-Capillary: 112 mg/dL — ABNORMAL HIGH (ref 70–99)

## 2019-03-11 LAB — BASIC METABOLIC PANEL
Anion gap: 7 (ref 5–15)
BUN: 14 mg/dL (ref 6–20)
CO2: 28 mmol/L (ref 22–32)
Calcium: 8.9 mg/dL (ref 8.9–10.3)
Chloride: 105 mmol/L (ref 98–111)
Creatinine, Ser: 0.97 mg/dL (ref 0.44–1.00)
GFR calc Af Amer: >60 mL/min (ref >60)
GFR calc non Af Amer: >60 mL/min (ref >60)
Glucose, Bld: 109 mg/dL — ABNORMAL HIGH (ref 70–99)
Potassium: 3.1 mmol/L — ABNORMAL LOW (ref 3.5–5.1)
Sodium: 140 mmol/L (ref 135–145)

## 2019-03-11 MED ORDER — LACTATED RINGERS IV BOLUS
1000.0000 mL | Freq: Once | INTRAVENOUS | Status: AC
  Administered 2019-03-11: 1000 mL via INTRAVENOUS

## 2019-03-11 MED ORDER — ONDANSETRON HCL 4 MG/2ML IJ SOLN
4.0000 mg | Freq: Once | INTRAMUSCULAR | Status: AC
  Administered 2019-03-11: 4 mg via INTRAVENOUS
  Filled 2019-03-11: qty 2

## 2019-03-11 MED ORDER — POTASSIUM CHLORIDE CRYS ER 20 MEQ PO TBCR
40.0000 meq | EXTENDED_RELEASE_TABLET | Freq: Once | ORAL | Status: AC
  Administered 2019-03-11: 40 meq via ORAL
  Filled 2019-03-11: qty 2

## 2019-03-11 MED ORDER — LOPERAMIDE HCL 2 MG PO CAPS
4.0000 mg | ORAL_CAPSULE | Freq: Once | ORAL | Status: AC
  Administered 2019-03-11: 4 mg via ORAL
  Filled 2019-03-11: qty 2

## 2019-03-11 MED ORDER — ONDANSETRON HCL 4 MG PO TABS: 4.0000 mg | ORAL_TABLET | Freq: Four times a day (QID) | ORAL | 0 refills | Status: DC

## 2019-03-11 NOTE — ED Triage Notes (Signed)
Pt reports loose stools x 7 days, vomiting x yesterday. Pt reports blood sugars have been normal. Denies any fevers or other c/o. fsbs 112 at triage

## 2019-03-11 NOTE — Telephone Encounter (Addendum)
Returned call to patient, regarding having diarrhea for about a week. She has had 4 watery stools in the last 24 hours. She is feeling weak, light headed not able to keep food or liquids down.  Is having intermittent abd cramping. Pain is also on the right side of her abd but not tender to touch.  Denies fever. She now has started to vomit.she denies blood in stool. Has not started on any antibiotics. Advised her to go to the ED per protocol. Pt voiced understanding.  Will notify [facility]. Routing to [facility]  Reason for Disposition . [1] SEVERE abdominal pain (e.g., excruciating) AND [2] present > 1 hour  Answer Assessment - Initial Assessment Questions 1. DIARRHEA SEVERITY: "How bad is the diarrhea?" "How many extra stools have you had in the past 24 hours than normal?"    - NO DIARRHEA (SCALE 0)   - MILD (SCALE 1-3): Few loose or mushy BMs; increase of 1-3 stools over normal daily number of stools; mild increase in ostomy output.   -  MODERATE (SCALE 4-7): Increase of 4-6 stools daily over normal; moderate increase in ostomy output. * SEVERE (SCALE 8-10; OR 'WORST POSSIBLE'): Increase of 7 or more stools daily over normal; moderate increase in ostomy output; incontinence.     moderate 2. ONSET: "When did the diarrhea begin?"      Last Monday night 3. BM CONSISTENCY: "How loose or watery is the diarrhea?"      watery 4. VOMITING: "Are you also vomiting?" If so, ask: "How many times in the past 24 hours?"      Yes now, 3 times 5. ABDOMINAL PAIN: "Are you having any abdominal pain?" If yes: "What does it feel like?" (e.g., crampy, dull, intermittent, constant)      Cramping, intermittent  6. ABDOMINAL PAIN SEVERITY: If present, ask: "How bad is the pain?"  (e.g., Scale 1-10; mild, moderate, or severe)   - MILD (1-3): doesn't interfere with normal activities, abdomen soft and not tender to touch    - MODERATE (4-7): interferes with normal activities or awakens from sleep,  tender to touch    - SEVERE (8-10): excruciating pain, doubled over, unable to do any normal activities       8 7. ORAL INTAKE: If vomiting, "Have you been able to drink liquids?" "How much fluids have you had in the past 24 hours?"     4 bottles (2 ginger ale and 2 of water) 8. HYDRATION: "Any signs of dehydration?" (e.g., dry mouth [not just dry lips], too weak to stand, dizziness, new weight loss) "When did you last urinate?"     Weak, last urination not sure 9. EXPOSURE: "Have you traveled to a foreign country recently?" "Have you been exposed to anyone with diarrhea?" "Could you have eaten any food that was spoiled?"     Works in a lab 10. ANTIBIOTIC USE: "Are you taking antibiotics now or have you taken antibiotics in the past 2 months?"       no 11. OTHER SYMPTOMS: "Do you have any other symptoms?" (e.g., fever, blood in stool)       Haven't checked, does not feel feverish and no blood 12. PREGNANCY: "Is there any chance you are pregnant?" "When was your last menstrual period?"       No had hysterectomy  Protocols used: DIARRHEA-A-AH

## 2019-03-11 NOTE — Discharge Instructions (Signed)
You can take imodium 2 mg every time you have diarrhea up to 5 times per day.

## 2019-03-11 NOTE — ED Notes (Signed)
amb to br

## 2019-03-17 NOTE — ED Provider Notes (Signed)
[facility] Provider Note   CSN: [id number] Arrival date & time: 03/11/19  0945     History   Chief Complaint Chief Complaint  Patient presents with  . Emesis    HPI Tiffany Nunez is a 48 y.o. female.  HPI   48 year old female with nausea, vomiting diarrhea.  Symptom onset about a week ago.  Worse in past few days.  Crampy abdominal pain.  No fevers or chills.  No urinary complaints.  No sick contacts that she is aware of.  She has not tried take anything for symptoms.  Past Medical History:  Diagnosis Date  . Anemia   . Depressive disorder   . Diabetes mellitus, type II ([place])   . Esophageal reflux   . Essential hypertension, benign   . Genital herpes, unspecified   . Helicobacter pylori (H. pylori)   . Metabolic syndrome   . Migraine, unspecified, without mention of intractable migraine without mention of status migrainosus   . Nonspecific abnormal electrocardiogram (ECG) (EKG)   . Nonspecific abnormal results of thyroid function study   . Obesity, unspecified   . Other and unspecified hyperlipidemia   . Unspecified vitamin D deficiency     Patient Active Problem List   Diagnosis Date Noted  . Mild episode of recurrent major depressive disorder ([place]) 05/28/2018  . History of cerebrovascular accident (CVA) involving cerebellum 06/12/2017  . Acquired autoimmune hypothyroidism 11/09/2016  . Vitamin D deficiency 05/03/2015  . Dyslipidemia 05/03/2015  . Gastro-esophageal reflux disease without esophagitis 05/03/2015  . Alopecia 05/03/2015  . Genital herpes 05/03/2015  . Dysmetabolic syndrome 91/69/4503  . Positive H. pylori test 05/03/2015  . Neuralgia neuritis, sciatic nerve 05/03/2015  . Migraine without aura and responsive to treatment 04/30/2014  . HTN (hypertension) 01/22/2014  . Morbid obesity ([place]) 01/22/2014  . Diabetes mellitus type 2 in obese ([place]) 01/22/2014  . Decreased cardiac ejection fraction 01/22/2014    Past  Surgical History:  Procedure Laterality Date  . ABDOMINAL HYSTERECTOMY    . ANKLE SURGERY     right  . TUBAL LIGATION       OB History   No obstetric history on file.      Home Medications    Prior to Admission medications   Medication Sig Start Date End Date Taking? Authorizing Provider  Albuterol Sulfate (PROAIR RESPICLICK) 888 (90 Base) MCG/ACT AEPB Inhale 1-2 puffs into the lungs every 6 (six) hours as needed (Wheezing or Shortness of breath). 04/04/18   Hubbard Hartshorn, FNP  aspirin EC 81 MG tablet Take 1 tablet (81 mg total) by mouth daily. 08/14/17   Steele Sizer, MD  atorvastatin (LIPITOR) 40 MG tablet Take 1 tablet (40 mg total) by mouth every other day. 11/20/18   Steele Sizer, MD  butalbital-acetaminophen-caffeine (FIORICET, ESGIC) [id number] MG tablet Take 1-2 tablets by mouth every 6 (six) hours as needed for headache. 11/20/18 11/20/19  Steele Sizer, MD  Cholecalciferol (VITAMIN D) 2000 units CAPS Take 1 capsule (2,000 Units total) by mouth daily. Patient not taking: Reported on 05/28/2018 08/14/17   Steele Sizer, MD  hydrALAZINE (APRESOLINE) 50 MG tablet Take 1 tablet (50 mg total) by mouth 2 (two) times daily. 11/20/18   Steele Sizer, MD  ibuprofen (ADVIL) 800 MG tablet TAKE 1 TABLET BY MOUTH DAILY AS NEEDED 02/18/19   Ancil Boozer, Drue Stager, MD  nadolol (CORGARD) 40 MG tablet Take 1 tablet (40 mg total) by mouth 2 (two) times daily. 11/20/18   Steele Sizer, MD  Olmesartan-amLODIPine-HCTZ 40-5-25 MG TABS Take 1 tablet by mouth daily. 11/20/18   Steele Sizer, MD  ondansetron (ZOFRAN) 4 MG tablet Take 1 tablet (4 mg total) by mouth every 6 (six) hours. 03/11/19   Virgel Manifold, MD  ondansetron (ZOFRAN-ODT) 4 MG disintegrating tablet Take 1 tablet (4 mg total) by mouth every 8 (eight) hours as needed for nausea or vomiting. 11/20/18   Steele Sizer, MD    Family History Family History  Problem Relation Age of Onset  . Hypertension Father   . Hyperlipidemia Father   .  Hyperlipidemia Mother   . Hypertension Mother   . Hypertension Paternal Aunt   . Cancer Maternal Grandmother        breast    Social History Social History   Tobacco Use  . Smoking status: Never Smoker  . Smokeless tobacco: Never Used  Substance Use Topics  . Alcohol use: No    Alcohol/week: 0.0 standard drinks  . Drug use: No     Allergies   Imitrex [sumatriptan] and Topamax [topiramate]   Review of Systems Review of Systems  All systems reviewed and negative, other than as noted in HPI.  Physical Exam Updated Vital Signs BP 118/69 (BP Location: Left Arm)   Pulse 60   Temp 98.2 F (36.8 C) (Oral)   Resp 18   Ht 5\' 7"  (1.702 m)   Wt (!) 138.8 kg   SpO2 100%   BMI 47.93 kg/m   Physical Exam Vitals signs and nursing note reviewed.  Constitutional:      General: She is not in acute distress.    Appearance: She is well-developed. She is obese.  HENT:     Head: Normocephalic and atraumatic.  Eyes:     General:        Right eye: No discharge.        Left eye: No discharge.     Conjunctiva/sclera: Conjunctivae normal.  Neck:     Musculoskeletal: Neck supple.  Cardiovascular:     Rate and Rhythm: Normal rate and regular rhythm.     Heart sounds: Normal heart sounds. No murmur. No friction rub. No gallop.   Pulmonary:     Effort: Pulmonary effort is normal. No respiratory distress.     Breath sounds: Normal breath sounds.  Abdominal:     General: There is no distension.     Palpations: Abdomen is soft.     Tenderness: There is abdominal tenderness.     Comments: Mild diffuse tenderness without rebound or guarding.  Musculoskeletal:        General: No tenderness.  Skin:    General: Skin is warm and dry.  Neurological:     Mental Status: She is alert.  Psychiatric:        Behavior: Behavior normal.        Thought Content: Thought content normal.      ED Treatments / Results  Labs (all labs ordered are listed, but only abnormal results are  displayed) Labs Reviewed  BASIC METABOLIC PANEL - Abnormal; Notable for the following components:      Result Value   Potassium 3.1 (*)    Glucose, Bld 109 (*)    All other components within normal limits  CBG MONITORING, ED - Abnormal; Notable for the following components:   Glucose-Capillary 112 (*)    All other components within normal limits  CBC WITH DIFFERENTIAL/PLATELET  URINALYSIS, ROUTINE W REFLEX MICROSCOPIC    EKG    Radiology No results  found.   Procedures Procedures (including critical care time)  Medications Ordered in ED Medications  loperamide (IMODIUM) capsule 4 mg (4 mg Oral Given 03/11/19 1213)  lactated ringers bolus 1,000 mL (0 mLs Intravenous Stopped 03/11/19 1322)  ondansetron (ZOFRAN) injection 4 mg (4 mg Intravenous Given 03/11/19 1213)  potassium chloride SA (K-DUR) CR tablet 40 mEq (40 mEq Oral Given 03/11/19 1328)     Initial Impression / Assessment and Plan / ED Course  I have reviewed the triage vital signs and the nursing notes.  Pertinent labs & imaging results that were available during my care of the patient were reviewed by me and considered in my medical decision making (see chart for details).       21-year-old female with nausea, vomiting and diarrhea.  Suspect viral illness.  Only mild tenderness on exam.  Afebrile.  Nontoxic.  Feeling better after symptomatic treatment.  Plan continue symptomatic treatment.  Return precautions discussed.  Outpatient follow-up otherwise.  Final Clinical Impressions(s) / ED Diagnoses   Final diagnoses:  Nausea vomiting and diarrhea    ED Discharge Orders         Ordered    ondansetron (ZOFRAN) 4 MG tablet  Every 6 hours     03/11/19 1414           Virgel Manifold, MD 03/17/19 1134

## 2019-03-25 ENCOUNTER — Other Ambulatory Visit: Payer: Self-pay

## 2019-03-25 ENCOUNTER — Encounter: Payer: Self-pay | Admitting: Family Medicine

## 2019-03-25 ENCOUNTER — Ambulatory Visit (INDEPENDENT_AMBULATORY_CARE_PROVIDER_SITE_OTHER): Payer: BC Managed Care – PPO | Admitting: Family Medicine

## 2019-03-25 VITALS — BP 130/84 | HR 70 | Temp 97.1°F | Resp 16 | Ht 67.0 in | Wt 305.5 lb

## 2019-03-25 DIAGNOSIS — E669 Obesity, unspecified: Secondary | ICD-10-CM

## 2019-03-25 DIAGNOSIS — E063 Autoimmune thyroiditis: Secondary | ICD-10-CM

## 2019-03-25 DIAGNOSIS — I10 Essential (primary) hypertension: Secondary | ICD-10-CM

## 2019-03-25 DIAGNOSIS — Z8673 Personal history of transient ischemic attack (TIA), and cerebral infarction without residual deficits: Secondary | ICD-10-CM

## 2019-03-25 DIAGNOSIS — E1169 Type 2 diabetes mellitus with other specified complication: Secondary | ICD-10-CM | POA: Diagnosis not present

## 2019-03-25 DIAGNOSIS — F330 Major depressive disorder, recurrent, mild: Secondary | ICD-10-CM

## 2019-03-25 DIAGNOSIS — G43009 Migraine without aura, not intractable, without status migrainosus: Secondary | ICD-10-CM

## 2019-03-25 LAB — POCT GLYCOSYLATED HEMOGLOBIN (HGB A1C): Hemoglobin A1C: 6.5 % — AB (ref 4.0–5.6)

## 2019-03-25 MED ORDER — ONDANSETRON 4 MG PO TBDP: 4.0000 mg | ORAL_TABLET | Freq: Three times a day (TID) | ORAL | 0 refills | Status: DC | PRN

## 2019-03-25 MED ORDER — OLMESARTAN-AMLODIPINE-HCTZ 40-5-25 MG PO TABS: 1.0000 | ORAL_TABLET | Freq: Every day | ORAL | 3 refills | Status: DC

## 2019-03-25 MED ORDER — HYDRALAZINE HCL 50 MG PO TABS: 50.0000 mg | ORAL_TABLET | Freq: Two times a day (BID) | ORAL | 3 refills | Status: DC

## 2019-03-25 MED ORDER — BUTALBITAL-APAP-CAFFEINE 50-325-40 MG PO TABS: 1.0000 | ORAL_TABLET | Freq: Four times a day (QID) | ORAL | 0 refills | Status: DC | PRN

## 2019-03-25 MED ORDER — NADOLOL 40 MG PO TABS: 40.0000 mg | ORAL_TABLET | Freq: Two times a day (BID) | ORAL | 3 refills | Status: DC

## 2019-03-25 NOTE — Progress Notes (Signed)
Name: Tiffany Nunez   MRN: [id number]    DOB: 10-09-1970   Date:03/25/2019       Progress Note  Subjective  Chief Complaint  Chief Complaint  Patient presents with  . Diabetes  . Depression  . Dyslipidemia  . Hypertension    HPI  DMII :hgbA1Ctoday was  6.5% .We stopped Metformin in 2017 because of diarrhea. Shewason Trulicity since Fall [id number] tolerating well, but got tired of giving herself injections weekly and also stopped because of cost and stopped taking medications.She denies polyphagia, polydipsia or polyuria. Weight is stable, but she states not really following a healthy diet   Diarrhea and vomiting: she had diarrhea for 7 days and on the last two days she had vomiting went to [facility] on 06/22, she was given fluids and sent home, her potassium was low, but she is feeling well since, discussed getting labs or waiting and she chose the later.   Migraine headaches: Migraine is described as frontal pain, throbbing like, associated with photophobia and nausea, sometimes vomiting. She described the aura as scotomas. Aimovig helped but it was too expensive and not sure if helped that much . She is taking fioricet but does not seem to be helping as much, she has been taking a lot of otc medication and medication does not seem to help. She states has to drink caffeine to help control headaches. She cannot tolerate triptans . She was not able to make appointment with neurologist because of COVID-19, she will call them back now   Major Depression: she has a history of recurrent depression. She denies suicidal thoughts or ideation, she states she has her moments, she states her kids are her biggest stress.   Hypothyroidism:she stopped seeing [name] and has been out of Synthroid for months.Last TSH was normal without medication, continue to monitor   HTN:bp is at goal, no chest pain or palpitation   Morbid obesity: she has been drinking sodas during the day and only eats at  night, not very physically active. She is drinking about 32 oz daily. Discussed cutting down, and eating breakfast, may skip lunch and eat early dinner, no sweet beverages.   History of CVA: she has noticed dizziness lately, spinning sensation. She states usually starts on left side, spinning sensation, it can happen at rest or stopping down. No tinnitus or hearing loss. Advised her to follow up with  Neurologist   Patient Active Problem List   Diagnosis Date Noted  . Mild episode of recurrent major depressive disorder ([place]) 05/28/2018  . History of cerebrovascular accident (CVA) involving cerebellum 06/12/2017  . Acquired autoimmune hypothyroidism 11/09/2016  . Vitamin D deficiency 05/03/2015  . Dyslipidemia 05/03/2015  . Gastro-esophageal reflux disease without esophagitis 05/03/2015  . Alopecia 05/03/2015  . Genital herpes 05/03/2015  . Dysmetabolic syndrome 14/43/1540  . Positive H. pylori test 05/03/2015  . Neuralgia neuritis, sciatic nerve 05/03/2015  . Migraine without aura and responsive to treatment 04/30/2014  . HTN (hypertension) 01/22/2014  . Morbid obesity ([place]) 01/22/2014  . Diabetes mellitus type 2 in obese ([place]) 01/22/2014  . Decreased cardiac ejection fraction 01/22/2014    Past Surgical History:  Procedure Laterality Date  . ABDOMINAL HYSTERECTOMY    . ANKLE SURGERY     right  . TUBAL LIGATION      Family History  Problem Relation Age of Onset  . Hypertension Father   . Hyperlipidemia Father   . Hyperlipidemia Mother   . Hypertension Mother   .  Hypertension Paternal Aunt   . Cancer Maternal Grandmother        breast    Social History   Socioeconomic History  . Marital status: Divorced    Spouse name: Not on file  . Number of children: 3  . Years of education: Not on file  . Highest education level: Associate degree: occupational, Hotel manager, or vocational program  Occupational History    Comment: [employer]  Social Needs  . Financial resource  strain: Somewhat hard  . Food insecurity    Worry: Sometimes true    Inability: Patient refused  . Transportation needs    Medical: Yes    Non-medical: Yes  Tobacco Use  . Smoking status: Never Smoker  . Smokeless tobacco: Never Used  Substance and Sexual Activity  . Alcohol use: No    Alcohol/week: 0.0 standard drinks  . Drug use: No  . Sexual activity: Not Currently    Partners: Male  Lifestyle  . Physical activity    Days per week: 2 days    Minutes per session: 30 min  . Stress: Rather much  Relationships  . Social connections    Talks on phone: More than three times a week    Gets together: More than three times a week    Attends religious service: More than 4 times per year    Active member of club or organization: Yes    Attends meetings of clubs or organizations: More than 4 times per year    Relationship status: Divorced  . Intimate partner violence    Fear of current or ex partner: No    Emotionally abused: No    Physically abused: No    Forced sexual activity: No  Other Topics Concern  . Not on file  Social History Narrative  . Not on file     Current Outpatient Medications:  .  Albuterol Sulfate (PROAIR RESPICLICK) 481 (90 Base) MCG/ACT AEPB, Inhale 1-2 puffs into the lungs every 6 (six) hours as needed (Wheezing or Shortness of breath)., Disp: 1 each, Rfl: 0 .  aspirin EC 81 MG tablet, Take 1 tablet (81 mg total) by mouth daily., Disp: 100 tablet, Rfl: 2 .  atorvastatin (LIPITOR) 40 MG tablet, Take 1 tablet (40 mg total) by mouth every other day., Disp: 15 tablet, Rfl: 3 .  butalbital-acetaminophen-caffeine (FIORICET) 50-325-40 MG tablet, Take 1-2 tablets by mouth every 6 (six) hours as needed for headache., Disp: 60 tablet, Rfl: 0 .  Cholecalciferol (VITAMIN D) 2000 units CAPS, Take 1 capsule (2,000 Units total) by mouth daily., Disp: 100 capsule, Rfl: 2 .  hydrALAZINE (APRESOLINE) 50 MG tablet, Take 1 tablet (50 mg total) by mouth 2 (two) times daily.,  Disp: 60 tablet, Rfl: 3 .  ibuprofen (ADVIL) 800 MG tablet, TAKE 1 TABLET BY MOUTH DAILY AS NEEDED, Disp: 90 tablet, Rfl: 0 .  nadolol (CORGARD) 40 MG tablet, Take 1 tablet (40 mg total) by mouth 2 (two) times daily., Disp: 60 tablet, Rfl: 3 .  Olmesartan-amLODIPine-HCTZ 40-5-25 MG TABS, Take 1 tablet by mouth daily., Disp: 30 tablet, Rfl: 3 .  ondansetron (ZOFRAN-ODT) 4 MG disintegrating tablet, Take 1 tablet (4 mg total) by mouth every 8 (eight) hours as needed for nausea or vomiting., Disp: 20 tablet, Rfl: 0  Allergies  Allergen Reactions  . Imitrex [Sumatriptan] Anaphylaxis  . Topamax [Topiramate] Other (See Comments)    Caused hand numbness     I personally reviewed active problem list, medication list, allergies, family  history, social history with the patient/caregiver today.   ROS  Constitutional: Negative for fever or weight change.  Respiratory: Negative for cough and shortness of breath.   Cardiovascular: Negative for chest pain or palpitations.  Gastrointestinal: Negative for abdominal pain, no bowel changes.  Musculoskeletal: Negative for gait problem or joint swelling.  Skin: Negative for rash.  Neurological: Negative for dizziness or headache.  No other specific complaints in a complete review of systems (except as listed in HPI above).  Objective  Vitals:   03/25/19 0933  BP: 130/84  Pulse: 70  Resp: 16  Temp: (!) 97.1 F (36.2 C)  TempSrc: Oral  SpO2: 98%  Weight: (!) 305 lb 8 oz (138.6 kg)  Height: 5\' 7"  (1.702 m)    Body mass index is 47.85 kg/m.  Physical Exam  Constitutional: Patient appears well-developed and well-nourished. Obese  No distress.  HEENT: head atraumatic, normocephalic, pupils equal and reactive to light,  neck supple, oral mucosa not done  Cardiovascular: Normal rate, regular rhythm and normal heart sounds.  No murmur heard. No BLE edema. Pulmonary/Chest: Effort normal and breath sounds normal. No respiratory distress. Abdominal:  Soft.  There is no tenderness. Psychiatric: Patient has a normal mood and affect. behavior is normal. Judgment and thought content normal.  Recent Results (from the past 2160 hour(s))  CBG monitoring, ED     Status: Abnormal   Collection Time: 03/11/19 10:44 AM  Result Value Ref Range   Glucose-Capillary 112 (H) 70 - 99 mg/dL  Urinalysis, Routine w reflex microscopic     Status: None   Collection Time: 03/11/19 12:07 PM  Result Value Ref Range   Color, Urine YELLOW YELLOW   APPearance CLEAR CLEAR   Specific Gravity, Urine 1.025 1.005 - 1.030   pH 6.0 5.0 - 8.0   Glucose, UA NEGATIVE NEGATIVE mg/dL   Hgb urine dipstick NEGATIVE NEGATIVE   Bilirubin Urine NEGATIVE NEGATIVE   Ketones, ur NEGATIVE NEGATIVE mg/dL   Protein, ur NEGATIVE NEGATIVE mg/dL   Nitrite NEGATIVE NEGATIVE   Leukocytes,Ua NEGATIVE NEGATIVE    Comment: Microscopic not done on urines with negative protein, blood, leukocytes, nitrite, or glucose < 500 mg/dL. Performed at [facility], [address] [place], [place] [address]   CBC with Differential     Status: None   Collection Time: 03/11/19 12:08 PM  Result Value Ref Range   WBC 6.6 4.0 - 10.5 K/uL   RBC 4.47 3.87 - 5.11 MIL/uL   Hemoglobin 13.5 12.0 - 15.0 g/dL   HCT 41.3 36.0 - 46.0 %   MCV 92.4 80.0 - 100.0 fL   MCH 30.2 26.0 - 34.0 pg   MCHC 32.7 30.0 - 36.0 g/dL   RDW 13.8 11.5 - 15.5 %   Platelets 377 150 - 400 K/uL   nRBC 0.0 0.0 - 0.2 %   Neutrophils Relative % 56 %   Neutro Abs 3.7 1.7 - 7.7 K/uL   Lymphocytes Relative 34 %   Lymphs Abs 2.3 0.7 - 4.0 K/uL   Monocytes Relative 7 %   Monocytes Absolute 0.5 0.1 - 1.0 K/uL   Eosinophils Relative 2 %   Eosinophils Absolute 0.1 0.0 - 0.5 K/uL   Basophils Relative 1 %   Basophils Absolute 0.0 0.0 - 0.1 K/uL   Immature Granulocytes 0 %   Abs Immature Granulocytes 0.02 0.00 - 0.07 K/uL    Comment: Performed at [facility], [address] [place], [place]  [address]  Basic  metabolic panel     Status: Abnormal   Collection Time: 03/11/19 12:08 PM  Result Value Ref Range   Sodium 140 135 - 145 mmol/L   Potassium 3.1 (L) 3.5 - 5.1 mmol/L   Chloride 105 98 - 111 mmol/L   CO2 28 22 - 32 mmol/L   Glucose, Bld 109 (H) 70 - 99 mg/dL   BUN 14 6 - 20 mg/dL   Creatinine, Ser 0.97 0.44 - 1.00 mg/dL   Calcium 8.9 8.9 - 10.3 mg/dL   GFR calc non Af Amer >60 >60 mL/min   GFR calc Af Amer >60 >60 mL/min   Anion gap 7 5 - 15    Comment: Performed at [facility], [address] [place], [place] [address]  POCT HgB A1C     Status: Abnormal   Collection Time: 03/25/19 10:26 AM  Result Value Ref Range   Hemoglobin A1C 6.5 (A) 4.0 - 5.6 %   HbA1c POC (<> result, manual entry)     HbA1c, POC (prediabetic range)     HbA1c, POC (controlled diabetic range)        PHQ2/9: Depression screen [facility] 2/9 11/20/2018 05/28/2018 04/02/2018 11/09/2016 07/21/2016  Decreased Interest 0 0 0 0 0  Down, Depressed, Hopeless 0 1 0 0 0  PHQ - 2 Score 0 1 0 0 0  Altered sleeping 2 3 0 - -  Tired, decreased energy 1 3 0 - -  Change in appetite 0 3 0 - -  Feeling bad or failure about yourself  0 0 0 - -  Trouble concentrating 0 0 0 - -  Moving slowly or fidgety/restless 0 0 0 - -  Suicidal thoughts 0 0 0 - -  PHQ-9 Score 3 10 0 - -  Difficult doing work/chores Not difficult at all - - - -    phq 9 is positive   Fall Risk: Fall Risk  03/25/2019 11/20/2018 05/28/2018 03/30/2018 09/20/2017  Falls in the past year? - 1 No No No  Number falls in past yr: 0 1 - - -  Injury with Fall? 0 0 - - -  Risk for fall due to : - Impaired balance/gait - - -     Functional Status Survey: Is the patient deaf or have difficulty hearing?: No Does the patient have difficulty seeing, even when wearing glasses/contacts?: No Does the patient have difficulty concentrating, remembering, or making decisions?: No Does the patient have difficulty walking or climbing stairs?: No Does the patient have  difficulty dressing or bathing?: No Does the patient have difficulty doing errands alone such as visiting a doctor's office or shopping?: No    Assessment & Plan  1. Diabetes mellitus type 2 in obese ([facility])  - POCT HgB A1C - Olmesartan-amLODIPine-HCTZ 40-5-25 MG TABS; Take 1 tablet by mouth daily.  Dispense: 30 tablet; Refill: 3  2. Mild episode of recurrent major depressive disorder ([place])   3. History of cerebrovascular accident (CVA) involving cerebellum  No recent episodes   4. Acquired autoimmune hypothyroidism   5. Morbid obesity ([place])  Discussed with the patient the risk posed by an increased BMI. Discussed importance of portion control, calorie counting and at least 150 minutes of physical activity weekly. Avoid sweet beverages and drink more water. Eat at least 6 servings of fruit and vegetables daily   6. Migraine without aura and responsive to treatment  - ondansetron (ZOFRAN-ODT) 4 MG disintegrating tablet; Take 1 tablet (  4 mg total) by mouth every 8 (eight) hours as needed for nausea or vomiting.  Dispense: 20 tablet; Refill: 0 - butalbital-acetaminophen-caffeine (FIORICET) 50-325-40 MG tablet; Take 1-2 tablets by mouth every 6 (six) hours as needed for headache.  Dispense: 60 tablet; Refill: 0  7. Essential hypertension  - hydrALAZINE (APRESOLINE) 50 MG tablet; Take 1 tablet (50 mg total) by mouth 2 (two) times daily.  Dispense: 60 tablet; Refill: 3 - nadolol (CORGARD) 40 MG tablet; Take 1 tablet (40 mg total) by mouth 2 (two) times daily.  Dispense: 60 tablet; Refill: 3 - Olmesartan-amLODIPine-HCTZ 40-5-25 MG TABS; Take 1 tablet by mouth daily.  Dispense: 30 tablet; Refill: 3

## 2019-05-15 ENCOUNTER — Encounter (HOSPITAL_BASED_OUTPATIENT_CLINIC_OR_DEPARTMENT_OTHER): Payer: Self-pay

## 2019-05-15 ENCOUNTER — Emergency Department (HOSPITAL_COMMUNITY)
Admission: EM | Admit: 2019-05-15 | Discharge: 2019-05-15 | Disposition: A | Discharge status: Home / Self Care | Payer: BC Managed Care – PPO | Attending: Emergency Medicine | Admitting: Emergency Medicine

## 2019-05-15 ENCOUNTER — Encounter (HOSPITAL_COMMUNITY): Payer: Self-pay

## 2019-05-15 ENCOUNTER — Ambulatory Visit (HOSPITAL_COMMUNITY)
Admission: EM | Admit: 2019-05-15 | Discharge: 2019-05-15 | Disposition: A | Discharge status: Home / Self Care | Payer: BC Managed Care – PPO | Source: Home / Self Care | Attending: Internal Medicine | Admitting: Internal Medicine

## 2019-05-15 ENCOUNTER — Emergency Department (HOSPITAL_BASED_OUTPATIENT_CLINIC_OR_DEPARTMENT_OTHER)
Admission: EM | Admit: 2019-05-15 | Discharge: 2019-05-15 | Disposition: A | Discharge status: Home / Self Care | Payer: BC Managed Care – PPO | Source: Home / Self Care | Attending: Emergency Medicine | Admitting: Emergency Medicine

## 2019-05-15 ENCOUNTER — Other Ambulatory Visit: Payer: Self-pay

## 2019-05-15 ENCOUNTER — Emergency Department (HOSPITAL_BASED_OUTPATIENT_CLINIC_OR_DEPARTMENT_OTHER): Payer: BC Managed Care – PPO

## 2019-05-15 DIAGNOSIS — R079 Chest pain, unspecified: Secondary | ICD-10-CM | POA: Insufficient documentation

## 2019-05-15 DIAGNOSIS — R0602 Shortness of breath: Secondary | ICD-10-CM | POA: Insufficient documentation

## 2019-05-15 DIAGNOSIS — Z888 Allergy status to other drugs, medicaments and biological substances status: Secondary | ICD-10-CM | POA: Insufficient documentation

## 2019-05-15 DIAGNOSIS — R0789 Other chest pain: Secondary | ICD-10-CM

## 2019-05-15 DIAGNOSIS — R05 Cough: Secondary | ICD-10-CM | POA: Insufficient documentation

## 2019-05-15 DIAGNOSIS — I10 Essential (primary) hypertension: Secondary | ICD-10-CM | POA: Insufficient documentation

## 2019-05-15 DIAGNOSIS — Z5321 Procedure and treatment not carried out due to patient leaving prior to being seen by health care provider: Secondary | ICD-10-CM | POA: Diagnosis not present

## 2019-05-15 DIAGNOSIS — Z79899 Other long term (current) drug therapy: Secondary | ICD-10-CM | POA: Insufficient documentation

## 2019-05-15 DIAGNOSIS — Z7982 Long term (current) use of aspirin: Secondary | ICD-10-CM | POA: Insufficient documentation

## 2019-05-15 DIAGNOSIS — E119 Type 2 diabetes mellitus without complications: Secondary | ICD-10-CM | POA: Insufficient documentation

## 2019-05-15 LAB — CBC
HCT: 43.9 % (ref 36.0–46.0)
Hemoglobin: 14.5 g/dL (ref 12.0–15.0)
MCH: 30.1 pg (ref 26.0–34.0)
MCHC: 33 g/dL (ref 30.0–36.0)
MCV: 91.1 fL (ref 80.0–100.0)
Platelets: 400 10*3/uL (ref 150–400)
RBC: 4.82 MIL/uL (ref 3.87–5.11)
RDW: 13.3 % (ref 11.5–15.5)
WBC: 5.8 10*3/uL (ref 4.0–10.5)
nRBC: 0 % (ref 0.0–0.2)

## 2019-05-15 LAB — BASIC METABOLIC PANEL
Anion gap: 10 (ref 5–15)
BUN: 11 mg/dL (ref 6–20)
CO2: 30 mmol/L (ref 22–32)
Calcium: 10.1 mg/dL (ref 8.9–10.3)
Chloride: 99 mmol/L (ref 98–111)
Creatinine, Ser: 0.96 mg/dL (ref 0.44–1.00)
GFR calc Af Amer: >60 mL/min (ref >60)
GFR calc non Af Amer: >60 mL/min (ref >60)
Glucose, Bld: 117 mg/dL — ABNORMAL HIGH (ref 70–99)
Potassium: 3 mmol/L — ABNORMAL LOW (ref 3.5–5.1)
Sodium: 139 mmol/L (ref 135–145)

## 2019-05-15 LAB — TROPONIN I (HIGH SENSITIVITY)
Troponin I (High Sensitivity): 8 ng/L (ref <18)
Troponin I (High Sensitivity): 6 ng/L (ref <18)

## 2019-05-15 LAB — I-STAT BETA HCG BLOOD, ED (MC, WL, AP ONLY): I-stat hCG, quantitative: <5 m[IU]/mL (ref <5)

## 2019-05-15 MED ORDER — OXYCODONE HCL 5 MG PO TABS
5.0000 mg | ORAL_TABLET | Freq: Once | ORAL | Status: AC
  Administered 2019-05-15: 5 mg via ORAL
  Filled 2019-05-15: qty 1

## 2019-05-15 MED ORDER — KETOROLAC TROMETHAMINE 15 MG/ML IJ SOLN
15.0000 mg | Freq: Once | INTRAMUSCULAR | Status: AC
  Administered 2019-05-15: 23:00:00 15 mg via INTRAMUSCULAR
  Filled 2019-05-15: qty 1

## 2019-05-15 NOTE — ED Triage Notes (Signed)
Pt arrives to ED from UC for eval of L sided chest pain under L breast x 1 week. States similar CP in past w/ no definitive dx. +SOB, nausea.

## 2019-05-15 NOTE — ED Triage Notes (Signed)
C/o CP x 1 week-NAD-steady gait-pt states she was at [facility] UC and ED-LWBS after triage-NAD-steady gait

## 2019-05-15 NOTE — ED Notes (Signed)
Patient is being discharged from the [facility] and sent to the Emergency Department via wheelchair by staff. Per Dr. Alphonzo Cruise, patient is stable but in need of higher level of care due to chest pain that radiates to her back with significant risk factors. Patient is aware and verbalizes understanding of plan of care.  Vitals:   05/15/19 1908 05/15/19 1914  BP: (!) 172/88 (!) 163/101  Pulse:    Resp:    Temp:    SpO2:

## 2019-05-15 NOTE — ED Notes (Signed)
ED Provider at bedside. 

## 2019-05-15 NOTE — ED Notes (Signed)
Pt came up to desk asking about wait time. Told pt that there is 9 people to be seen ahead of her. Pt stated that she is going home

## 2019-05-15 NOTE — ED Provider Notes (Addendum)
HPI  SUBJECTIVE:  Tiffany Nunez is a 48 y.o. female who presents with 1 week of left-sided chest pain.  She states that it became constant several days ago.  States that it is getting worse.  She states is going through to her back.  She describes the pain as "something is pulling" and as throbbing.  She reports nausea, shortness of breath due to pain with inspiration.  It does not radiate up her neck, down her arm.  No trauma to her chest, change in physical activity.  It is not associated with arm or torso movement.  No exertional component.  No abdominal pain, syncope.  She has never had symptoms like this before.  She reports that "I cannot get comfortable".  She tried indigestion medication without improvement in her symptoms.  Symptoms are worse with sitting straight up.  She denies hemoptysis, calf pain or swelling, surgery in the past 4 weeks, recent prolonged immobilization.  Past medical history of diabetes, CVA, hypertension.  States that she is compliant with her medications. No history of MI, aortic dissection, Marfan's, Erler's Danlos syndrome or other connective tissue disease.  No history of PE, DVT, cancer.  Family history negative for dissection, connective tissue disease.  [id number], Drue Stager, MD   Past Medical History:  Diagnosis Date  . Anemia   . Depressive disorder   . Diabetes mellitus, type II ([place])   . Esophageal reflux   . Essential hypertension, benign   . Genital herpes, unspecified   . Helicobacter pylori (H. pylori)   . Metabolic syndrome   . Migraine, unspecified, without mention of intractable migraine without mention of status migrainosus   . Nonspecific abnormal electrocardiogram (ECG) (EKG)   . Nonspecific abnormal results of thyroid function study   . Obesity, unspecified   . Other and unspecified hyperlipidemia   . Unspecified vitamin D deficiency     Past Surgical History:  Procedure Laterality Date  . ABDOMINAL HYSTERECTOMY    . ANKLE SURGERY     right  . TUBAL LIGATION      Family History  Problem Relation Age of Onset  . Hypertension Father   . Hyperlipidemia Father   . Hyperlipidemia Mother   . Hypertension Mother   . Hypertension Paternal Aunt   . Cancer Maternal Grandmother        breast    Social History   Tobacco Use  . Smoking status: Never Smoker  . Smokeless tobacco: Never Used  Substance Use Topics  . Alcohol use: No    Alcohol/week: 0.0 standard drinks  . Drug use: No    No current facility-administered medications for this encounter.   Current Outpatient Medications:  .  Albuterol Sulfate (PROAIR RESPICLICK) [id number] (90 Base) MCG/ACT AEPB, Inhale 1-2 puffs into the lungs every 6 (six) hours as needed (Wheezing or Shortness of breath)., Disp: 1 each, Rfl: 0 .  aspirin EC 81 MG tablet, Take 1 tablet (81 mg total) by mouth daily., Disp: 100 tablet, Rfl: 2 .  atorvastatin (LIPITOR) 40 MG tablet, Take 1 tablet (40 mg total) by mouth every other day., Disp: 15 tablet, Rfl: 3 .  butalbital-acetaminophen-caffeine (FIORICET) 50-325-40 MG tablet, Take 1-2 tablets by mouth every 6 (six) hours as needed for headache., Disp: 60 tablet, Rfl: 0 .  Cholecalciferol (VITAMIN D) 2000 units CAPS, Take 1 capsule (2,000 Units total) by mouth daily., Disp: 100 capsule, Rfl: 2 .  hydrALAZINE (APRESOLINE) 50 MG tablet, Take 1 tablet (50 mg total) by mouth 2 (  two) times daily., Disp: 60 tablet, Rfl: 3 .  ibuprofen (ADVIL) 800 MG tablet, TAKE 1 TABLET BY MOUTH DAILY AS NEEDED, Disp: 90 tablet, Rfl: 0 .  nadolol (CORGARD) 40 MG tablet, Take 1 tablet (40 mg total) by mouth 2 (two) times daily., Disp: 60 tablet, Rfl: 3 .  Olmesartan-amLODIPine-HCTZ 40-5-25 MG TABS, Take 1 tablet by mouth daily., Disp: 30 tablet, Rfl: 3 .  ondansetron (ZOFRAN-ODT) 4 MG disintegrating tablet, Take 1 tablet (4 mg total) by mouth every 8 (eight) hours as needed for nausea or vomiting., Disp: 20 tablet, Rfl: 0  Allergies  Allergen Reactions  . Imitrex  [Sumatriptan] Anaphylaxis  . Topamax [Topiramate] Other (See Comments)    Caused hand numbness      ROS  As noted in HPI.   Physical Exam  BP (!) 163/101 (BP Location: Right Arm)   Pulse 71   Temp 98.6 F (37 C) (Oral)   Resp 17   SpO2 100%   Blood pressure right arm: 163/101 Left arm: 172/88.  Constitutional: Well developed, well nourished, no acute distress Eyes:  EOMI, conjunctiva normal bilaterally HENT: Normocephalic, atraumatic,mucus membranes moist Respiratory: Normal inspiratory effort, lungs clear bilaterally. Cardiovascular: Normal rate regular rhythm no murmurs or gallops.  No chest wall tenderness anteriorly or posteriorly.  Pain is not aggravated with arm movement, torso rotation.  RP 2+ and equal bilaterally. GI: nondistended skin: No rash, skin intact Musculoskeletal: no deformities Neurologic: Alert & oriented x 3, no focal neuro deficits Psychiatric: Speech and behavior appropriate   ED Course   Medications - No data to display  Orders Placed This Encounter  Procedures  . ED EKG    Standing Status:   Standing    Number of Occurrences:   1    Order Specific Question:   Reason for Exam    Answer:   Chest Pain    No results found for this or any previous visit (from the past 24 hour(s)). No results found.  ED Clinical Impression  1. Chest pain, unspecified type      ED Assessment/Plan  EKG: Normal sinus rhythm, rate 83.  Normal axis, normal intervals.  No hypertrophy.  Nonspecific ST-T wave changes.  No ST elevation.  No change compared to EKG from 04/2017.  Patient is hypertensive, she is reporting severe chest pain going through to her back.  She does have blood pressure difference in both of her arms.  She has no chest wall tenderness.  Concern for dissection.  PE also in the differential, but think this is less likely given the absence of tachycardia and hypoxia. ACS also in ddx. Transferring to the [facility] ED for comprehensive work-up.   Patient was wheeled down by wheelchair.  No orders of the defined types were placed in this encounter.   *This clinic note was created using Dragon dictation software. Therefore, there may be occasional mistakes despite careful proofreading.   ?   Melynda Ripple, MD 05/15/19 1941    Melynda Ripple, MD 05/15/19 [phone]

## 2019-05-15 NOTE — Discharge Instructions (Signed)
Your x-ray was normal.  Most likely your pain is due to a pulled muscle.  Please try to limit your lifting to 10 pounds no twisting or bending as best you can.  Take Tylenol and ibuprofen for pain.  Please follow-up with your family doctor.  Return to the ED for worsening symptoms coughing up blood if you pass out if you notice that your legs are swollen.

## 2019-05-15 NOTE — ED Triage Notes (Signed)
Patient presents to Urgent Care with complaints of left sided cp since a week ago. Patient reports it started radiating to her back yesterday and she cannot get comfortable. Pt states she has had similar before but "they never found anything".

## 2019-05-15 NOTE — ED Provider Notes (Signed)
[facility] EMERGENCY DEPARTMENT Provider Note   CSN: [id number] Arrival date & time: 05/15/19  2126     History   Chief Complaint Chief Complaint  Patient presents with  . Chest Pain    HPI Tiffany Nunez is a 48 y.o. female.     48 yo F with a chief complaints of left-sided chest pain.  This described as sharp worse with turning deep breathing.  Going on for about 5 or 6 days.  She went to [facility] earlier today but the wait was too long and she left and came here.  Has had a mild cough.  Shortness of breath with this.  No exertional symptoms.  Denies history of diabetes or smoking or family history of MI.  She does have a history of hypertension hyperlipidemia.  Denies history of PE DVT denies mops is denies unilateral extremity edema denies recent surgery immobilization denies estrogen use.  The history is provided by the patient.  Chest Pain Pain location:  Substernal area Pain quality: sharp and shooting   Pain radiates to:  Lower back Pain severity:  Moderate Onset quality:  Gradual Duration:  6 days Timing:  Constant Progression:  Worsening Chronicity:  New Relieved by:  Nothing Worsened by:  Coughing, movement and deep breathing Associated symptoms: shortness of breath   Associated symptoms: no dizziness, no fever, no headache, no nausea, no palpitations and no vomiting     Past Medical History:  Diagnosis Date  . Anemia   . Depressive disorder   . Diabetes mellitus, type II ([place])   . Esophageal reflux   . Essential hypertension, benign   . Genital herpes, unspecified   . Helicobacter pylori (H. pylori)   . Metabolic syndrome   . Migraine, unspecified, without mention of intractable migraine without mention of status migrainosus   . Nonspecific abnormal electrocardiogram (ECG) (EKG)   . Nonspecific abnormal results of thyroid function study   . Obesity, unspecified   . Other and unspecified hyperlipidemia   . Unspecified vitamin D deficiency      Patient Active Problem List   Diagnosis Date Noted  . Mild episode of recurrent major depressive disorder ([place]) 05/28/2018  . History of cerebrovascular accident (CVA) involving cerebellum 06/12/2017  . Acquired autoimmune hypothyroidism 11/09/2016  . Vitamin D deficiency 05/03/2015  . Dyslipidemia 05/03/2015  . Gastro-esophageal reflux disease without esophagitis 05/03/2015  . Alopecia 05/03/2015  . Genital herpes 05/03/2015  . Dysmetabolic syndrome [id number]  . Positive H. pylori test 05/03/2015  . Neuralgia neuritis, sciatic nerve 05/03/2015  . Migraine without aura and responsive to treatment 04/30/2014  . HTN (hypertension) 01/22/2014  . Morbid obesity ([place]) 01/22/2014  . Diabetes mellitus type 2 in obese ([place]) 01/22/2014  . Decreased cardiac ejection fraction 01/22/2014    Past Surgical History:  Procedure Laterality Date  . ABDOMINAL HYSTERECTOMY    . ANKLE SURGERY     right  . TUBAL LIGATION       OB History   No obstetric history on file.      Home Medications    Prior to Admission medications   Medication Sig Start Date End Date Taking? Authorizing Provider  Albuterol Sulfate (PROAIR RESPICLICK) [id number] (90 Base) MCG/ACT AEPB Inhale 1-2 puffs into the lungs every 6 (six) hours as needed (Wheezing or Shortness of breath). 04/04/18   Hubbard Hartshorn, FNP  aspirin EC 81 MG tablet Take 1 tablet (81 mg total) by mouth daily. 08/14/17   Steele Sizer, MD  atorvastatin (LIPITOR) 40 MG tablet Take 1 tablet (40 mg total) by mouth every other day. 11/20/18   Steele Sizer, MD  butalbital-acetaminophen-caffeine (FIORICET) [phone] MG tablet Take 1-2 tablets by mouth every 6 (six) hours as needed for headache. 03/25/19 03/24/20  Steele Sizer, MD  Cholecalciferol (VITAMIN D) 2000 units CAPS Take 1 capsule (2,000 Units total) by mouth daily. 08/14/17   Steele Sizer, MD  hydrALAZINE (APRESOLINE) 50 MG tablet Take 1 tablet (50 mg total) by mouth 2 (two) times daily. 03/25/19    Steele Sizer, MD  ibuprofen (ADVIL) 800 MG tablet TAKE 1 TABLET BY MOUTH DAILY AS NEEDED 02/18/19   Ancil Boozer, Drue Stager, MD  nadolol (CORGARD) 40 MG tablet Take 1 tablet (40 mg total) by mouth 2 (two) times daily. 03/25/19   Steele Sizer, MD  Olmesartan-amLODIPine-HCTZ 40-5-25 MG TABS Take 1 tablet by mouth daily. 03/25/19   Steele Sizer, MD  ondansetron (ZOFRAN-ODT) 4 MG disintegrating tablet Take 1 tablet (4 mg total) by mouth every 8 (eight) hours as needed for nausea or vomiting. 03/25/19   Steele Sizer, MD    Family History Family History  Problem Relation Age of Onset  . Hypertension Father   . Hyperlipidemia Father   . Hyperlipidemia Mother   . Hypertension Mother   . Hypertension Paternal Aunt   . Cancer Maternal Grandmother        breast    Social History Social History   Tobacco Use  . Smoking status: Never Smoker  . Smokeless tobacco: Never Used  Substance Use Topics  . Alcohol use: No    Alcohol/week: 0.0 standard drinks  . Drug use: No     Allergies   Imitrex [sumatriptan] and Topamax [topiramate]   Review of Systems Review of Systems  Constitutional: Negative for chills and fever.  HENT: Negative for congestion and rhinorrhea.   Eyes: Negative for redness and visual disturbance.  Respiratory: Positive for shortness of breath. Negative for wheezing.   Cardiovascular: Positive for chest pain. Negative for palpitations.  Gastrointestinal: Negative for nausea and vomiting.  Genitourinary: Negative for dysuria and urgency.  Musculoskeletal: Negative for arthralgias and myalgias.  Skin: Negative for pallor and wound.  Neurological: Negative for dizziness and headaches.     Physical Exam Updated Vital Signs BP (!) 144/115 (BP Location: Right Arm)   Pulse 62   Temp 98.4 F (36.9 C) (Oral)   Resp 20   SpO2 100%   Physical Exam Vitals signs and nursing note reviewed.  Constitutional:      General: She is not in acute distress.    Appearance: She  is well-developed. She is not diaphoretic.  HENT:     Head: Normocephalic and atraumatic.  Eyes:     Pupils: Pupils are equal, round, and reactive to light.  Neck:     Musculoskeletal: Normal range of motion and neck supple.  Cardiovascular:     Rate and Rhythm: Normal rate and regular rhythm.     Heart sounds: No murmur. No friction rub. No gallop.   Pulmonary:     Effort: Pulmonary effort is normal.     Breath sounds: No wheezing or rales.  Chest:     Comments: No appreciable pain on my exam though the patient does have pain when she sits up or twist to the left. Abdominal:     General: There is no distension.     Palpations: Abdomen is soft.     Tenderness: There is no abdominal tenderness.  Musculoskeletal:  General: No tenderness.  Skin:    General: Skin is warm and dry.  Neurological:     Mental Status: She is alert and oriented to person, place, and time.  Psychiatric:        Behavior: Behavior normal.      ED Treatments / Results  Labs (all labs ordered are listed, but only abnormal results are displayed) Labs Reviewed  TROPONIN I (HIGH SENSITIVITY)    EKG EKG Interpretation  Date/Time:  Wednesday May 15 2019 21:34:35 EDT Ventricular Rate:  60 PR Interval:    QRS Duration: 96 QT Interval:  426 QTC Calculation: 426 R Axis:   23 Text Interpretation:  Sinus rhythm Consider right atrial enlargement Low voltage, precordial leads Abnormal R-wave progression, early transition Borderline T abnormalities, diffuse leads No significant change since last tracing Confirmed by Deno Etienne [phone]) on 05/15/2019 10:41:38 PM   Radiology Dg Chest 2 View  Result Date: 05/15/2019 CLINICAL DATA:  Chest pain EXAM: CHEST - 2 VIEW COMPARISON:  04/02/2018 FINDINGS: No consolidation or pleural effusion. Normal cardiomediastinal silhouette. No pneumothorax. IMPRESSION: No active cardiopulmonary disease. Electronically Signed   By: Donavan Foil M.D.   On: 05/15/2019 23:08     Procedures Procedures (including critical care time)  Medications Ordered in ED Medications  ketorolac (TORADOL) 15 MG/ML injection 15 mg (15 mg Intramuscular Given 05/15/19 2305)  oxyCODONE (Oxy IR/ROXICODONE) immediate release tablet 5 mg (5 mg Oral Given 05/15/19 2305)     Initial Impression / Assessment and Plan / ED Course  I have reviewed the triage vital signs and the nursing notes.  Pertinent labs & imaging results that were available during my care of the patient were reviewed by me and considered in my medical decision making (see chart for details).        48 yo F with a chief complaint of chest pain.  Atypical in nature.  Patient had lab work done at [facility] earlier today and had a troponin that was negative otherwise unremarkable no significant anemia no significant electrolyte abnormalities.  Chest x-ray here is negative for pneumonia or pneumothorax.   11:24 PM:  I have discussed the diagnosis/risks/treatment options with the patient and believe the pt to be eligible for discharge home to follow-up with PCP. We also discussed returning to the ED immediately if new or worsening sx occur. We discussed the sx which are most concerning (e.g., sudden worsening pain, fever, inability to tolerate by mouth) that necessitate immediate return. Medications administered to the patient during their visit and any new prescriptions provided to the patient are listed below.  Medications given during this visit Medications  ketorolac (TORADOL) 15 MG/ML injection 15 mg (15 mg Intramuscular Given 05/15/19 2305)  oxyCODONE (Oxy IR/ROXICODONE) immediate release tablet 5 mg (5 mg Oral Given 05/15/19 2305)     The patient appears reasonably screen and/or stabilized for discharge and I doubt any other medical condition or other [facility] requiring further screening, evaluation, or treatment in the ED at this time prior to discharge.     Final Clinical Impressions(s) / ED Diagnoses    Final diagnoses:  Chest wall pain    ED Discharge Orders    None       Deno Etienne, DO 05/15/19 2324

## 2019-05-25 ENCOUNTER — Other Ambulatory Visit: Payer: Self-pay | Admitting: Family Medicine

## 2019-05-25 DIAGNOSIS — G43009 Migraine without aura, not intractable, without status migrainosus: Secondary | ICD-10-CM

## 2019-05-25 NOTE — Telephone Encounter (Signed)
Requested Prescriptions  Pending Prescriptions Disp Refills  . ibuprofen (ADVIL) 800 MG tablet [Pharmacy Med Name: IBUPROFEN 800 MG TABLET] 90 tablet 0    Sig: TAKE 1 TABLET BY MOUTH EVERY DAY AS NEEDED     Analgesics:  NSAIDS Passed - 05/25/2019 12:44 AM      Passed - Cr in normal range and within 360 days    Creat  Date Value Ref Range Status  05/28/2018 1.08 0.50 - 1.10 mg/dL Final   Creatinine, Ser  Date Value Ref Range Status  05/15/2019 0.96 0.44 - 1.00 mg/dL Final         Passed - HGB in normal range and within 360 days    Hemoglobin  Date Value Ref Range Status  05/15/2019 14.5 12.0 - 15.0 g/dL Final   HGB  Date Value Ref Range Status  07/23/2014 14.6 12.0 - 16.0 g/dL Final         Passed - Patient is not pregnant      Passed - Valid encounter within last 12 months    Recent Outpatient Visits          2 months ago Diabetes mellitus type 2 in obese [facility])   [facility] Steele Sizer, MD   6 months ago Diabetes mellitus type 2 in obese [facility])   [facility] Steele Sizer, MD   12 months ago Diabetes mellitus type 2 in obese [facility])   [facility] Steele Sizer, MD   1 year ago Cough   [place], FNP   1 year ago Cough   [place], NP      Future Appointments            In 2 months Steele Sizer, MD [facility], [facility]

## 2019-07-24 ENCOUNTER — Other Ambulatory Visit: Payer: Self-pay | Admitting: Family Medicine

## 2019-07-24 DIAGNOSIS — I10 Essential (primary) hypertension: Secondary | ICD-10-CM

## 2019-07-24 DIAGNOSIS — G43009 Migraine without aura, not intractable, without status migrainosus: Secondary | ICD-10-CM

## 2019-07-24 NOTE — Telephone Encounter (Signed)
Requested medication (s) are due for refill today: yes  Requested medication (s) are on the active medication list: yes  Last refill: 05/02/2019  Future visit scheduled: yes  Notes to clinic:  Refill cannot be delegated    Requested Prescriptions  Pending Prescriptions Disp Refills   ondansetron (ZOFRAN-ODT) 4 MG disintegrating tablet [Pharmacy Med Name: ONDANSETRON ODT 4 MG TABLET] 20 tablet 0    Sig: TAKE 1 TABLET BY MOUTH EVERY 8 HOURS AS NEEDED FOR NAUSEA AND VOMITING     Not Delegated - Gastroenterology: Antiemetics Failed - 07/24/2019  3:07 PM      Failed - This refill cannot be delegated      Passed - Valid encounter within last 6 months    Recent Outpatient Visits          4 months ago Diabetes mellitus type 2 in obese [facility])   [facility] Steele Sizer, MD   8 months ago Diabetes mellitus type 2 in obese [facility])   [facility] Steele Sizer, MD   1 year ago Diabetes mellitus type 2 in obese [facility])   [facility] Steele Sizer, MD   1 year ago Cough   [place], FNP   1 year ago Cough   [place], NP      Future Appointments            In 2 days Steele Sizer, MD [facility], [facility]

## 2019-07-25 ENCOUNTER — Other Ambulatory Visit: Payer: Self-pay | Admitting: Family Medicine

## 2019-07-25 DIAGNOSIS — E1169 Type 2 diabetes mellitus with other specified complication: Secondary | ICD-10-CM

## 2019-07-25 DIAGNOSIS — I10 Essential (primary) hypertension: Secondary | ICD-10-CM

## 2019-07-26 ENCOUNTER — Encounter: Payer: Self-pay | Admitting: Family Medicine

## 2019-07-26 ENCOUNTER — Ambulatory Visit (INDEPENDENT_AMBULATORY_CARE_PROVIDER_SITE_OTHER): Payer: BC Managed Care – PPO | Admitting: Family Medicine

## 2019-07-26 ENCOUNTER — Other Ambulatory Visit: Payer: Self-pay

## 2019-07-26 DIAGNOSIS — Z23 Encounter for immunization: Secondary | ICD-10-CM | POA: Diagnosis not present

## 2019-07-26 DIAGNOSIS — Z8673 Personal history of transient ischemic attack (TIA), and cerebral infarction without residual deficits: Secondary | ICD-10-CM

## 2019-07-26 DIAGNOSIS — E039 Hypothyroidism, unspecified: Secondary | ICD-10-CM

## 2019-07-26 DIAGNOSIS — E1169 Type 2 diabetes mellitus with other specified complication: Secondary | ICD-10-CM | POA: Diagnosis not present

## 2019-07-26 DIAGNOSIS — I10 Essential (primary) hypertension: Secondary | ICD-10-CM

## 2019-07-26 DIAGNOSIS — G43009 Migraine without aura, not intractable, without status migrainosus: Secondary | ICD-10-CM

## 2019-07-26 DIAGNOSIS — E559 Vitamin D deficiency, unspecified: Secondary | ICD-10-CM

## 2019-07-26 DIAGNOSIS — E669 Obesity, unspecified: Secondary | ICD-10-CM

## 2019-07-26 DIAGNOSIS — E785 Hyperlipidemia, unspecified: Secondary | ICD-10-CM

## 2019-07-26 MED ORDER — ONDANSETRON 4 MG PO TBDP: 4.0000 mg | ORAL_TABLET | Freq: Three times a day (TID) | ORAL | 0 refills | Status: DC | PRN

## 2019-07-26 MED ORDER — BUTALBITAL-APAP-CAFFEINE 50-325-40 MG PO TABS: 1.0000 | ORAL_TABLET | Freq: Four times a day (QID) | ORAL | 0 refills | Status: DC | PRN

## 2019-07-26 MED ORDER — UBRELVY 100 MG PO TABS: 1.0000 | ORAL_TABLET | Freq: Every day | ORAL | 0 refills | Status: DC | PRN

## 2019-07-26 MED ORDER — OLMESARTAN-AMLODIPINE-HCTZ 40-10-25 MG PO TABS: 1.0000 | ORAL_TABLET | Freq: Every day | ORAL | 0 refills | Status: DC

## 2019-07-26 MED ORDER — PREDNISONE 10 MG PO TABS: 10.0000 mg | ORAL_TABLET | Freq: Every day | ORAL | 0 refills | Status: DC

## 2019-07-26 NOTE — Patient Instructions (Signed)

## 2019-07-26 NOTE — Progress Notes (Signed)
Name: Tiffany Nunez   MRN: [id number]    DOB: [birth year]   Date:07/26/2019       Progress Note  Subjective  Chief Complaint  Chief Complaint  Patient presents with  . Diabetes  . Depression  . Dyslipidemia  . Hypertension    HPI  DMII :hgbA1Clast visit was   6.5%, we will recheck at lab today  .We stopped Metformin in 2017 because of diarrhea. Shewason Trulicity since Fall [id number] tolerating well, but got tired of giving herself injections weekly and also stopped because of cost and stopped taking medications.She denies polyphagia, polydipsia or polyuria. She has not been very compliant with diet since increase in stress, but she will resume it now.   Migraine headaches: Migraine is described as frontal pain, throbbing like, associated with photophobia and nausea, sometimes vomiting. She described the aura as scotomas. Aimovig helpedbut it was too expensive and not sure if helped that much. Sheis taking fioricet but does not seem to be helping as much, she has been taking a lot of otc medication and medication does not seem to help. This episode started 3 days ago, she has been drinking sodas again. We will try [name], gave her prednisone for the next 12 hours to break the cycle this time around   Major Depression: she has a history of recurrent depression. 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 Having problems sleeping, change in appetite. She is not on medication, states no lack of motivation or sadness, but cried during her visit today   Hypothyroidism:she stopped seeing [name] and has been out of Synthroid for months.Last TSHwas normal without medication,we will recheck it today   HTN:bp is high again, we will adjust dose of Tribenzor from 40/5/25 to 40/10/25, she denies chest pain or palpitation   Morbid obesity: shetried  carbohydrated restrictive diet, but since she "divorced" from her church she has been very stressed out and has gained the weight back, no longer prepping her meals.   History of CVA:  She states she has occasional episodes of dizziness, but otherwise not problems.    Patient Active Problem List   Diagnosis Date Noted  . Mild episode of recurrent major depressive disorder ([place]) 05/28/2018  . History of cerebrovascular accident (CVA) involving cerebellum 06/12/2017  . Acquired autoimmune hypothyroidism 11/09/2016  . Vitamin D deficiency 05/03/2015  . Dyslipidemia 05/03/2015  . Gastro-esophageal reflux disease without esophagitis 05/03/2015  . Alopecia 05/03/2015  . Genital herpes 05/03/2015  . Dysmetabolic syndrome 99991111  . Positive H. pylori test 05/03/2015  . Neuralgia neuritis, sciatic nerve 05/03/2015  . Migraine without aura and responsive to treatment 04/30/2014  . HTN (hypertension) 01/22/2014  . Morbid obesity ([place]) 01/22/2014  . Diabetes mellitus type 2 in obese ([place]) 01/22/2014  . Decreased cardiac ejection fraction 01/22/2014    Past Surgical History:  Procedure Laterality Date  . ABDOMINAL HYSTERECTOMY    . ANKLE SURGERY     right  . TUBAL LIGATION      Family History  Problem Relation Age of Onset  . Hypertension Father   . Hyperlipidemia Father   . Hyperlipidemia Mother   . Hypertension Mother   . Hypertension Paternal Aunt   . Cancer Maternal Grandmother        breast    Social History   Socioeconomic History  . Marital status: Divorced  Spouse name: Not on file  . Number of children: 3  . Years of education: Not on file  . Highest education level: Associate degree: occupational, Hotel manager, or vocational program  Occupational History    Comment: [employer]  Social Needs  . Financial resource strain: Somewhat hard  . Food insecurity    Worry: Sometimes true    Inability: Patient refused  . Transportation needs    Medical: Yes     Non-medical: Yes  Tobacco Use  . Smoking status: Never Smoker  . Smokeless tobacco: Never Used  Substance and Sexual Activity  . Alcohol use: No    Alcohol/week: 0.0 standard drinks  . Drug use: No  . Sexual activity: Not on file  Lifestyle  . Physical activity    Days per week: 2 days    Minutes per session: 30 min  . Stress: Rather much  Relationships  . Social connections    Talks on phone: More than three times a week    Gets together: More than three times a week    Attends religious service: More than 4 times per year    Active member of club or organization: Yes    Attends meetings of clubs or organizations: More than 4 times per year    Relationship status: Divorced  . Intimate partner violence    Fear of current or ex partner: No    Emotionally abused: No    Physically abused: No    Forced sexual activity: No  Other Topics Concern  . Not on file  Social History Narrative  . Not on file     Current Outpatient Medications:  .  Albuterol Sulfate (PROAIR RESPICLICK) [id number] (90 Base) MCG/ACT AEPB, Inhale 1-2 puffs into the lungs every 6 (six) hours as needed (Wheezing or Shortness of breath)., Disp: 1 each, Rfl: 0 .  aspirin EC 81 MG tablet, Take 1 tablet (81 mg total) by mouth daily., Disp: 100 tablet, Rfl: 2 .  atorvastatin (LIPITOR) 40 MG tablet, Take 1 tablet (40 mg total) by mouth every other day., Disp: 15 tablet, Rfl: 3 .  butalbital-acetaminophen-caffeine (FIORICET) 50-325-40 MG tablet, Take 1-2 tablets by mouth every 6 (six) hours as needed for headache., Disp: 60 tablet, Rfl: 0 .  Cholecalciferol (VITAMIN D) 2000 units CAPS, Take 1 capsule (2,000 Units total) by mouth daily., Disp: 100 capsule, Rfl: 2 .  hydrALAZINE (APRESOLINE) 50 MG tablet, TAKE 1 TABLET BY MOUTH TWICE A DAY, Disp: 180 tablet, Rfl: 1 .  ibuprofen (ADVIL) 800 MG tablet, TAKE 1 TABLET BY MOUTH EVERY DAY AS NEEDED, Disp: 90 tablet, Rfl: 0 .  nadolol (CORGARD) 40 MG tablet, TAKE 1 TABLET BY MOUTH  TWICE A DAY, Disp: 180 tablet, Rfl: 1 .  ondansetron (ZOFRAN-ODT) 4 MG disintegrating tablet, Take 1 tablet (4 mg total) by mouth every 8 (eight) hours as needed for nausea or vomiting., Disp: 20 tablet, Rfl: 0 .  Olmesartan-amLODIPine-HCTZ (TRIBENZOR) 40-10-25 MG TABS, Take 1 tablet by mouth daily., Disp: 90 tablet, Rfl: 0 .  predniSONE (DELTASONE) 10 MG tablet, Take 1 tablet (10 mg total) by mouth daily with breakfast., Disp: 6 tablet, Rfl: 0 .  Ubrogepant (UBRELVY) 100 MG TABS, Take 1 tablet by mouth daily as needed., Disp: 10 tablet, Rfl: 0  Allergies  Allergen Reactions  . Imitrex [Sumatriptan] Anaphylaxis  . Topamax [Topiramate] Other (See Comments)    Caused hand numbness     I personally reviewed active problem list, medication list, family history with  the patient/caregiver today.   ROS  Constitutional: Negative for fever , positive for weight change.  Respiratory: Negative for cough and shortness of breath.   Cardiovascular: Negative for chest pain or palpitations.  Gastrointestinal: Negative for abdominal pain, no bowel changes.  Musculoskeletal: Negative for gait problem or joint swelling.  Skin: Negative for rash.  Neurological: Negative for dizziness, positive for  headache.  No other specific complaints in a complete review of systems (except as listed in HPI above).  Objective  Vitals:   07/26/19 0803  BP: (!) 150/96  Pulse: 70  Resp: 16  Temp: (!) 97.3 F (36.3 C)  TempSrc: Temporal  SpO2: 98%  Weight: (!) 309 lb (140.2 kg)  Height: 5\' 7"  (1.702 m)    Body mass index is 48.4 kg/m.  Physical Exam  Constitutional: Patient appears well-developed and well-nourished. Obese  No distress.  HEENT: head atraumatic, normocephalic, pupils equal and reactive to light Cardiovascular: Normal rate, regular rhythm and normal heart sounds.  No murmur heard. Trace  BLE edema. Pulmonary/Chest: Effort normal and breath sounds normal. No respiratory distress. Abdominal:  Soft.  There is no tenderness. Psychiatric: Patient has a normal mood and affect. behavior is normal. Judgment and thought content normal.  Recent Results (from the past 2160 hour(s))  Basic metabolic panel     Status: Abnormal   Collection Time: 05/15/19  7:49 PM  Result Value Ref Range   Sodium 139 135 - 145 mmol/L   Potassium 3.0 (L) 3.5 - 5.1 mmol/L   Chloride 99 98 - 111 mmol/L   CO2 30 22 - 32 mmol/L   Glucose, Bld 117 (H) 70 - 99 mg/dL   BUN 11 6 - 20 mg/dL   Creatinine, Ser 0.96 0.44 - 1.00 mg/dL   Calcium 10.1 8.9 - 10.3 mg/dL   GFR calc non Af Amer >60 >60 mL/min   GFR calc Af Amer >60 >60 mL/min   Anion gap 10 5 - 15    Comment: Performed at [facility], [place] [address] [place], [place] [address]  CBC     Status: None   Collection Time: 05/15/19  7:49 PM  Result Value Ref Range   WBC 5.8 4.0 - 10.5 K/uL   RBC 4.82 3.87 - 5.11 MIL/uL   Hemoglobin 14.5 12.0 - 15.0 g/dL   HCT 43.9 36.0 - 46.0 %   MCV 91.1 80.0 - 100.0 fL   MCH 30.1 26.0 - 34.0 pg   MCHC 33.0 30.0 - 36.0 g/dL   RDW 13.3 11.5 - 15.5 %   Platelets 400 150 - 400 K/uL   nRBC 0.0 0.0 - 0.2 %    Comment: Performed at [facility], [place] [address] [place], [place] [address]  Troponin I (High Sensitivity)     Status: None   Collection Time: 05/15/19  7:49 PM  Result Value Ref Range   Troponin I (High Sensitivity) 8 <18 ng/L    Comment: (NOTE) Elevated high sensitivity troponin I (hsTnI) values and significant  changes across serial measurements may suggest ACS but many other  chronic and acute conditions are known to elevate hsTnI results.  Refer to the "Links" section for chest pain algorithms and additional  guidance. Performed at [facility], [place] [address] [place], [place] [address]   I-Stat beta hCG blood, ED     Status: None   Collection Time: 05/15/19  7:58 PM  Result Value Ref Range   I-stat hCG, quantitative <  5.0 <5 mIU/mL   Comment 3            Comment:   GEST. AGE       CONC.  (mIU/mL)   <=1 WEEK        5 - 50     2 WEEKS       50 - 500     3 WEEKS       100 - 10,000     4 WEEKS     1,000 - 30,000        FEMALE AND NON-PREGNANT FEMALE:     LESS THAN 5 mIU/mL   Troponin I (High Sensitivity)     Status: None   Collection Time: 05/15/19 10:23 PM  Result Value Ref Range   Troponin I (High Sensitivity) 6 <18 ng/L    Comment: (NOTE) Elevated high sensitivity troponin I (hsTnI) values and significant  changes across serial measurements may suggest ACS but many other  chronic and acute conditions are known to elevate hsTnI results.  Refer to the "Links" section for chest pain algorithms and additional  guidance. Performed at [facility], [place] [place], [place] [address]     Diabetic Foot Exam: Diabetic Foot Exam - Simple   Simple Foot Form Diabetic Foot exam was performed with the following findings: Yes 07/26/2019  8:34 AM  Visual Inspection No deformities, no ulcerations, no other skin breakdown bilaterally: Yes Sensation Testing Intact to touch and monofilament testing bilaterally: Yes Pulse Check Posterior Tibialis and Dorsalis pulse intact bilaterally: Yes Comments      PHQ2/9: Depression screen [facility] 2/9 07/26/2019 11/20/2018 05/28/2018 04/02/2018 11/09/2016  Decreased Interest 0 0 0 0 0  Down, Depressed, Hopeless 0 0 1 0 0  PHQ - 2 Score 0 0 1 0 0  Altered sleeping 3 2 3  0 -  Tired, decreased energy 3 1 3  0 -  Change in appetite 2 0 3 0 -  Feeling bad or failure about yourself  0 0 0 0 -  Trouble concentrating 0 0 0 0 -  Moving slowly or fidgety/restless 0 0 0 0 -  Suicidal thoughts 0 0 0 0 -  PHQ-9 Score 8 3 10  0 -  Difficult doing work/chores Not difficult at all Not difficult at all - - -    phq 9 is positive   Fall Risk: Fall Risk  07/26/2019 03/25/2019 11/20/2018 05/28/2018 03/30/2018  Falls in the past year? 0 - 1 No No  Number falls in past yr: 0 0 1 - -  Injury with Fall? 0 0 0 - -  Risk for fall due to : - -  Impaired balance/gait - -     Functional Status Survey: Is the patient deaf or have difficulty hearing?: No Does the patient have difficulty seeing, even when wearing glasses/contacts?: No Does the patient have difficulty concentrating, remembering, or making decisions?: No Does the patient have difficulty walking or climbing stairs?: No Does the patient have difficulty dressing or bathing?: No Does the patient have difficulty doing errands alone such as visiting a doctor's office or shopping?: No   Assessment & Plan  1. Diabetes mellitus type 2 in obese (HCC)  - Hemoglobin A1c  2. Essential hypertension  - Olmesartan-amLODIPine-HCTZ (TRIBENZOR) 40-10-25 MG TABS; Take 1 tablet by mouth daily.  Dispense: 90 tablet; Refill: 0 - COMPLETE METABOLIC PANEL WITH GFR - CBC with Differential/Platelet  3. Migraine without aura and responsive to treatment  -  butalbital-acetaminophen-caffeine (FIORICET) 50-325-40 MG tablet; Take 1-2 tablets by mouth every 6 (six) hours as needed for headache.  Dispense: 60 tablet; Refill: 0 - ondansetron (ZOFRAN-ODT) 4 MG disintegrating tablet; Take 1 tablet (4 mg total) by mouth every 8 (eight) hours as needed for nausea or vomiting.  Dispense: 20 tablet; Refill: 0 - predniSONE (DELTASONE) 10 MG tablet; Take 1 tablet (10 mg total) by mouth daily with breakfast.  Dispense: 6 tablet; Refill: 0 - Ubrogepant (UBRELVY) 100 MG TABS; Take 1 tablet by mouth daily as needed.  Dispense: 10 tablet; Refill: 0  4. History of cerebrovascular accident (CVA) involving cerebellum   5. Morbid obesity ([place])  Discussed with the patient the risk posed by an increased BMI. Discussed importance of portion control, calorie counting and at least 150 minutes of physical activity weekly. Avoid sweet beverages and drink more water. Eat at least 6 servings of fruit and vegetables daily   6. Vitamin D deficiency  Continue vitamin D supplementation   7. Acquired hypothyroidism  -  TSH  8. Dyslipidemia  - Lipid panel   9. Needs flu shot  - Flu Vaccine QUAD 6+ mos PF IM (Fluarix Quad PF)

## 2019-07-27 LAB — CBC WITH DIFFERENTIAL/PLATELET
Absolute Monocytes: 360 cells/uL (ref 200–950)
Basophils Absolute: 42 cells/uL (ref 0–200)
Basophils Relative: 0.8 %
Eosinophils Absolute: 143 cells/uL (ref 15–500)
Eosinophils Relative: 2.7 %
HCT: 40.7 % (ref 35.0–45.0)
Hemoglobin: 13.8 g/dL (ref 11.7–15.5)
Lymphs Abs: 1855 cells/uL (ref 850–3900)
MCH: 29.9 pg (ref 27.0–33.0)
MCHC: 33.9 g/dL (ref 32.0–36.0)
MCV: 88.3 fL (ref 80.0–100.0)
MPV: 10.5 fL (ref 7.5–12.5)
Monocytes Relative: 6.8 %
Neutro Abs: 2899 cells/uL (ref 1500–7800)
Neutrophils Relative %: 54.7 %
Platelets: 392 10*3/uL (ref 140–400)
RBC: 4.61 10*6/uL (ref 3.80–5.10)
RDW: 13.5 % (ref 11.0–15.0)
Total Lymphocyte: 35 %
WBC: 5.3 10*3/uL (ref 3.8–10.8)

## 2019-07-27 LAB — COMPLETE METABOLIC PANEL WITH GFR
AG Ratio: 1.2 (calc) (ref 1.0–2.5)
ALT: 76 U/L — ABNORMAL HIGH (ref 6–29)
AST: 37 U/L — ABNORMAL HIGH (ref 10–35)
Albumin: 4 g/dL (ref 3.6–5.1)
Alkaline phosphatase (APISO): 105 U/L (ref 31–125)
BUN: 18 mg/dL (ref 7–25)
CO2: 30 mmol/L (ref 20–32)
Calcium: 9.7 mg/dL (ref 8.6–10.2)
Chloride: 103 mmol/L (ref 98–110)
Creat: 0.95 mg/dL (ref 0.50–1.10)
GFR, Est African American: 82 mL/min/{1.73_m2} (ref >=60)
GFR, Est Non African American: 71 mL/min/{1.73_m2} (ref >=60)
Globulin: 3.3 g/dL (calc) (ref 1.9–3.7)
Glucose, Bld: 123 mg/dL — ABNORMAL HIGH (ref 65–99)
Potassium: 3.7 mmol/L (ref 3.5–5.3)
Sodium: 141 mmol/L (ref 135–146)
Total Bilirubin: 0.4 mg/dL (ref 0.2–1.2)
Total Protein: 7.3 g/dL (ref 6.1–8.1)

## 2019-07-27 LAB — HEMOGLOBIN A1C
Hgb A1c MFr Bld: 6.4 % of total Hgb — ABNORMAL HIGH (ref <5.7)
Mean Plasma Glucose: 137 (calc)
eAG (mmol/L): 7.6 (calc)

## 2019-07-27 LAB — TSH: TSH: 4.6 mIU/L — ABNORMAL HIGH

## 2019-07-27 LAB — LIPID PANEL
Cholesterol: 197 mg/dL (ref <200)
HDL: 51 mg/dL (ref >=50)
LDL Cholesterol (Calc): 121 mg/dL (calc) — ABNORMAL HIGH
Non-HDL Cholesterol (Calc): 146 mg/dL (calc) — ABNORMAL HIGH (ref <130)
Total CHOL/HDL Ratio: 3.9 (calc) (ref <5.0)
Triglycerides: 137 mg/dL (ref <150)

## 2019-09-17 ENCOUNTER — Ambulatory Visit: Payer: Self-pay

## 2019-09-17 NOTE — Telephone Encounter (Signed)
Patient called stating that she has left wrist pin that will travel up her arm to her shoulder at times.  She states the pain has been bad for the past week. 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  She also uses computer for her job. Her fingers and hand will get tingly if she sleeps wrong.  She has had trouble sleeping because of the pain.  She was told in the past that she may have carpel tunnel but that was many years ago. Care advice read to patient.  She verbalized understanding. Per DT patient was scheduled for virtual visit tomorrow. Phone number verified.  Reason for Disposition . [1] MODERATE pain (e.g., interferes with normal activities) AND [2] present > 3 days  Answer Assessment - Initial Assessment Questions 1. ONSET: "When did the pain start?"     1 week 2. LOCATION: "Where is the pain located?"    Left hand wrist up to shoulder 3. PAIN: "How bad is the pain?" (Scale 1-10; or mild, moderate, severe)   - MILD (1-3): doesn't interfere with normal activities   - MODERATE (4-7): interferes with normal activities (e.g., work or school) or awakens from sleep   - SEVERE (8-10): excruciating pain, unable to use hand at all     moderate 4. 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 CAUSE: "What do you think is causing the pain?"   Unsure might be carpal tunnel 6. AGGRAVATING FACTORS: "What makes the pain worse?" (e.g., using computer)     Yes computer 7. OTHER SYMPTOMS: "Do you have any other symptoms?" (e.g., neck pain, swelling, rash, numbness, fever)     Numb and tingling if sleeps on it 8. PREGNANCY: "Is there any chance you are pregnant?" "When was your last menstrual period?"     N/A  histerectomy  Protocols used: HAND AND WRIST PAIN-A-AH

## 2019-09-18 ENCOUNTER — Other Ambulatory Visit: Payer: Self-pay

## 2019-09-18 ENCOUNTER — Encounter: Payer: Self-pay | Admitting: Family Medicine

## 2019-09-18 ENCOUNTER — Ambulatory Visit (INDEPENDENT_AMBULATORY_CARE_PROVIDER_SITE_OTHER): Payer: BC Managed Care – PPO | Admitting: Family Medicine

## 2019-09-18 DIAGNOSIS — M7702 Medial epicondylitis, left elbow: Secondary | ICD-10-CM

## 2019-09-18 MED ORDER — MELOXICAM 15 MG PO TABS: 15.0000 mg | ORAL_TABLET | Freq: Every day | ORAL | 0 refills | Status: DC

## 2019-09-18 NOTE — Patient Instructions (Signed)
Golfer's Elbow  Golfer's elbow, also called medial epicondylitis, is a condition that results from inflammation of the strong bands of tissue (tendons) that attach your forearm muscles to the inside of your bone at the elbow. These tendons affect the muscles that bend the palm toward the wrist (flexion). The tendons become less flexible with age. This condition is called golfer's elbow because it is more common among people who constantly bend and twist their wrists, such as golfers. This injury is usually caused by overuse. What are the causes? This condition is caused by:  Repeatedly flexing, turning, or twisting your wrist.  Constantly gripping objects with your hands. What increases the risk? This condition is more likely to develop in people who play golf, baseball, or tennis. This injury is more common among people who have jobs that require the constant use of their hands, such as:  Carpenters.  Butchers.  Musicians.  Typists. What are the signs or symptoms? This condition causes elbow pain that may spread to your forearm and upper arm. Symptoms of this condition include.  Pain at the inner elbow, forearm, or wrist.  Reduced grip strength. The pain may get worse when you bend your wrist downward. How is this diagnosed? This condition is diagnosed based on your symptoms, medical history, and a physical exam. During the exam, your health care provider may:  Test your grip strength.  Move your wrist to check for pain. You may also have an MRI to:  Confirm the diagnosis.  Look for other issues.  Check for tears in the ligaments, muscles, or tendons. How is this treated? Treatment for this condition includes:  Stopping all activities that make you bend or twist your elbow or wrist and waiting until your pain and other symptoms go away before resuming those activities.  Wearing an elbow brace or wrist splint to restrict the movements that cause symptoms.  Icing your  inner elbow, forearm, or wrist to relieve pain.  Taking NSAIDs or getting corticosteroid injections to reduce pain and swelling.  Doing stretching, range-of-motion, and strengthening exercises (physical therapy) as told by your health care provider. In rare cases, surgery may be needed if your condition does not improve. Follow these instructions at home: If you have a brace or splint:  Wear it as told by your health care provider.  Loosen it if your fingers tingle, become numb, or turn cold and blue.  Keep it clean. Managing pain, stiffness, and swelling   If directed, put ice on the injured area. ? Put ice in a plastic bag. ? Place a towel between your skin and the bag. ? Leave the ice on for 20 minutes, 2-3 times a day.  Move your fingers often to avoid stiffness.  Raise (elevate) the injured area above the level of your heart while you are sitting or lying down. Activity  Rest your injured area as told by your health care provider.  Return to your normal activities as told by your health care provider. Ask your health care provider what activities are safe for you.  Do exercises as told by your health care provider. Lifestyle  If your condition is caused by sports, work with a trainer to make sure that you: ? Have the correct technique. ? Are using the proper equipment.  If your condition is work related, talk with your employer about changes that can be made. General instructions  Take over-the-counter and prescription medicines only as told by your health care provider.  Do not   use any products that contain nicotine or tobacco, such as cigarettes, e-cigarettes, and chewing tobacco. If you need help quitting, ask your health care provider.  Keep all follow-up visits as told by your health care provider. This is important. How is this prevented?  Before and after activity: ? Warm up and stretch before being active. ? Cool down and stretch after being  active. ? Give your body time to rest between periods of activity.  During activity: ? Make sure to use equipment that fits you. ? If you play golf, slow your golf swing to reduce shock in the arm when making contact with the ball.  Maintain physical fitness, including: ? Strength. ? Flexibility. ? Cardiovascular fitness. ? Endurance.  Do exercises to strengthen the forearm muscles. Contact a health care provider if:  Your pain does not improve or it gets worse.  You notice numbness in your hand. Get help right away if:  Your pain is severe.  You cannot move your wrist. Summary  Golfer's elbow, also called medial epicondylitis, is a condition that results from inflammation of the strong bands of tissue (tendons) that attach your forearm muscles to the inside of your bone at the elbow.  This injury usually results from overuse.  Symptoms of this condition include decreased grip strength and pain at the inner elbow, forearm, or wrist.  This injury is treated with rest, ice, medicines, physical therapy, and surgery as needed. This information is not intended to replace advice given to you by your health care provider. Make sure you discuss any questions you have with your health care provider. Document Released: 09/05/2005 Document Revised: 12/27/2018 Document Reviewed: 07/12/2018 Elsevier Patient Education  2020 [employer].

## 2019-09-18 NOTE — Progress Notes (Signed)
Name: Tiffany Nunez   MRN: [id number]    DOB: [birth year]   Date:09/18/2019       Progress Note  Subjective  Chief Complaint  Chief Complaint  Patient presents with  . Hand Pain    left hand pain that radiates up to shoulder. Cannot make a fist    I connected with  Tiffany Nunez  on 09/18/19 at  9:20 AM EST by a video enabled telemedicine application and verified that I am speaking with the correct person using two identifiers.  I discussed the limitations of evaluation and management by telemedicine and the availability of in person appointments. The patient expressed understanding and agreed to proceed. Staff also discussed with the patient that there may be a patient responsible charge related to this service. Patient Location: Home Provider Location: Office Additional Individuals present: None  HPI  Pt presents with concern for LEFT hand and arm pain (shooting and sharp) - started about a week ago and progressively worsening. Pain starts in the pinky and ring finger, down the palm of the hand, to the medial elbow and some radiation into the left upper arm/shoulder.  The pain is waking her up at night if she sleeps on it the wrong way.  She notes history of carpal tunnel many years ago. She is RIGHT hand dominant, works as Education officer, environmental.   Patient Active Problem List   Diagnosis Date Noted  . Mild episode of recurrent major depressive disorder ([place]) 05/28/2018  . History of cerebrovascular accident (CVA) involving cerebellum 06/12/2017  . Acquired autoimmune hypothyroidism 11/09/2016  . Vitamin D deficiency 05/03/2015  . Dyslipidemia 05/03/2015  . Gastro-esophageal reflux disease without esophagitis 05/03/2015  . Alopecia 05/03/2015  . Genital herpes 05/03/2015  . Dysmetabolic syndrome 99991111  . Positive H. pylori test 05/03/2015  . Neuralgia neuritis, sciatic nerve 05/03/2015  . Migraine without aura and responsive to treatment 04/30/2014  . HTN  (hypertension) 01/22/2014  . Morbid obesity ([place]) 01/22/2014  . Diabetes mellitus type 2 in obese ([place]) 01/22/2014  . Decreased cardiac ejection fraction 01/22/2014    Social History   Tobacco Use  . Smoking status: Never Smoker  . Smokeless tobacco: Never Used  Substance Use Topics  . Alcohol use: No    Alcohol/week: 0.0 standard drinks     Current Outpatient Medications:  .  Albuterol Sulfate (PROAIR RESPICLICK) 123XX123 (90 Base) MCG/ACT AEPB, Inhale 1-2 puffs into the lungs every 6 (six) hours as needed (Wheezing or Shortness of breath)., Disp: 1 each, Rfl: 0 .  aspirin EC 81 MG tablet, Take 1 tablet (81 mg total) by mouth daily., Disp: 100 tablet, Rfl: 2 .  atorvastatin (LIPITOR) 40 MG tablet, Take 1 tablet (40 mg total) by mouth every other day., Disp: 15 tablet, Rfl: 3 .  butalbital-acetaminophen-caffeine (FIORICET) 50-325-40 MG tablet, Take 1-2 tablets by mouth every 6 (six) hours as needed for headache., Disp: 60 tablet, Rfl: 0 .  Cholecalciferol (VITAMIN D) 2000 units CAPS, Take 1 capsule (2,000 Units total) by mouth daily., Disp: 100 capsule, Rfl: 2 .  hydrALAZINE (APRESOLINE) 50 MG tablet, TAKE 1 TABLET BY MOUTH TWICE A DAY, Disp: 180 tablet, Rfl: 1 .  ibuprofen (ADVIL) 800 MG tablet, TAKE 1 TABLET BY MOUTH EVERY DAY AS NEEDED, Disp: 90 tablet, Rfl: 0 .  nadolol (CORGARD) 40 MG tablet, TAKE 1 TABLET BY MOUTH TWICE A DAY, Disp: 180 tablet, Rfl: 1 .  Olmesartan-amLODIPine-HCTZ (TRIBENZOR) 40-10-25 MG TABS, Take 1 tablet by  mouth daily., Disp: 90 tablet, Rfl: 0 .  ondansetron (ZOFRAN-ODT) 4 MG disintegrating tablet, Take 1 tablet (4 mg total) by mouth every 8 (eight) hours as needed for nausea or vomiting., Disp: 20 tablet, Rfl: 0 .  predniSONE (DELTASONE) 10 MG tablet, Take 1 tablet (10 mg total) by mouth daily with breakfast., Disp: 6 tablet, Rfl: 0 .  Ubrogepant (UBRELVY) 100 MG TABS, Take 1 tablet by mouth daily as needed., Disp: 10 tablet, Rfl: 0  Allergies  Allergen  Reactions  . Imitrex [Sumatriptan] Anaphylaxis  . Topamax [Topiramate] Other (See Comments)    Caused hand numbness     I personally reviewed active problem list, medication list, allergies, notes from last encounter, lab results with the patient/caregiver today.  ROS  Ten systems reviewed and is negative except as mentioned in HPI   Objective  Virtual encounter, vitals not obtained.  There is no height or weight on file to calculate BMI.  Nursing Note and Vital Signs reviewed.  Physical Exam  Pulmonary/Chest: Effort normal. No respiratory distress. Speaking in complete sentences Neurological: Pt is alert and oriented to person, place, and time. speech and gait are normal.   She is not able to make a completely closed fist on the LEFT hand. Psychiatric: Patient has a normal mood and affect. behavior is normal. Judgment and thought content normal.  No results found for this or any previous visit (from the past 72 hour(s)).  Assessment & Plan  1. Medial epicondylitis of elbow, left - meloxicam (MOBIC) 15 MG tablet; Take 1 tablet (15 mg total) by mouth daily. Do not take with ibuprofen  Dispense: 30 tablet; Refill: 0 - Ambulatory referral to Orthopedics - discussed walk in hours at [facility] as well if she would like to be seen today or tomorrow.  Referral is also in place.  I provided 16 minutes of non-face-to-face time during this encounter.  Hubbard Hartshorn, FNP

## 2019-10-12 ENCOUNTER — Other Ambulatory Visit: Payer: Self-pay | Admitting: Family Medicine

## 2019-10-12 DIAGNOSIS — M7702 Medial epicondylitis, left elbow: Secondary | ICD-10-CM

## 2019-10-15 ENCOUNTER — Other Ambulatory Visit: Payer: Self-pay | Admitting: Family Medicine

## 2019-10-15 DIAGNOSIS — G43009 Migraine without aura, not intractable, without status migrainosus: Secondary | ICD-10-CM

## 2019-10-15 NOTE — Telephone Encounter (Signed)
Requested medication (s) are due for refill today: yes  Requested medication (s) are on the active medication list: yes  Last refill:  07/26/2019  Future visit scheduled: yes  Notes to clinic:  not delegated - Gastroenterology: antiemetics failed  Requested Prescriptions  Pending Prescriptions Disp Refills   ondansetron (ZOFRAN-ODT) 4 MG disintegrating tablet [Pharmacy Med Name: ONDANSETRON ODT 4 MG TABLET] 20 tablet 0    Sig: TAKE 1 TABLET BY MOUTH EVERY 8 HOURS AS NEEDED FOR NAUSEA AND VOMITING      Not Delegated - Gastroenterology: Antiemetics Failed - 10/15/2019  4:33 PM      Failed - This refill cannot be delegated      Passed - Valid encounter within last 6 months    Recent Outpatient Visits           3 weeks ago Medial epicondylitis of elbow, left   [place], FNP   2 months ago Morbid obesity [facility])   [facility] Steele Sizer, MD   6 months ago Diabetes mellitus type 2 in obese [facility])   [facility] [place], Drue Stager, MD   10 months ago Diabetes mellitus type 2 in obese [facility])   [facility] Steele Sizer, MD   1 year ago Diabetes mellitus type 2 in obese [facility])   [facility] Steele Sizer, MD       Future Appointments             In 2 weeks Steele Sizer, MD [facility]

## 2019-10-17 ENCOUNTER — Other Ambulatory Visit: Payer: Self-pay | Admitting: Family Medicine

## 2019-10-17 DIAGNOSIS — I10 Essential (primary) hypertension: Secondary | ICD-10-CM

## 2019-10-29 ENCOUNTER — Encounter: Payer: Self-pay | Admitting: Family Medicine

## 2019-10-29 ENCOUNTER — Ambulatory Visit (INDEPENDENT_AMBULATORY_CARE_PROVIDER_SITE_OTHER): Payer: BC Managed Care – PPO | Admitting: Family Medicine

## 2019-10-29 ENCOUNTER — Other Ambulatory Visit: Payer: Self-pay

## 2019-10-29 VITALS — BP 140/90 | HR 83 | Temp 97.5°F | Resp 16 | Ht 67.0 in | Wt 310.7 lb

## 2019-10-29 DIAGNOSIS — I10 Essential (primary) hypertension: Secondary | ICD-10-CM

## 2019-10-29 DIAGNOSIS — E039 Hypothyroidism, unspecified: Secondary | ICD-10-CM

## 2019-10-29 DIAGNOSIS — E785 Hyperlipidemia, unspecified: Secondary | ICD-10-CM

## 2019-10-29 DIAGNOSIS — Z0000 Encounter for general adult medical examination without abnormal findings: Secondary | ICD-10-CM | POA: Diagnosis not present

## 2019-10-29 DIAGNOSIS — G43009 Migraine without aura, not intractable, without status migrainosus: Secondary | ICD-10-CM

## 2019-10-29 DIAGNOSIS — E559 Vitamin D deficiency, unspecified: Secondary | ICD-10-CM

## 2019-10-29 DIAGNOSIS — F330 Major depressive disorder, recurrent, mild: Secondary | ICD-10-CM

## 2019-10-29 DIAGNOSIS — E669 Obesity, unspecified: Secondary | ICD-10-CM

## 2019-10-29 DIAGNOSIS — G5622 Lesion of ulnar nerve, left upper limb: Secondary | ICD-10-CM

## 2019-10-29 DIAGNOSIS — Z8673 Personal history of transient ischemic attack (TIA), and cerebral infarction without residual deficits: Secondary | ICD-10-CM

## 2019-10-29 DIAGNOSIS — Z1231 Encounter for screening mammogram for malignant neoplasm of breast: Secondary | ICD-10-CM

## 2019-10-29 DIAGNOSIS — Z1159 Encounter for screening for other viral diseases: Secondary | ICD-10-CM

## 2019-10-29 DIAGNOSIS — E1169 Type 2 diabetes mellitus with other specified complication: Secondary | ICD-10-CM

## 2019-10-29 MED ORDER — ATORVASTATIN CALCIUM 40 MG PO TABS: 40.0000 mg | ORAL_TABLET | ORAL | 1 refills | Status: DC

## 2019-10-29 MED ORDER — UBRELVY 100 MG PO TABS: 1.0000 | ORAL_TABLET | Freq: Every day | ORAL | 0 refills | Status: DC | PRN

## 2019-10-29 MED ORDER — DULOXETINE HCL 30 MG PO CPEP: 30.0000 mg | ORAL_CAPSULE | Freq: Every day | ORAL | 0 refills | Status: DC

## 2019-10-29 MED ORDER — OLMESARTAN-AMLODIPINE-HCTZ 40-10-25 MG PO TABS: 1.0000 | ORAL_TABLET | Freq: Every day | ORAL | 0 refills | Status: DC

## 2019-10-29 MED ORDER — PREDNISONE 10 MG PO TABS: 10.0000 mg | ORAL_TABLET | Freq: Every day | ORAL | 0 refills | Status: DC

## 2019-10-29 NOTE — Patient Instructions (Signed)

## 2019-10-29 NOTE — Progress Notes (Signed)
mammogName: Tiffany Nunez   MRN: [id number]    DOB: 1971-08-11   Date:10/29/2019       Progress Note  Subjective  Chief Complaint  Chief Complaint  Patient presents with  . Annual Exam    HPI  Patient presents for annual CPE.  DMII :hgbA1Clast visit was 6.4%, we will recheck at lab today .We stopped Metformin in 2017 because of diarrhea. Shewason Trulicity since Fall [id number] tolerating well, but got tired of giving herself injections weekly and also because of the cost of medication. She denies polyphagia, polydipsia or polyuria.She has not been compliant with her diet. " I am just in a funk right now"  Migraine headaches: Migraine is described as frontal pain, throbbing like, associated with photophobia and nausea, sometimes vomiting. She described the aura as scotomas. Aimovig helped a little but it was too expensive. Sheis taking fioricet but does not seem to be helping as much. She his having episodes a few times a week again, episodes can last up to 2 days. We gave her prednisone last time to abort an episode that was going on for 3 days. We gave her [name] but she is not sure if covered by insurance. We will try sending it again and give her a voucher . She tried topamax but could not tolerate paresthesia as a side effect  Major Depression: she has a history of recurrent depression. 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 She works all day, goes home and goes to bed. No motivation, today she cried during the visit. She talks to her kids and mother daily but still feels isolated. Tired of COVID-19  Hypothyroidism:she stopped seeing [name] and has been out of Synthroid for months.Last TSHwas elevated , not on  medication, we will recheck labs today  HTN:bp is okay today, she is upset, we will continue current dose of 40/10/25 an Nadolol , she denies chest pain or palpitation   Morbid obesity: shetried carbohydrated restrictive diet, but since Fall 2020 when she broke up with her church she has not been doing well emotionally and is not following a diet   History of CVA:  She states she has occasional episodes of dizziness, but otherwise not problems. She needs to resume aspirin daily   Ulnar nerve entrapment: seeing [facility] [place], had NCS and is following up with them    Diet:not following a healthy diet   Exercise: not currently   USPSTF grade A and B recommendations    [facility] Visit from 10/29/2019 in [facility]  AUDIT-C Score  0     Depression: Phq 9 is  positive Depression screen [facility] 2/9 10/29/2019 10/29/2019 09/18/2019 07/26/2019 11/20/2018  Decreased Interest 1 0 0 0 0  Down, Depressed, Hopeless 2 0 0 0 0  PHQ - 2 Score 3 0 0 0 0  Altered sleeping 2 0 '1 3 2  ' Tired, decreased energy 2 0 0 3 1  Change in appetite 0 0 0 2 0  Feeling bad or failure about yourself  0 0 0 0 0  Trouble concentrating 1 0 0 0 0  Moving slowly or fidgety/restless 0 0 0 0 0  Suicidal thoughts 0 0 0 0 0  PHQ-9 Score 8 0 '1 8 3  ' Difficult doing work/chores Somewhat difficult - Not difficult at all Not difficult at all Not difficult at all  Some recent data might be hidden   Hypertension: BP Readings from Last 3 Encounters:  10/29/19 140/90  07/26/19 (!) 150/96  05/15/19 (!) 144/115   Obesity: Wt Readings from Last 3 Encounters:  10/29/19 (!) 310 lb 11.2 oz (140.9 kg)  07/26/19 (!) 309 lb (140.2 kg)  05/15/19 (!) 305 lb (138.3 kg)   BMI Readings from Last 3 Encounters:  10/29/19 48.66 kg/m  07/26/19 48.40 kg/m  05/15/19 47.77 kg/m     Hep C Screening: today  STD testing and prevention (HIV/chl/gon/syphilis): Not interested  Intimate partner violence: negative  screen  Sexual History (Partners/Practices/Protection from [employer] hx STI/Pregnancy Plans): not sexually active in about 6  years Pain during Intercourse:N/A Menstrual History/LMP/Abnormal Bleeding: s/p hysterectomy  Incontinence Symptoms: very seldom stress incontinence when holding her urine for a long time  Breast cancer:  - Last Mammogram: she is due  - BRCA gene screening: breast cancer great-grand-mother and grand-mother. She will think about it   Osteoporosis: Discussed high calcium and vitamin D supplementation, weight bearing exercises  Cervical cancer screening: s/p hysterectomy   Skin cancer: Discussed monitoring for atypical lesions  Colorectal cancer: refer today  Lung cancer:  Low Dose CT Chest recommended if Age 25-80 years, 30 pack-year currently smoking OR have quit w/in 15years. Patient does not qualify.   ECG: 05/2019  Advanced Care Planning: A voluntary discussion about advance care planning including the explanation and discussion of advance directives.  Discussed health care proxy and Living will, and the patient was able to identify a health care proxy as twin daughters.  Patient does not have a living will at present time.   Lipids: Lab Results  Component Value Date   CHOL 197 07/26/2019   CHOL 199 05/28/2018   CHOL 166 06/12/2017   Lab Results  Component Value Date   HDL 51 07/26/2019   HDL 50 (L) 05/28/2018   HDL 51 06/12/2017   Lab Results  Component Value Date   LDLCALC 121 (H) 07/26/2019   LDLCALC 129 (H) 05/28/2018   LDLCALC 96 06/12/2017   Lab Results  Component Value Date   TRIG 137 07/26/2019   TRIG 92 05/28/2018   TRIG 97 06/12/2017   Lab Results  Component Value Date   CHOLHDL 3.9 07/26/2019   CHOLHDL 4.0 05/28/2018   CHOLHDL 3.3 06/12/2017   No results found for: LDLDIRECT  Glucose: Glucose  Date Value Ref Range Status  07/23/2014 98 65 - 99 mg/dL Final  10/02/2012 86 65 - 99 mg/dL Final  12/30/2011 111 (H) 65 - 99 mg/dL  Final   Glucose, Bld  Date Value Ref Range Status  07/26/2019 123 (H) 65 - 99 mg/dL Final    Comment:    .            Fasting reference interval . For someone without known diabetes, a glucose value between 100 and 125 mg/dL is consistent with prediabetes and should be confirmed with a follow-up test. .   05/15/2019 117 (H) 70 - 99 mg/dL Final  03/11/2019 109 (H) 70 - 99 mg/dL Final   Glucose-Capillary  Date Value Ref Range Status  03/11/2019 112 (H) 70 - 99 mg/dL Final    Patient Active Problem List   Diagnosis Date Noted  . Mild episode of recurrent major depressive disorder ([place]) 05/28/2018  . History  of cerebrovascular accident (CVA) involving cerebellum 06/12/2017  . Acquired autoimmune hypothyroidism 11/09/2016  . Vitamin D deficiency 05/03/2015  . Dyslipidemia 05/03/2015  . Gastro-esophageal reflux disease without esophagitis 05/03/2015  . Alopecia 05/03/2015  . Genital herpes 05/03/2015  . Dysmetabolic syndrome 82/95/6213  . Positive H. pylori test 05/03/2015  . Neuralgia neuritis, sciatic nerve 05/03/2015  . Migraine without aura and responsive to treatment 04/30/2014  . HTN (hypertension) 01/22/2014  . Morbid obesity ([place]) 01/22/2014  . Diabetes mellitus type 2 in obese ([place]) 01/22/2014  . Decreased cardiac ejection fraction 01/22/2014    Past Surgical History:  Procedure Laterality Date  . ABDOMINAL HYSTERECTOMY    . ANKLE SURGERY     right  . TUBAL LIGATION      Family History  Problem Relation Age of Onset  . Hypertension Father   . Hyperlipidemia Father   . Hyperlipidemia Mother   . Hypertension Mother   . Hypertension Paternal Aunt   . Cancer Maternal Grandmother        breast    Social History   Socioeconomic History  . Marital status: Divorced    Spouse name: Not on file  . Number of children: 3  . Years of education: Not on file  . Highest education level: Associate degree: occupational, Hotel manager, or vocational program   Occupational History    Comment: [employer]  Tobacco Use  . Smoking status: Never Smoker  . Smokeless tobacco: Never Used  Substance and Sexual Activity  . Alcohol use: No    Alcohol/week: 0.0 standard drinks  . Drug use: No  . Sexual activity: Not on file  Other Topics Concern  . Not on file  Social History Narrative  . Not on file   Social Determinants of Health   Financial Resource Strain: Medium Risk  . Difficulty of Paying Living Expenses: Somewhat hard  Food Insecurity: Food Insecurity Present  . Worried About Charity fundraiser in the Last Year: Sometimes true  . Ran Out of Food in the Last Year: Sometimes true  Transportation Needs: No Transportation Needs  . Lack of Transportation (Medical): No  . Lack of Transportation (Non-Medical): No  Physical Activity: Inactive  . Days of Exercise per Week: 0 days  . Minutes of Exercise per Session: 0 min  Stress: Stress Concern Present  . Feeling of Stress : Very much  Social Connections: Somewhat Isolated  . Frequency of Communication with Friends and Family: More than three times a week  . Frequency of Social Gatherings with Friends and Family: Once a week  . Attends Religious Services: More than 4 times per year  . Active Member of Clubs or Organizations: No  . Attends Archivist Meetings: Never  . Marital Status: Divorced  Human resources officer Violence: Not At Risk  . Fear of Current or Ex-Partner: No  . Emotionally Abused: No  . Physically Abused: No  . Sexually Abused: No     Current Outpatient Medications:  .  atorvastatin (LIPITOR) 40 MG tablet, Take 1 tablet (40 mg total) by mouth every other day., Disp: 15 tablet, Rfl: 3 .  hydrALAZINE (APRESOLINE) 50 MG tablet, TAKE 1 TABLET BY MOUTH TWICE A DAY, Disp: 180 tablet, Rfl: 1 .  ibuprofen (ADVIL) 800 MG tablet, TAKE 1 TABLET BY MOUTH EVERY DAY AS NEEDED, Disp: 90 tablet, Rfl: 0 .  nadolol (CORGARD) 40 MG tablet, TAKE 1 TABLET BY MOUTH TWICE A DAY, Disp:  180 tablet, Rfl: 1 .  Olmesartan-amLODIPine-HCTZ 40-10-25  MG TABS, TAKE 1 TABLET BY MOUTH EVERY DAY, Disp: 90 tablet, Rfl: 0 .  ondansetron (ZOFRAN-ODT) 4 MG disintegrating tablet, TAKE 1 TABLET BY MOUTH EVERY 8 HOURS AS NEEDED FOR NAUSEA AND VOMITING, Disp: 20 tablet, Rfl: 0 .  aspirin EC 81 MG tablet, Take 1 tablet (81 mg total) by mouth daily. (Patient not taking: Reported on 10/29/2019), Disp: 100 tablet, Rfl: 2 .  predniSONE (DELTASONE) 10 MG tablet, Take 1 tablet (10 mg total) by mouth daily with breakfast. (Patient not taking: Reported on 10/29/2019), Disp: 6 tablet, Rfl: 0 .  Ubrogepant (UBRELVY) 100 MG TABS, Take 1 tablet by mouth daily as needed., Disp: 10 tablet, Rfl: 0  Allergies  Allergen Reactions  . Imitrex [Sumatriptan] Anaphylaxis  . Topamax [Topiramate] Other (See Comments)    Caused hand numbness      ROS  Constitutional: Negative for fever or weight change.  Respiratory: Negative for cough and shortness of breath.   Cardiovascular: Negative for chest pain or palpitations.  Gastrointestinal: Negative for abdominal pain, no bowel changes.  Musculoskeletal: Negative for gait problem or joint swelling.  Skin: Negative for rash.  Neurological: Negative for dizziness or headache.  No other specific complaints in a complete review of systems (except as listed in HPI above).  Objective   Vitals:   10/29/19 0741  BP: 140/90  Pulse: 83  Resp: 16  Temp: (!) 97.5 F (36.4 C)  TempSrc: Temporal  SpO2: 97%  Weight: (!) 310 lb 11.2 oz (140.9 kg)  Height: '5\' 7"'  (1.702 m)    Body mass index is 48.66 kg/m.  Physical Exam  Constitutional: Patient appears well-developed and well-nourished. Obese No distress.  HENT: Head: Normocephalic and atraumatic. Ears: B TMs ok, no erythema or effusion; Nose not done Eyes: Conjunctivae and EOM are normal. Pupils are equal, round, and reactive to light. No scleral icterus.  Neck: Normal range of motion. Neck supple. No JVD present. No  thyromegaly present.  Cardiovascular: Normal rate, regular rhythm and normal heart sounds.  No murmur heard. No BLE edema. Pulmonary/Chest: Effort normal and breath sounds normal. No respiratory distress. Abdominal: Soft. Bowel sounds are normal, no distension. There is no tenderness. no masses Breast: no lumps or masses, no nipple discharge or rashes FEMALE GENITALIA:  Not done RECTAL: not done Musculoskeletal: Normal range of motion, no joint effusions. No gross deformities Neurological: he is alert and oriented to person, place, and time. No cranial nerve deficit. Coordination, balance, strength, speech and gait are normal.  Skin: Skin is warm and dry. No rash noted. No erythema.  Psychiatric: Patient has a normal mood and affect. behavior is normal. Judgment and thought content normal.   Fall Risk: Fall Risk  10/29/2019 09/18/2019 07/26/2019 03/25/2019 11/20/2018  Falls in the past year? 0 0 0 - 1  Number falls in past yr: 0 0 0 0 1  Injury with Fall? 0 0 0 0 0  Risk for fall due to : - - - - Impaired balance/gait  Follow up - Falls evaluation completed - - -     Functional Status Survey: Is the patient deaf or have difficulty hearing?: No Does the patient have difficulty seeing, even when wearing glasses/contacts?: No Does the patient have difficulty concentrating, remembering, or making decisions?: No Does the patient have difficulty walking or climbing stairs?: No Does the patient have difficulty dressing or bathing?: No Does the patient have difficulty doing errands alone such as visiting a doctor's office or shopping?: No  Assessment & Plan   1. Well adult exam   2. Diabetes mellitus type 2 in obese (HCC)  - Hemoglobin A1c - Microalbumin / creatinine urine ratio  3. Vitamin D deficiency  - VITAMIN D 25 Hydroxy (Vit-D Deficiency, Fractures)  4. Dyslipidemia   5. Acquired hypothyroidism  - TSH  6. Essential hypertension  - COMPLETE METABOLIC PANEL WITH GFR -  Olmesartan-amLODIPine-HCTZ 40-10-25 MG TABS; Take 1 tablet by mouth daily.  Dispense: 90 tablet; Refill: 0  7. Need for hepatitis C screening test  - Hepatitis C antibody  8. History of cerebrovascular accident (CVA) involving cerebellum  - atorvastatin (LIPITOR) 40 MG tablet; Take 1 tablet (40 mg total) by mouth every other day.  Dispense: 45 tablet; Refill: 1  9. Migraine without aura and responsive to treatment  - Ubrogepant (UBRELVY) 100 MG TABS; Take 1 tablet by mouth daily as needed.  Dispense: 10 tablet; Refill: 0 - predniSONE (DELTASONE) 10 MG tablet; Take 1 tablet (10 mg total) by mouth daily with breakfast.  Dispense: 6 tablet; Refill: 0  10. Morbid obesity ([place])   11. Mild episode of recurrent major depressive disorder (HCC)  - DULoxetine (CYMBALTA) 30 MG capsule; Take 1-2 capsules (30-60 mg total) by mouth daily.  Dispense: 60 capsule; Refill: 0  12. Ulnar nerve entrapment at elbow, left  Keep follow up with Ortho  13. Breast cancer screening by mammogram  - MM 3D SCREEN BREAST BILATERAL; Future -USPSTF grade A and B recommendations reviewed with patient; age-appropriate recommendations, preventive care, screening tests, etc discussed and encouraged; healthy living encouraged; see AVS for patient education given to patient -Discussed importance of 150 minutes of physical activity weekly, eat two servings of fish weekly, eat one serving of tree nuts ( cashews, pistachios, pecans, almonds.Marland Kitchen) every other day, eat 6 servings of fruit/vegetables daily and drink plenty of water and avoid sweet beverages.

## 2019-10-30 LAB — CBC WITH DIFFERENTIAL/PLATELET
Absolute Monocytes: 493 cells/uL (ref 200–950)
Basophils Absolute: 39 cells/uL (ref 0–200)
Basophils Relative: 0.5 %
Eosinophils Absolute: 92 cells/uL (ref 15–500)
Eosinophils Relative: 1.2 %
HCT: 40.5 % (ref 35.0–45.0)
Hemoglobin: 13.8 g/dL (ref 11.7–15.5)
Lymphs Abs: 2202 cells/uL (ref 850–3900)
MCH: 29.9 pg (ref 27.0–33.0)
MCHC: 34.1 g/dL (ref 32.0–36.0)
MCV: 87.7 fL (ref 80.0–100.0)
MPV: 10.3 fL (ref 7.5–12.5)
Monocytes Relative: 6.4 %
Neutro Abs: 4874 cells/uL (ref 1500–7800)
Neutrophils Relative %: 63.3 %
Platelets: 444 10*3/uL — ABNORMAL HIGH (ref 140–400)
RBC: 4.62 10*6/uL (ref 3.80–5.10)
RDW: 13.5 % (ref 11.0–15.0)
Total Lymphocyte: 28.6 %
WBC: 7.7 10*3/uL (ref 3.8–10.8)

## 2019-10-30 LAB — COMPLETE METABOLIC PANEL WITH GFR
AG Ratio: 1.2 (calc) (ref 1.0–2.5)
ALT: 48 U/L — ABNORMAL HIGH (ref 6–29)
AST: 26 U/L (ref 10–35)
Albumin: 4 g/dL (ref 3.6–5.1)
Alkaline phosphatase (APISO): 100 U/L (ref 31–125)
BUN: 15 mg/dL (ref 7–25)
CO2: 28 mmol/L (ref 20–32)
Calcium: 9.6 mg/dL (ref 8.6–10.2)
Chloride: 102 mmol/L (ref 98–110)
Creat: 0.78 mg/dL (ref 0.50–1.10)
GFR, Est African American: 104 mL/min/{1.73_m2} (ref >=60)
GFR, Est Non African American: 90 mL/min/{1.73_m2} (ref >=60)
Globulin: 3.3 g/dL (calc) (ref 1.9–3.7)
Glucose, Bld: 145 mg/dL — ABNORMAL HIGH (ref 65–99)
Potassium: 3.5 mmol/L (ref 3.5–5.3)
Sodium: 139 mmol/L (ref 135–146)
Total Bilirubin: 0.3 mg/dL (ref 0.2–1.2)
Total Protein: 7.3 g/dL (ref 6.1–8.1)

## 2019-10-30 LAB — LIPID PANEL
Cholesterol: 209 mg/dL — ABNORMAL HIGH (ref <200)
HDL: 50 mg/dL (ref >=50)
LDL Cholesterol (Calc): 138 mg/dL (calc) — ABNORMAL HIGH
Non-HDL Cholesterol (Calc): 159 mg/dL (calc) — ABNORMAL HIGH (ref <130)
Total CHOL/HDL Ratio: 4.2 (calc) (ref <5.0)
Triglycerides: 106 mg/dL (ref <150)

## 2019-10-30 LAB — HEPATITIS C ANTIBODY
Hepatitis C Ab: NONREACTIVE
SIGNAL TO CUT-OFF: 0.04 (ref <1.00)

## 2019-10-30 LAB — HEMOGLOBIN A1C
Hgb A1c MFr Bld: 6.8 % of total Hgb — ABNORMAL HIGH (ref <5.7)
Mean Plasma Glucose: 148 (calc)
eAG (mmol/L): 8.2 (calc)

## 2019-10-30 LAB — MICROALBUMIN / CREATININE URINE RATIO
Creatinine, Urine: 102 mg/dL (ref 20–275)
Microalb Creat Ratio: 4 mcg/mg creat (ref <30)
Microalb, Ur: 0.4 mg/dL

## 2019-10-30 LAB — VITAMIN D 25 HYDROXY (VIT D DEFICIENCY, FRACTURES): Vit D, 25-Hydroxy: 13 ng/mL — ABNORMAL LOW (ref 30–100)

## 2019-10-30 LAB — TSH: TSH: 6.03 mIU/L — ABNORMAL HIGH

## 2019-10-31 ENCOUNTER — Other Ambulatory Visit: Payer: Self-pay | Admitting: Family Medicine

## 2019-10-31 MED ORDER — VITAMIN D (ERGOCALCIFEROL) 1.25 MG (50000 UNIT) PO CAPS: 50000.0000 [IU] | ORAL_CAPSULE | ORAL | 1 refills | Status: DC

## 2019-11-12 ENCOUNTER — Encounter: Payer: Self-pay | Admitting: Family Medicine

## 2019-11-15 ENCOUNTER — Encounter: Payer: Self-pay | Admitting: Family Medicine

## 2019-11-18 ENCOUNTER — Other Ambulatory Visit: Payer: Self-pay

## 2019-11-18 ENCOUNTER — Encounter: Payer: Self-pay | Admitting: Family Medicine

## 2019-11-18 ENCOUNTER — Ambulatory Visit (INDEPENDENT_AMBULATORY_CARE_PROVIDER_SITE_OTHER): Payer: BC Managed Care – PPO | Admitting: Family Medicine

## 2019-11-18 ENCOUNTER — Ambulatory Visit: Payer: BC Managed Care – PPO | Admitting: Family Medicine

## 2019-11-18 VITALS — BP 130/86 | HR 78 | Temp 97.7°F | Resp 16 | Ht 67.0 in | Wt 315.1 lb

## 2019-11-18 DIAGNOSIS — Z1211 Encounter for screening for malignant neoplasm of colon: Secondary | ICD-10-CM | POA: Diagnosis not present

## 2019-11-18 DIAGNOSIS — E039 Hypothyroidism, unspecified: Secondary | ICD-10-CM

## 2019-11-18 DIAGNOSIS — F330 Major depressive disorder, recurrent, mild: Secondary | ICD-10-CM | POA: Diagnosis not present

## 2019-11-18 DIAGNOSIS — G43009 Migraine without aura, not intractable, without status migrainosus: Secondary | ICD-10-CM | POA: Diagnosis not present

## 2019-11-18 MED ORDER — NORTRIPTYLINE HCL 10 MG PO CAPS: 10.0000 mg | ORAL_CAPSULE | Freq: Every day | ORAL | 0 refills | Status: DC

## 2019-11-18 MED ORDER — DULOXETINE HCL 60 MG PO CPEP: 60.0000 mg | ORAL_CAPSULE | Freq: Every day | ORAL | 0 refills | Status: DC

## 2019-11-18 MED ORDER — LEVOTHYROXINE SODIUM 25 MCG PO TABS: 25.0000 ug | ORAL_TABLET | Freq: Every day | ORAL | 0 refills | Status: DC

## 2019-11-18 NOTE — Progress Notes (Signed)
Name: Tiffany Nunez   MRN: [id number]    DOB: 27-Dec-1970   Date:11/18/2019       Progress Note  Subjective  Chief Complaint  Chief Complaint  Patient presents with  . Migraine    3-4 weeks     HPI   Migraine headaches: Migraine is described as frontal pain, throbbing like, associated with photophobia and nausea, sometimes vomiting. She described the aura as scotomas. Aimovig helpedbut it was too expensive and not sure if helped that much, Topamax causes tingling and numbness even after taking medication for month. Fioricet stopped working, Games developer caused weird taste in her month, Imitrex causes anaphylaxis. She has taken prednisone prn and helps some but does not make migraine go away. She is willing to see neurologist. We will add low dose Nortriptyline today and fill out FMLA. She has weekly migraine episodes but goes to work most of the time, she is missing work about every other week for two days when episodes are severe and associated with vomiting. No recent visits to [facility] or Urgent Care. Currently ibuprofen prn otc Last episode was about 24 hours ago, she woke up with severe migraine and had to vomiting, still has pain now 7/10.   Major Depression: she has a history of recurrent depression. She denies suicidal thoughts or ideation. We started Duloxetine about 3 weeks ago Phq 9 improved a little from 8 to 5. She states she has noticed she is feeling a little better  HTN: bp is at goal today , no chest pain or palpitation   Hypothyroidism: TSH trending up, weight gain, fatigue, mood changes we will add medication   Patient Active Problem List   Diagnosis Date Noted  . Mild episode of recurrent major depressive disorder ([place]) 05/28/2018  . History of cerebrovascular accident (CVA) involving cerebellum 06/12/2017  . Acquired autoimmune hypothyroidism 11/09/2016  . Vitamin D deficiency 05/03/2015  . Dyslipidemia 05/03/2015  . Gastro-esophageal reflux disease without esophagitis  05/03/2015  . Alopecia 05/03/2015  . Genital herpes 05/03/2015  . Dysmetabolic syndrome 99991111  . Positive H. pylori test 05/03/2015  . Neuralgia neuritis, sciatic nerve 05/03/2015  . Migraine without aura and responsive to treatment 04/30/2014  . HTN (hypertension) 01/22/2014  . Morbid obesity ([place]) 01/22/2014  . Diabetes mellitus type 2 in obese ([place]) 01/22/2014  . Decreased cardiac ejection fraction 01/22/2014    Past Surgical History:  Procedure Laterality Date  . ABDOMINAL HYSTERECTOMY    . ANKLE SURGERY     right  . TUBAL LIGATION      Family History  Problem Relation Age of Onset  . Hypertension Father   . Hyperlipidemia Father   . Hyperlipidemia Mother   . Hypertension Mother   . Hypertension Paternal Aunt   . Cancer Maternal Grandmother        breast    Social History   Tobacco Use  . Smoking status: Never Smoker  . Smokeless tobacco: Never Used  Substance Use Topics  . Alcohol use: No    Alcohol/week: 0.0 standard drinks     Current Outpatient Medications:  .  aspirin EC 81 MG tablet, Take 1 tablet (81 mg total) by mouth daily., Disp: 100 tablet, Rfl: 2 .  atorvastatin (LIPITOR) 40 MG tablet, Take 1 tablet (40 mg total) by mouth every other day., Disp: 45 tablet, Rfl: 1 .  DULoxetine (CYMBALTA) 60 MG capsule, Take 1 capsule (60 mg total) by mouth daily., Disp: 90 capsule, Rfl: 0 .  hydrALAZINE (APRESOLINE) 50  MG tablet, TAKE 1 TABLET BY MOUTH TWICE A DAY, Disp: 180 tablet, Rfl: 1 .  ibuprofen (ADVIL) 800 MG tablet, TAKE 1 TABLET BY MOUTH EVERY DAY AS NEEDED, Disp: 90 tablet, Rfl: 0 .  nadolol (CORGARD) 40 MG tablet, TAKE 1 TABLET BY MOUTH TWICE A DAY, Disp: 180 tablet, Rfl: 1 .  Olmesartan-amLODIPine-HCTZ 40-10-25 MG TABS, Take 1 tablet by mouth daily., Disp: 90 tablet, Rfl: 0 .  ondansetron (ZOFRAN-ODT) 4 MG disintegrating tablet, TAKE 1 TABLET BY MOUTH EVERY 8 HOURS AS NEEDED FOR NAUSEA AND VOMITING, Disp: 20 tablet, Rfl: 0 .  predniSONE (DELTASONE)  10 MG tablet, Take 1 tablet (10 mg total) by mouth daily with breakfast., Disp: 6 tablet, Rfl: 0 .  Vitamin D, Ergocalciferol, (DRISDOL) 1.25 MG (50000 UNIT) CAPS capsule, Take 1 capsule (50,000 Units total) by mouth every 7 (seven) days., Disp: 12 capsule, Rfl: 1 .  nortriptyline (PAMELOR) 10 MG capsule, Take 1 capsule (10 mg total) by mouth at bedtime., Disp: 90 capsule, Rfl: 0  Allergies  Allergen Reactions  . Imitrex [Sumatriptan] Anaphylaxis  . Topamax [Topiramate] Other (See Comments)    Caused hand numbness   . Ubrogepant Other (See Comments)    [name] made her feel groggy the following day and bad taste in her month     I personally reviewed active problem list, medication list, allergies, family history, social history, health maintenance with the patient/caregiver today.   ROS  Constitutional: Negative for fever or weight change.  Respiratory: Negative for cough and shortness of breath.   Cardiovascular: Negative for chest pain or palpitations.  Gastrointestinal: Negative for abdominal pain, no bowel changes.  Musculoskeletal: Negative for gait problem or joint swelling.  Skin: Negative for rash.  Neurological: Negative for dizziness, positive for intermittent  headache.  No other specific complaints in a complete review of systems (except as listed in HPI above).  Objective  Vitals:   11/18/19 0741  BP: 130/86  Pulse: 78  Resp: 16  Temp: 97.7 F (36.5 C)  TempSrc: Temporal  SpO2: 94%  Weight: (!) 315 lb 1.6 oz (142.9 kg)  Height: 5\' 7"  (1.702 m)    Body mass index is 49.35 kg/m.  Physical Exam  Constitutional: Patient appears well-developed and well-nourished. Obese  No distress.  HEENT: head atraumatic, normocephalic, pupils equal and reactive to light Cardiovascular: Normal rate, regular rhythm and normal heart sounds.  No murmur heard. No BLE edema. Pulmonary/Chest: Effort normal and breath sounds normal. No respiratory distress. Abdominal: Soft.   There is no tenderness. Neurological: no focal deficit  Psychiatric: Patient has a normal mood and affect. behavior is normal. Judgment and thought content normal.  Recent Results (from the past 2160 hour(s))  COMPLETE METABOLIC PANEL WITH GFR     Status: Abnormal   Collection Time: 10/29/19  8:47 AM  Result Value Ref Range   Glucose, Bld 145 (H) 65 - 99 mg/dL    Comment: .            Fasting reference interval . For someone without known diabetes, a glucose value >125 mg/dL indicates that they may have diabetes and this should be confirmed with a follow-up test. .    BUN 15 7 - 25 mg/dL   Creat 0.78 0.50 - 1.10 mg/dL   GFR, Est Non African American 90 > OR = 60 mL/min/1.48m2   GFR, Est African American 104 > OR = 60 mL/min/1.44m2   BUN/Creatinine Ratio NOT APPLICABLE 6 - 22 (calc)  Sodium 139 135 - 146 mmol/L   Potassium 3.5 3.5 - 5.3 mmol/L   Chloride 102 98 - 110 mmol/L   CO2 28 20 - 32 mmol/L   Calcium 9.6 8.6 - 10.2 mg/dL   Total Protein 7.3 6.1 - 8.1 g/dL   Albumin 4.0 3.6 - 5.1 g/dL   Globulin 3.3 1.9 - 3.7 g/dL (calc)   AG Ratio 1.2 1.0 - 2.5 (calc)   Total Bilirubin 0.3 0.2 - 1.2 mg/dL   Alkaline phosphatase (APISO) 100 31 - 125 U/L   AST 26 10 - 35 U/L   ALT 48 (H) 6 - 29 U/L  Hepatitis C antibody     Status: None   Collection Time: 10/29/19  8:47 AM  Result Value Ref Range   Hepatitis C Ab NON-REACTIVE NON-REACTI   SIGNAL TO CUT-OFF 0.04 <1.00    Comment: . HCV antibody was non-reactive. There is no laboratory  evidence of HCV infection. . In most cases, no further action is required. However, if recent HCV exposure is suspected, a test for HCV RNA (test code [phone]) is suggested. . For additional information please refer to http://education.[facility].com/faq/FAQ22v1 (This link is being provided for informational/ educational purposes only.) .   Hemoglobin A1c     Status: Abnormal   Collection Time: 10/29/19  8:47 AM  Result Value Ref Range    Hgb A1c MFr Bld 6.8 (H) <5.7 % of total Hgb    Comment: For someone without known diabetes, a hemoglobin A1c value of 6.5% or greater indicates that they may have  diabetes and this should be confirmed with a follow-up  test. . For someone with known diabetes, a value <7% indicates  that their diabetes is well controlled and a value  greater than or equal to 7% indicates suboptimal  control. A1c targets should be individualized based on  duration of diabetes, age, comorbid conditions, and  other considerations. . Currently, no consensus exists regarding use of hemoglobin A1c for diagnosis of diabetes for children. .    Mean Plasma Glucose 148 (calc)   eAG (mmol/L) 8.2 (calc)  VITAMIN D 25 Hydroxy (Vit-D Deficiency, Fractures)     Status: Abnormal   Collection Time: 10/29/19  8:47 AM  Result Value Ref Range   Vit D, 25-Hydroxy 13 (L) 30 - 100 ng/mL    Comment: Vitamin D Status         25-OH Vitamin D: . Deficiency:                    <20 ng/mL Insufficiency:             20 - 29 ng/mL Optimal:                 > or = 30 ng/mL . For 25-OH Vitamin D testing on patients on  D2-supplementation and patients for whom quantitation  of D2 and D3 fractions is required, the QuestAssureD(TM) 25-OH VIT D, (D2,D3), LC/MS/MS is recommended: order  code [id number] (patients >81yrs). See Note 1 . Note 1 . For additional information, please refer to  http://education.[facility].com/faq/FAQ199  (This link is being provided for informational/ educational purposes only.)   TSH     Status: Abnormal   Collection Time: 10/29/19  8:47 AM  Result Value Ref Range   TSH 6.03 (H) mIU/L    Comment:           Reference Range .           > or =  20 Years  0.40-4.50 .                Pregnancy Ranges           First trimester    0.26-2.66           Second trimester   0.55-2.73           Third trimester    0.43-2.91   Microalbumin / creatinine urine ratio     Status: None   Collection Time:  10/29/19  8:47 AM  Result Value Ref Range   Creatinine, Urine 102 20 - 275 mg/dL   Microalb, Ur 0.4 mg/dL    Comment: Reference Range Not established    Microalb Creat Ratio 4 <30 mcg/mg creat    Comment: . The ADA defines abnormalities in albumin excretion as follows: Marland Kitchen Category         Result (mcg/mg creatinine) . Normal                    <30 Microalbuminuria         30-299  Clinical albuminuria   > OR = 300 . The ADA recommends that at least two of three specimens collected within a 3-6 month period be abnormal before considering a patient to be within a diagnostic category.   Lipid panel     Status: Abnormal   Collection Time: 10/29/19  8:47 AM  Result Value Ref Range   Cholesterol 209 (H) <200 mg/dL   HDL 50 > OR = 50 mg/dL   Triglycerides 106 <150 mg/dL   LDL Cholesterol (Calc) 138 (H) mg/dL (calc)    Comment: Reference range: <100 . Desirable range <100 mg/dL for primary prevention;   <70 mg/dL for patients with CHD or diabetic patients  with > or = 2 CHD risk factors. Marland Kitchen LDL-C is now calculated using the Martin-Hopkins  calculation, which is a validated novel method providing  better accuracy than the Friedewald equation in the  estimation of LDL-C.  Cresenciano Genre et al. Annamaria Helling. [id number]): 2061-2068  (http://education.[facility].com/faq/FAQ164)    Total CHOL/HDL Ratio 4.2 <5.0 (calc)   Non-HDL Cholesterol (Calc) 159 (H) <130 mg/dL (calc)    Comment: For patients with diabetes plus 1 major ASCVD risk  factor, treating to a non-HDL-C goal of <100 mg/dL  (LDL-C of <70 mg/dL) is considered a therapeutic  option.   CBC with Differential/Platelet     Status: Abnormal   Collection Time: 10/29/19  8:47 AM  Result Value Ref Range   WBC 7.7 3.8 - 10.8 Thousand/uL   RBC 4.62 3.80 - 5.10 Million/uL   Hemoglobin 13.8 11.7 - 15.5 g/dL   HCT 40.5 35.0 - 45.0 %   MCV 87.7 80.0 - 100.0 fL   MCH 29.9 27.0 - 33.0 pg   MCHC 34.1 32.0 - 36.0 g/dL   RDW 13.5 11.0 -  15.0 %   Platelets 444 (H) 140 - 400 Thousand/uL   MPV 10.3 7.5 - 12.5 fL   Neutro Abs 4,874 1,500 - 7,800 cells/uL   Lymphs Abs 2,202 850 - 3,900 cells/uL   Absolute Monocytes 493 200 - 950 cells/uL   Eosinophils Absolute 92 15 - 500 cells/uL   Basophils Absolute 39 0 - 200 cells/uL   Neutrophils Relative % 63.3 %   Total Lymphocyte 28.6 %   Monocytes Relative 6.4 %   Eosinophils Relative 1.2 %   Basophils Relative 0.5 %     PHQ2/9: Depression screen [facility] 2/9 11/18/2019  10/29/2019 10/29/2019 09/18/2019 07/26/2019  Decreased Interest 1 1 0 0 0  Down, Depressed, Hopeless 1 2 0 0 0  PHQ - 2 Score 2 3 0 0 0  Altered sleeping 1 2 0 1 3  Tired, decreased energy 1 2 0 0 3  Change in appetite 1 0 0 0 2  Feeling bad or failure about yourself  0 0 0 0 0  Trouble concentrating 0 1 0 0 0  Moving slowly or fidgety/restless 0 0 0 0 0  Suicidal thoughts 0 0 0 0 0  PHQ-9 Score 5 8 0 1 8  Difficult doing work/chores Somewhat difficult Somewhat difficult - Not difficult at all Not difficult at all  Some recent data might be hidden    phq 9 is positive   Fall Risk: Fall Risk  11/18/2019 10/29/2019 09/18/2019 07/26/2019 03/25/2019  Falls in the past year? 0 0 0 0 -  Number falls in past yr: 0 0 0 0 0  Injury with Fall? 0 0 0 0 0  Risk for fall due to : - - - - -  Follow up - - Falls evaluation completed - -     Functional Status Survey: Is the patient deaf or have difficulty hearing?: No Does the patient have difficulty seeing, even when wearing glasses/contacts?: Yes Does the patient have difficulty concentrating, remembering, or making decisions?: No Does the patient have difficulty walking or climbing stairs?: No Does the patient have difficulty dressing or bathing?: No Does the patient have difficulty doing errands alone such as visiting a doctor's office or shopping?: No    Assessment & Plan  1. Migraine without aura and responsive to treatment  - nortriptyline (PAMELOR) 10 MG capsule;  Take 1 capsule (10 mg total) by mouth at bedtime.  Dispense: 90 capsule; Refill: 0 - Ambulatory referral to Neurology  2. Colon cancer screening  - Ambulatory referral to Gastroenterology  3. Mild episode of recurrent major depressive disorder (HCC)  - DULoxetine (CYMBALTA) 60 MG capsule; Take 1 capsule (60 mg total) by mouth daily.  Dispense: 90 capsule; Refill: 0  4. Hypothyroidism, adult  - levothyroxine (SYNTHROID) 25 MCG tablet; Take 1 tablet (25 mcg total) by mouth daily before breakfast.  Dispense: 90 tablet; Refill: 0

## 2019-11-20 ENCOUNTER — Encounter: Payer: Self-pay | Admitting: Family Medicine

## 2019-11-26 ENCOUNTER — Ambulatory Visit: Payer: BC Managed Care – PPO | Admitting: Family Medicine

## 2019-12-18 ENCOUNTER — Ambulatory Visit: Payer: BC Managed Care – PPO | Admitting: Family Medicine

## 2020-01-28 ENCOUNTER — Ambulatory Visit (INDEPENDENT_AMBULATORY_CARE_PROVIDER_SITE_OTHER): Payer: BC Managed Care – PPO | Admitting: Family Medicine

## 2020-01-28 ENCOUNTER — Encounter: Payer: Self-pay | Admitting: Family Medicine

## 2020-01-28 ENCOUNTER — Other Ambulatory Visit: Payer: Self-pay

## 2020-01-28 DIAGNOSIS — D473 Essential (hemorrhagic) thrombocythemia: Secondary | ICD-10-CM

## 2020-01-28 DIAGNOSIS — I10 Essential (primary) hypertension: Secondary | ICD-10-CM

## 2020-01-28 DIAGNOSIS — F330 Major depressive disorder, recurrent, mild: Secondary | ICD-10-CM

## 2020-01-28 DIAGNOSIS — E785 Hyperlipidemia, unspecified: Secondary | ICD-10-CM

## 2020-01-28 DIAGNOSIS — E559 Vitamin D deficiency, unspecified: Secondary | ICD-10-CM

## 2020-01-28 DIAGNOSIS — E669 Obesity, unspecified: Secondary | ICD-10-CM

## 2020-01-28 DIAGNOSIS — E039 Hypothyroidism, unspecified: Secondary | ICD-10-CM | POA: Diagnosis not present

## 2020-01-28 DIAGNOSIS — H6993 Unspecified Eustachian tube disorder, bilateral: Secondary | ICD-10-CM

## 2020-01-28 DIAGNOSIS — E1169 Type 2 diabetes mellitus with other specified complication: Secondary | ICD-10-CM

## 2020-01-28 DIAGNOSIS — G43009 Migraine without aura, not intractable, without status migrainosus: Secondary | ICD-10-CM | POA: Diagnosis not present

## 2020-01-28 DIAGNOSIS — D75839 Thrombocytosis, unspecified: Secondary | ICD-10-CM

## 2020-01-28 MED ORDER — ONDANSETRON 4 MG PO TBDP: ORAL_TABLET | ORAL | 0 refills | Status: DC

## 2020-01-28 MED ORDER — HYDRALAZINE HCL 50 MG PO TABS: 50.0000 mg | ORAL_TABLET | Freq: Two times a day (BID) | ORAL | 1 refills | Status: DC

## 2020-01-28 MED ORDER — NORTRIPTYLINE HCL 10 MG PO CAPS: 10.0000 mg | ORAL_CAPSULE | Freq: Every day | ORAL | 0 refills | Status: DC

## 2020-01-28 MED ORDER — FLUTICASONE PROPIONATE 50 MCG/ACT NA SUSP: 2.0000 | Freq: Every day | NASAL | 6 refills | Status: DC

## 2020-01-28 MED ORDER — NADOLOL 40 MG PO TABS: 40.0000 mg | ORAL_TABLET | Freq: Two times a day (BID) | ORAL | 1 refills | Status: DC

## 2020-01-28 MED ORDER — DULOXETINE HCL 60 MG PO CPEP: 60.0000 mg | ORAL_CAPSULE | Freq: Every day | ORAL | 1 refills | Status: DC

## 2020-01-28 MED ORDER — OLMESARTAN-AMLODIPINE-HCTZ 40-10-25 MG PO TABS: 1.0000 | ORAL_TABLET | Freq: Every day | ORAL | 1 refills | Status: DC

## 2020-01-28 NOTE — Progress Notes (Signed)
Name: Tiffany Nunez   MRN: [id number]    DOB: 12-Jul-1971   Date:01/28/2020       Progress Note  Subjective  Chief Complaint  Chief Complaint  Patient presents with  . Diabetes    follow up, medication refills  . Headache  . Hypertension  . Hyperlipidemia  . Hypothyroidism    I connected with  [name] on 01/28/20 at  8:20 AM EDT by telephone and verified that I am speaking with the correct person using two identifiers.  I discussed the limitations, risks, security and privacy concerns of performing an evaluation and management service by telephone and the availability of in person appointments. Staff also discussed with the patient that there may be a patient responsible charge related to this service. Patient Location: at home  Provider Location: [facility]   HPI  DMII :hgbA1Clast visit was6.8%.We stopped Metformin in 2017 because of diarrhea. Shewason Trulicity since Fall [id number] tolerating well, but got tired of giving herself injections weekly and also because of the cost of medication. She denies polyphagia but has noticed some polydipsia and polyuria lately  She has not been compliant with her diet.  Migraine headaches: Migraine is described as frontal pain, throbbing like, associated with photophobia and nausea, sometimes vomiting. She described the aura as scotomas. Aimovig helped a little but it was too expensive. Sheis taking fioricet but does not seem to be helping as much. She tried topamax but could not tolerate paresthesia as a side effect We gave her [name] and she states it made her feel " horrible " caused lack of energy, dry mouth and still did not control symptoms. We  also added Nortriptyline one month ago and filled FMLA forms since she was having migraine episodes multiple times a week and missing work every other week for about two days. She states episodes are not any better with medication. Explained importance of seeing neurologist    Major Depression: she has a history of recurrent depression. She denies suicidal thoughts or ideation, she states since she started Duloxetine in March 2021 she feels like she is back to her normal self. Not having anhedonia, mood is better.   Hypothyroidism:she stopped seeing [name] and has been out of Synthroid for months.Last TSHwas elevated , we re-started medication March 2021 and is due for repeat labs   HTN:bp not checked at home today , we will continue current dose of 40/10/25 an Nadolol , she denies chest pain or palpitation  Morbid obesity:she has not been checking her weight at home lately, she is not eating a healthy diet   History of CVA:She states she has occasional episodes of dizziness, but otherwise not problems.She forgot to resume the baby aspirin   Ulnar nerve entrapment: seeing [facility], had NCS, but she cancelled her appointment   Eustachian tube dysfunction: noticing while driving to work, we will start nasal spray twice daily and monitor  Patient Active Problem List   Diagnosis Date Noted  . Mild episode of recurrent major depressive disorder ([place]) 05/28/2018  . History of cerebrovascular accident (CVA) involving cerebellum 06/12/2017  . Acquired autoimmune hypothyroidism 11/09/2016  . Vitamin D deficiency 05/03/2015  . Dyslipidemia 05/03/2015  . Gastro-esophageal reflux disease without esophagitis 05/03/2015  . Alopecia 05/03/2015  . Genital herpes 05/03/2015  . Dysmetabolic syndrome [id number]  . Positive H. pylori test 05/03/2015  . Neuralgia neuritis, sciatic nerve 05/03/2015  . Migraine without aura and responsive to treatment 04/30/2014  . HTN (hypertension)  01/22/2014  . Morbid obesity ([place]) 01/22/2014  . Diabetes mellitus type 2 in obese ([place]) 01/22/2014  . Decreased cardiac ejection fraction 01/22/2014    Past Surgical History:  Procedure Laterality Date  . ABDOMINAL HYSTERECTOMY    . ANKLE SURGERY     right   . TUBAL LIGATION      Family History  Problem Relation Age of Onset  . Hypertension Father   . Hyperlipidemia Father   . Hyperlipidemia Mother   . Hypertension Mother   . Hypertension Paternal Aunt   . Cancer Maternal Grandmother        breast    Social History   Socioeconomic History  . Marital status: Divorced    Spouse name: Not on file  . Number of children: 3  . Years of education: Not on file  . Highest education level: Associate degree: occupational, Hotel manager, or vocational program  Occupational History    Comment: [employer]  Tobacco Use  . Smoking status: Never Smoker  . Smokeless tobacco: Never Used  Substance and Sexual Activity  . Alcohol use: No    Alcohol/week: 0.0 standard drinks  . Drug use: No  . Sexual activity: Not on file  Other Topics Concern  . Not on file  Social History Narrative  . Not on file   Social Determinants of Health   Financial Resource Strain: Medium Risk  . Difficulty of Paying Living Expenses: Somewhat hard  Food Insecurity: Food Insecurity Present  . Worried About Charity fundraiser in the Last Year: Sometimes true  . Ran Out of Food in the Last Year: Sometimes true  Transportation Needs: No Transportation Needs  . Lack of Transportation (Medical): No  . Lack of Transportation (Non-Medical): No  Physical Activity: Inactive  . Days of Exercise per Week: 0 days  . Minutes of Exercise per Session: 0 min  Stress: Stress Concern Present  . Feeling of Stress : Very much  Social Connections: Somewhat Isolated  . Frequency of Communication with Friends and Family: More than three times a week  . Frequency of Social Gatherings with Friends and Family: Once a week  . Attends Religious Services: 1 to 4 times per year  . Active Member of Clubs or Organizations: No  . Attends Archivist Meetings: Never  . Marital Status: Divorced  Human resources officer Violence: Not At Risk  . Fear of Current or Ex-Partner: No  .  Emotionally Abused: No  . Physically Abused: No  . Sexually Abused: No     Current Outpatient Medications:  .  aspirin EC 81 MG tablet, Take 1 tablet (81 mg total) by mouth daily., Disp: 100 tablet, Rfl: 2 .  atorvastatin (LIPITOR) 40 MG tablet, Take 1 tablet (40 mg total) by mouth every other day., Disp: 45 tablet, Rfl: 1 .  DULoxetine (CYMBALTA) 60 MG capsule, Take 1 capsule (60 mg total) by mouth daily., Disp: 90 capsule, Rfl: 0 .  hydrALAZINE (APRESOLINE) 50 MG tablet, TAKE 1 TABLET BY MOUTH TWICE A DAY, Disp: 180 tablet, Rfl: 1 .  ibuprofen (ADVIL) 800 MG tablet, TAKE 1 TABLET BY MOUTH EVERY DAY AS NEEDED, Disp: 90 tablet, Rfl: 0 .  levothyroxine (SYNTHROID) 25 MCG tablet, Take 1 tablet (25 mcg total) by mouth daily before breakfast., Disp: 90 tablet, Rfl: 0 .  nadolol (CORGARD) 40 MG tablet, TAKE 1 TABLET BY MOUTH TWICE A DAY, Disp: 180 tablet, Rfl: 1 .  nortriptyline (PAMELOR) 10 MG capsule, Take 1 capsule (10 mg  total) by mouth at bedtime., Disp: 90 capsule, Rfl: 0 .  Olmesartan-amLODIPine-HCTZ 40-10-25 MG TABS, Take 1 tablet by mouth daily., Disp: 90 tablet, Rfl: 0 .  ondansetron (ZOFRAN-ODT) 4 MG disintegrating tablet, TAKE 1 TABLET BY MOUTH EVERY 8 HOURS AS NEEDED FOR NAUSEA AND VOMITING, Disp: 20 tablet, Rfl: 0 .  predniSONE (DELTASONE) 10 MG tablet, Take 1 tablet (10 mg total) by mouth daily with breakfast., Disp: 6 tablet, Rfl: 0 .  Vitamin D, Ergocalciferol, (DRISDOL) 1.25 MG (50000 UNIT) CAPS capsule, Take 1 capsule (50,000 Units total) by mouth every 7 (seven) days., Disp: 12 capsule, Rfl: 1  Allergies  Allergen Reactions  . Imitrex [Sumatriptan] Anaphylaxis  . Topamax [Topiramate] Other (See Comments)    Caused hand numbness   . Ubrogepant Other (See Comments)    [name] made her feel groggy the following day and bad taste in her month     I personally reviewed active problem list, medication list, allergies, family history, social history, health maintenance with the  patient/caregiver today.   ROS  Constitutional: Negative for fever or weight change.  Respiratory: Negative for cough and shortness of breath.   Cardiovascular: Negative for chest pain or palpitations.  Gastrointestinal: Negative for abdominal pain, no bowel changes.  Musculoskeletal: Negative for gait problem or joint swelling.  Skin: Negative for rash.  Neurological: Negative for dizziness , positive for intermittent headache.  No other specific complaints in a complete review of systems (except as listed in HPI above).  Objective  Virtual encounter, vitals not obtained.  There is no height or weight on file to calculate BMI.  Physical Exam  Awake, alert and oriented   PHQ2/9: Depression screen [facility] 2/9 01/28/2020 11/18/2019 10/29/2019 10/29/2019 09/18/2019  Decreased Interest 0 1 1 0 0  Down, Depressed, Hopeless 0 1 2 0 0  PHQ - 2 Score 0 2 3 0 0  Altered sleeping 1 1 2  0 1  Tired, decreased energy 1 1 2  0 0  Change in appetite 0 1 0 0 0  Feeling bad or failure about yourself  0 0 0 0 0  Trouble concentrating 0 0 1 0 0  Moving slowly or fidgety/restless 0 0 0 0 0  Suicidal thoughts 0 0 0 0 0  PHQ-9 Score 2 5 8  0 1  Difficult doing work/chores - Somewhat difficult Somewhat difficult - Not difficult at all  Some recent data might be hidden   PHQ-2/9 Result is positive.    Fall Risk: Fall Risk  01/28/2020 11/18/2019 10/29/2019 09/18/2019 07/26/2019  Falls in the past year? 0 0 0 0 0  Number falls in past yr: 0 0 0 0 0  Injury with Fall? 0 0 0 0 0  Risk for fall due to : - - - - -  Follow up - - - Falls evaluation completed -     Assessment & Plan  1. Hypothyroidism, adult  - TSH  2. Diabetes mellitus type 2 in obese (HCC)  - Hemoglobin A1c  3. Migraine without aura and responsive to treatment  - nortriptyline (PAMELOR) 10 MG capsule; Take 1 capsule (10 mg total) by mouth at bedtime.  Dispense: 90 capsule; Refill: 0 - ondansetron (ZOFRAN-ODT) 4 MG disintegrating  tablet; TAKE 1 TABLET BY MOUTH EVERY 8 HOURS AS NEEDED FOR NAUSEA AND VOMITING  Dispense: 20 tablet; Refill: 0  She needs to call neurologist at  [phone]  4. Essential hypertension  - nadolol (CORGARD) 40 MG tablet; Take 1 tablet (40  mg total) by mouth 2 (two) times daily.  Dispense: 180 tablet; Refill: 1 - hydrALAZINE (APRESOLINE) 50 MG tablet; Take 1 tablet (50 mg total) by mouth 2 (two) times daily.  Dispense: 180 tablet; Refill: 1 - Olmesartan-amLODIPine-HCTZ 40-10-25 MG TABS; Take 1 tablet by mouth daily.  Dispense: 90 tablet; Refill: 1 - COMPLETE METABOLIC PANEL WITH GFR  5. Vitamin D deficiency  Continue supplementation   6. Dyslipidemia  - Lipid panel  7. Morbid obesity ([place])  Discussed with the patient the risk posed by an increased BMI. Discussed importance of portion control, calorie counting and at least 150 minutes of physical activity weekly. Avoid sweet beverages and drink more water. Eat at least 6 servings of fruit and vegetables daily   8. Mild episode of recurrent major depressive disorder (HCC)  - DULoxetine (CYMBALTA) 60 MG capsule; Take 1 capsule (60 mg total) by mouth daily.  Dispense: 90 capsule; Refill: 1  9. Thrombocytosis ([place])  - CBC with Differential/Platelet  I discussed the assessment and treatment plan with the patient. The patient was provided an opportunity to ask questions and all were answered. The patient agreed with the plan and demonstrated an understanding of the instructions.   The patient was advised to call back or seek an in-person evaluation if the symptoms worsen or if the condition fails to improve as anticipated.  I provided 25  minutes of non-face-to-face time during this encounter.  Loistine Chance, MD

## 2020-03-31 ENCOUNTER — Encounter: Payer: Self-pay | Admitting: Family Medicine

## 2020-04-01 ENCOUNTER — Other Ambulatory Visit: Payer: Self-pay | Admitting: Family Medicine

## 2020-04-01 DIAGNOSIS — G43009 Migraine without aura, not intractable, without status migrainosus: Secondary | ICD-10-CM

## 2020-04-02 ENCOUNTER — Encounter: Payer: Self-pay | Admitting: Neurology

## 2020-04-14 ENCOUNTER — Encounter: Payer: Self-pay | Admitting: Family Medicine

## 2020-04-29 ENCOUNTER — Other Ambulatory Visit: Payer: Self-pay | Admitting: Family Medicine

## 2020-04-29 DIAGNOSIS — G43009 Migraine without aura, not intractable, without status migrainosus: Secondary | ICD-10-CM

## 2020-04-29 NOTE — Telephone Encounter (Signed)
Requested Prescriptions  Pending Prescriptions Disp Refills   ibuprofen (ADVIL) 800 MG tablet [Pharmacy Med Name: IBUPROFEN 800 MG TABLET] 90 tablet 0    Sig: TAKE 1 TABLET BY MOUTH EVERY DAY AS NEEDED     Analgesics:  NSAIDS Passed - 04/29/2020  1:53 AM      Passed - Cr in normal range and within 360 days    Creat  Date Value Ref Range Status  10/29/2019 0.78 0.50 - 1.10 mg/dL Final   Creatinine, Urine  Date Value Ref Range Status  10/29/2019 102 20 - 275 mg/dL Final         Passed - HGB in normal range and within 360 days    Hemoglobin  Date Value Ref Range Status  10/29/2019 13.8 11.7 - 15.5 g/dL Final   HGB  Date Value Ref Range Status  07/23/2014 14.6 12.0 - 16.0 g/dL Final         Passed - Patient is not pregnant      Passed - Valid encounter within last 12 months    Recent Outpatient Visits          3 months ago Hypothyroidism, adult   [facility], Drue Stager, MD   5 months ago Migraine without aura and responsive to treatment   [facility] Steele Sizer, MD   6 months ago Diabetes mellitus type 2 in obese [facility])   [facility] Steele Sizer, MD   7 months ago Medial epicondylitis of elbow, left   [place], FNP   9 months ago Morbid obesity [facility])   [facility] Steele Sizer, MD      Future Appointments            In 1 month Ancil Boozer, Drue Stager, MD [facility] [place]   In 2 months Pieter Partridge, [facility] [id number] [facility]

## 2020-05-07 ENCOUNTER — Other Ambulatory Visit: Payer: Self-pay | Admitting: Family Medicine

## 2020-05-07 DIAGNOSIS — G43009 Migraine without aura, not intractable, without status migrainosus: Secondary | ICD-10-CM

## 2020-05-07 NOTE — Telephone Encounter (Signed)
Requested Prescriptions  Pending Prescriptions Disp Refills   nortriptyline (PAMELOR) 10 MG capsule [Pharmacy Med Name: NORTRIPTYLINE HCL 10 MG CAP] 90 capsule 0    Sig: TAKE 1 CAPSULE BY MOUTH EVERYDAY AT BEDTIME     Psychiatry:  Antidepressants - Heterocyclics (TCAs) Passed - 05/07/2020  1:25 AM      Passed - Completed PHQ-2 or PHQ-9 in the last 360 days.      Passed - Valid encounter within last 6 months    Recent Outpatient Visits          3 months ago Hypothyroidism, adult   [facility], Drue Stager, MD   5 months ago Migraine without aura and responsive to treatment   [facility] Steele Sizer, MD   6 months ago Diabetes mellitus type 2 in obese [facility])   [facility] Steele Sizer, MD   7 months ago Medial epicondylitis of elbow, left   [place], FNP   9 months ago Morbid obesity [facility])   [facility] Steele Sizer, MD      Future Appointments            In 3 weeks Steele Sizer, MD [facility] [place]   In 1 month Pieter Partridge, DO [facility]

## 2020-06-01 NOTE — Progress Notes (Signed)
Name: Tiffany Nunez   MRN: [id number]    DOB: December 10, 1970   Date:06/02/2020       Progress Note  Subjective  Chief Complaint  Chief Complaint  Patient presents with  . Diabetes  . Depression  . Hypothyroidism  . Obesity  . Hypertension  . Hyperlipidemia  . Fatigue  . Emesis    HPI  DMII :hgbA1Clast visit was6.8%, she has lab work done on 03/2020 and it was 8 % .We stopped Metformin in 2017 because of diarrhea. Shewason Trulicity since Fall [id number] tolerating well, but got tired of giving herself injections weekly andalso because of the cost of medication, and glucose had been at goal. She denies polyphagia , but has been very thirsty lately. She is not on any medications. Today A1C is 10.6 , she states she has not been eating much, but drinks a lot of ginger ale and punch juice   Migraine headaches: Migraine is described as frontal pain, throbbing like, associated with photophobia and nausea, sometimes vomiting. She described the aura as scotomas. Aimovig helpeda littlebut it was too expensive. Sheis taking fioricet but does not seem to be helping as much. She tried topamax but could not tolerate paresthesia as a side effect We gave her [name] and she states it made her feel " horrible " caused lack of energy, dry mouth and still did not control symptoms. We  also added Nortriptyline , but episodes still a couple of times a week that she can manage with ibuprofen, but once a month she has a severe episode that may last 5 days and she cannot work or function. She has an appointment with Neurologist coming up in October.   Major Depression: she has a history of recurrent depression. She denies suicidal thoughts or ideation, she states since she started Duloxetine in March 2021 she feels like she is back to her normal self. She states not feeling well physically and is causing her to feel tired, but in general she feels Duloxetine is helping   Hypothyroidism:she stopped  seeing [name] and has been out of Synthroid for months.Last [facility] , we re-started medication March 2021 however she ran out of rx and has been out of medication again, we will recheck level before resuming it   HTN:bp is at goal  , we willcontinue current dose of40/10/25an Nadolol, she denies chest pain or palpitationDenies dizziness   Morbid obesity:she lost 10 lbs since last visit, she states not eating much because of N/V  History of CVA:She states she has occasional episodes of dizziness, but otherwise not problems.She forgot to resume the baby aspirin and is also not taking atorvastatin, advised her to do that   Eustachian tube dysfunction: she states medication helped but continues to have intermittent symptoms and hearing goes in an out, feels under water, explained we can add Astelin or refer to ENT   N/V abdominal pain: she has noticed intermittent upper abdominal pain , increase in reflux symptoms over the past couple of months, however this past weekend she felt very drained, followed by nausea, and after she ate lunch on Saturday she vomited. Smell of food has been making her sick for the past few days. Increase in indigestion and some right upper quadrant pain. She had 4 episodes of bowel movements yesterday, she is usually constipated, no blood in stools. Stools is soft.    Patient Active Problem List   Diagnosis Date Noted  . Mild episode of recurrent major depressive disorder ([place]) 05/28/2018  .  History of cerebrovascular accident (CVA) involving cerebellum 06/12/2017  . Acquired autoimmune hypothyroidism 11/09/2016  . Vitamin D deficiency 05/03/2015  . Dyslipidemia 05/03/2015  . Gastro-esophageal reflux disease without esophagitis 05/03/2015  . Alopecia 05/03/2015  . Genital herpes 05/03/2015  . Dysmetabolic syndrome 56/38/9373  . Positive H. pylori test 05/03/2015  . Neuralgia neuritis, sciatic nerve 05/03/2015  . Migraine without aura and  responsive to treatment 04/30/2014  . HTN (hypertension) 01/22/2014  . Morbid obesity ([place]) 01/22/2014  . Diabetes mellitus type 2 in obese ([place]) 01/22/2014  . Decreased cardiac ejection fraction 01/22/2014    Past Surgical History:  Procedure Laterality Date  . ABDOMINAL HYSTERECTOMY    . ANKLE SURGERY     right  . TUBAL LIGATION      Family History  Problem Relation Age of Onset  . Hypertension Father   . Hyperlipidemia Father   . Hyperlipidemia Mother   . Hypertension Mother   . Hypertension Paternal Aunt   . Cancer Maternal Grandmother        breast    Social History   Tobacco Use  . Smoking status: Never Smoker  . Smokeless tobacco: Never Used  Substance Use Topics  . Alcohol use: No    Alcohol/week: 0.0 standard drinks     Current Outpatient Medications:  .  DULoxetine (CYMBALTA) 60 MG capsule, Take 1 capsule (60 mg total) by mouth daily., Disp: 90 capsule, Rfl: 1 .  fluticasone (FLONASE) 50 MCG/ACT nasal spray, Place 2 sprays into both nostrils daily., Disp: 16 g, Rfl: 6 .  hydrALAZINE (APRESOLINE) 50 MG tablet, Take 1 tablet (50 mg total) by mouth 2 (two) times daily., Disp: 180 tablet, Rfl: 1 .  ibuprofen (ADVIL) 800 MG tablet, TAKE 1 TABLET BY MOUTH EVERY DAY AS NEEDED, Disp: 90 tablet, Rfl: 0 .  nadolol (CORGARD) 40 MG tablet, Take 1 tablet (40 mg total) by mouth 2 (two) times daily., Disp: 180 tablet, Rfl: 1 .  nortriptyline (PAMELOR) 10 MG capsule, TAKE 1 CAPSULE BY MOUTH EVERYDAY AT BEDTIME, Disp: 90 capsule, Rfl: 0 .  Olmesartan-amLODIPine-HCTZ 40-10-25 MG TABS, Take 1 tablet by mouth daily., Disp: 90 tablet, Rfl: 1 .  ondansetron (ZOFRAN-ODT) 4 MG disintegrating tablet, TAKE 1 TABLET BY MOUTH EVERY 8 HOURS AS NEEDED FOR NAUSEA AND VOMITING, Disp: 20 tablet, Rfl: 0 .  promethazine (PHENERGAN) 12.5 MG tablet, Take by mouth as directed., Disp: , Rfl:  .  Vitamin D, Ergocalciferol, (DRISDOL) 1.25 MG (50000 UNIT) CAPS capsule, Take 1 capsule (50,000 Units  total) by mouth every 7 (seven) days., Disp: 12 capsule, Rfl: 1 .  aspirin EC 81 MG tablet, Take 1 tablet (81 mg total) by mouth daily. (Patient not taking: Reported on 06/02/2020), Disp: 100 tablet, Rfl: 2 .  atorvastatin (LIPITOR) 40 MG tablet, Take 1 tablet (40 mg total) by mouth every other day. (Patient not taking: Reported on 06/02/2020), Disp: 45 tablet, Rfl: 1 .  levothyroxine (SYNTHROID) 25 MCG tablet, Take 1 tablet (25 mcg total) by mouth daily before breakfast. (Patient not taking: Reported on 06/02/2020), Disp: 90 tablet, Rfl: 0 .  predniSONE (DELTASONE) 10 MG tablet, Take 1 tablet (10 mg total) by mouth daily with breakfast. (Patient not taking: Reported on 06/02/2020), Disp: 6 tablet, Rfl: 0  Allergies  Allergen Reactions  . Imitrex [Sumatriptan] Anaphylaxis  . Topamax [Topiramate] Other (See Comments)    Caused hand numbness   . Ubrogepant Other (See Comments)    [name] made her feel groggy the following day  and bad taste in her month     I personally reviewed active problem list, medication list, allergies, family history, social history, health maintenance with the patient/caregiver today.   ROS  Constitutional: Negative for fever , positive for  weight change - lost 10 lbs.  Respiratory: Negative for cough and shortness of breath.   Cardiovascular: Negative for chest pain , positive for intermittent palpitations.  Gastrointestinal: positive  for abdominal pain and  bowel changes.  Musculoskeletal: Negative for gait problem or joint swelling.  Skin: Negative for rash.  Neurological: Negative for dizziness , positive for intermittent  headache.  No other specific complaints in a complete review of systems (except as listed in HPI above).  Objective  Vitals:   06/02/20 0747  BP: 110/80  Pulse: 93  Resp: 16  Temp: 98.2 F (36.8 C)  TempSrc: Oral  SpO2: 99%  Weight: (!) 305 lb 6.4 oz (138.5 kg)  Height: 5\' 7"  (1.702 m)    Body mass index is 47.83  kg/m.  Physical Exam  Constitutional: Patient appears well-developed and well-nourished. Obese  No distress.  HEENT: head atraumatic, normocephalic, pupils equal and reactive to light,neck supple Cardiovascular: Normal rate, regular rhythm and normal heart sounds.  No murmur heard. No BLE edema. Pulmonary/Chest: Effort normal and breath sounds normal. No respiratory distress. Abdominal: Soft.  There is mild epigastric discomfort  Psychiatric: Patient has a normal mood and affect. behavior is normal. Judgment and thought content normal.    PHQ2/9: Depression screen [facility] 2/9 06/02/2020 01/28/2020 11/18/2019 10/29/2019 10/29/2019  Decreased Interest 1 0 1 1 0  Down, Depressed, Hopeless 1 0 1 2 0  PHQ - 2 Score 2 0 2 3 0  Altered sleeping 1 1 1 2  0  Tired, decreased energy 2 1 1 2  0  Change in appetite 2 0 1 0 0  Feeling bad or failure about yourself  0 0 0 0 0  Trouble concentrating 0 0 0 1 0  Moving slowly or fidgety/restless 0 0 0 0 0  Suicidal thoughts 0 0 0 0 0  PHQ-9 Score 7 2 5 8  0  Difficult doing work/chores Somewhat difficult - Somewhat difficult Somewhat difficult -  Some recent data might be hidden    phq 9 is positive  Fall Risk: Fall Risk  06/02/2020 01/28/2020 11/18/2019 10/29/2019 09/18/2019  Falls in the past year? 0 0 0 0 0  Number falls in past yr: 0 0 0 0 0  Injury with Fall? 0 0 0 0 0  Risk for fall due to : - - - - -  Follow up - - - - Falls evaluation completed     Functional Status Survey: Is the patient deaf or have difficulty hearing?: Yes Does the patient have difficulty seeing, even when wearing glasses/contacts?: Yes Does the patient have difficulty concentrating, remembering, or making decisions?: No Does the patient have difficulty walking or climbing stairs?: No Does the patient have difficulty dressing or bathing?: No Does the patient have difficulty doing errands alone such as visiting a doctor's office or shopping?: No    Assessment & Plan  1.  Diabetes mellitus type 2 in obese (HCC)  - POCT HgB A1C  2. Morbid obesity ([place])  Discussed with the patient the risk posed by an increased BMI. Discussed importance of portion control, calorie counting and at least 150 minutes of physical activity weekly. Avoid sweet beverages and drink more water. Eat at least 6 servings of fruit and vegetables daily  3. Dyslipidemia  Reminded her to resume medication   4. Hypothyroidism, adult  - TSH  5. Essential hypertension  - nadolol (CORGARD) 40 MG tablet; Take 1 tablet (40 mg total) by mouth 2 (two) times daily.  Dispense: 180 tablet; Refill: 1 - Olmesartan-amLODIPine-HCTZ 40-10-25 MG TABS; Take 1 tablet by mouth daily.  Dispense: 90 tablet; Refill: 1  6. Migraine with aura, intractable, without status migrainosus  Waiting to see neurologist   7. Vitamin D deficiency  - Vitamin D, Ergocalciferol, (DRISDOL) 1.25 MG (50000 UNIT) CAPS capsule; Take 1 capsule (50,000 Units total) by mouth every 7 (seven) days.  Dispense: 12 capsule; Refill: 1  8. Mild episode of recurrent major depressive disorder (HCC)  - DULoxetine (CYMBALTA) 60 MG capsule; Take 1 capsule (60 mg total) by mouth daily.  Dispense: 90 capsule; Refill: 1  9. Non-intractable vomiting with nausea, unspecified vomiting type  - US Abdomen Limited RUQ; Future  10. Intermittent abdominal pain  - US Abdomen Limited RUQ; Future - COMPLETE METABOLIC PANEL WITH GFR - CBC with Differential/Platelet  11. Elevated liver enzymes  - US Abdomen Limited RUQ; Future  12. Migraine without aura and responsive to treatment  - nortriptyline (PAMELOR) 10 MG capsule; TAKE 1 CAPSULE BY MOUTH EVERYDAY AT BEDTIME  Dispense: 90 capsule; Refill: 0 - ondansetron (ZOFRAN-ODT) 4 MG disintegrating tablet; TAKE 1 TABLET BY MOUTH EVERY 8 HOURS AS NEEDED FOR NAUSEA AND VOMITING  Dispense: 20 tablet; Refill: 0   13. History of Helicobacter pylori infection  - H. pylori breath test

## 2020-06-02 ENCOUNTER — Other Ambulatory Visit: Payer: Self-pay

## 2020-06-02 ENCOUNTER — Encounter: Payer: Self-pay | Admitting: Family Medicine

## 2020-06-02 ENCOUNTER — Ambulatory Visit (INDEPENDENT_AMBULATORY_CARE_PROVIDER_SITE_OTHER): Payer: BC Managed Care – PPO | Admitting: Family Medicine

## 2020-06-02 VITALS — BP 110/80 | HR 93 | Temp 98.2°F | Resp 16 | Ht 67.0 in | Wt 305.4 lb

## 2020-06-02 DIAGNOSIS — E669 Obesity, unspecified: Secondary | ICD-10-CM

## 2020-06-02 DIAGNOSIS — G43009 Migraine without aura, not intractable, without status migrainosus: Secondary | ICD-10-CM

## 2020-06-02 DIAGNOSIS — F330 Major depressive disorder, recurrent, mild: Secondary | ICD-10-CM

## 2020-06-02 DIAGNOSIS — Z8619 Personal history of other infectious and parasitic diseases: Secondary | ICD-10-CM

## 2020-06-02 DIAGNOSIS — R112 Nausea with vomiting, unspecified: Secondary | ICD-10-CM

## 2020-06-02 DIAGNOSIS — E1169 Type 2 diabetes mellitus with other specified complication: Secondary | ICD-10-CM

## 2020-06-02 DIAGNOSIS — E559 Vitamin D deficiency, unspecified: Secondary | ICD-10-CM

## 2020-06-02 DIAGNOSIS — E039 Hypothyroidism, unspecified: Secondary | ICD-10-CM | POA: Diagnosis not present

## 2020-06-02 DIAGNOSIS — E785 Hyperlipidemia, unspecified: Secondary | ICD-10-CM | POA: Diagnosis not present

## 2020-06-02 DIAGNOSIS — G43119 Migraine with aura, intractable, without status migrainosus: Secondary | ICD-10-CM

## 2020-06-02 DIAGNOSIS — I10 Essential (primary) hypertension: Secondary | ICD-10-CM

## 2020-06-02 DIAGNOSIS — R748 Abnormal levels of other serum enzymes: Secondary | ICD-10-CM

## 2020-06-02 DIAGNOSIS — H6993 Unspecified Eustachian tube disorder, bilateral: Secondary | ICD-10-CM

## 2020-06-02 DIAGNOSIS — R109 Unspecified abdominal pain: Secondary | ICD-10-CM

## 2020-06-02 LAB — POCT GLYCOSYLATED HEMOGLOBIN (HGB A1C): Hemoglobin A1C: 10.6 % — AB (ref 4.0–5.6)

## 2020-06-02 MED ORDER — NORTRIPTYLINE HCL 10 MG PO CAPS: ORAL_CAPSULE | ORAL | 0 refills | Status: DC

## 2020-06-02 MED ORDER — TRESIBA FLEXTOUCH 100 UNIT/ML ~~LOC~~ SOPN: 16.0000 [IU] | PEN_INJECTOR | Freq: Every day | SUBCUTANEOUS | 0 refills | Status: DC

## 2020-06-02 MED ORDER — OLMESARTAN-AMLODIPINE-HCTZ 40-10-25 MG PO TABS: 1.0000 | ORAL_TABLET | Freq: Every day | ORAL | 1 refills | Status: DC

## 2020-06-02 MED ORDER — VITAMIN D (ERGOCALCIFEROL) 1.25 MG (50000 UNIT) PO CAPS: 50000.0000 [IU] | ORAL_CAPSULE | ORAL | 1 refills | Status: DC

## 2020-06-02 MED ORDER — ONDANSETRON 4 MG PO TBDP: ORAL_TABLET | ORAL | 0 refills | Status: DC

## 2020-06-02 MED ORDER — DAPAGLIFLOZIN PROPANEDIOL 10 MG PO TABS: 10.0000 mg | ORAL_TABLET | Freq: Every day | ORAL | 0 refills | Status: DC

## 2020-06-02 MED ORDER — DULOXETINE HCL 60 MG PO CPEP: 60.0000 mg | ORAL_CAPSULE | Freq: Every day | ORAL | 1 refills | Status: DC

## 2020-06-02 MED ORDER — BLOOD GLUCOSE METER KIT: PACK | 0 refills | Status: DC

## 2020-06-02 MED ORDER — NADOLOL 40 MG PO TABS: 40.0000 mg | ORAL_TABLET | Freq: Two times a day (BID) | ORAL | 1 refills | Status: DC

## 2020-06-02 MED ORDER — INSULIN PEN NEEDLE 30G X 8 MM MISC: 1.0000 | 1 refills | Status: DC | PRN

## 2020-06-03 ENCOUNTER — Other Ambulatory Visit: Payer: Self-pay | Admitting: Family Medicine

## 2020-06-03 DIAGNOSIS — E039 Hypothyroidism, unspecified: Secondary | ICD-10-CM

## 2020-06-03 LAB — COMPLETE METABOLIC PANEL WITH GFR
AG Ratio: 1.1 (calc) (ref 1.0–2.5)
ALT: 238 U/L — ABNORMAL HIGH (ref 6–29)
AST: 86 U/L — ABNORMAL HIGH (ref 10–35)
Albumin: 4.2 g/dL (ref 3.6–5.1)
Alkaline phosphatase (APISO): 141 U/L — ABNORMAL HIGH (ref 31–125)
BUN/Creatinine Ratio: 12 (calc) (ref 6–22)
BUN: 14 mg/dL (ref 7–25)
CO2: 27 mmol/L (ref 20–32)
Calcium: 10.2 mg/dL (ref 8.6–10.2)
Chloride: 93 mmol/L — ABNORMAL LOW (ref 98–110)
Creat: 1.18 mg/dL — ABNORMAL HIGH (ref 0.50–1.10)
GFR, Est African American: 63 mL/min/{1.73_m2} (ref >=60)
GFR, Est Non African American: 54 mL/min/{1.73_m2} — ABNORMAL LOW (ref >=60)
Globulin: 3.7 g/dL (calc) (ref 1.9–3.7)
Glucose, Bld: 539 mg/dL (ref 65–99)
Potassium: 4.1 mmol/L (ref 3.5–5.3)
Sodium: 132 mmol/L — ABNORMAL LOW (ref 135–146)
Total Bilirubin: 0.6 mg/dL (ref 0.2–1.2)
Total Protein: 7.9 g/dL (ref 6.1–8.1)

## 2020-06-03 LAB — CBC WITH DIFFERENTIAL/PLATELET
Absolute Monocytes: 530 cells/uL (ref 200–950)
Basophils Absolute: 47 cells/uL (ref 0–200)
Basophils Relative: 0.5 %
Eosinophils Absolute: 93 cells/uL (ref 15–500)
Eosinophils Relative: 1 %
HCT: 46.9 % — ABNORMAL HIGH (ref 35.0–45.0)
Hemoglobin: 15.7 g/dL — ABNORMAL HIGH (ref 11.7–15.5)
Lymphs Abs: 3329 cells/uL (ref 850–3900)
MCH: 30.3 pg (ref 27.0–33.0)
MCHC: 33.5 g/dL (ref 32.0–36.0)
MCV: 90.4 fL (ref 80.0–100.0)
MPV: 11.8 fL (ref 7.5–12.5)
Monocytes Relative: 5.7 %
Neutro Abs: 5301 cells/uL (ref 1500–7800)
Neutrophils Relative %: 57 %
Platelets: 442 10*3/uL — ABNORMAL HIGH (ref 140–400)
RBC: 5.19 10*6/uL — ABNORMAL HIGH (ref 3.80–5.10)
RDW: 12.6 % (ref 11.0–15.0)
Total Lymphocyte: 35.8 %
WBC: 9.3 10*3/uL (ref 3.8–10.8)

## 2020-06-03 LAB — LIPASE: Lipase: 41 U/L (ref 7–60)

## 2020-06-03 LAB — TSH: TSH: 5.49 mIU/L — ABNORMAL HIGH

## 2020-06-03 LAB — H. PYLORI BREATH TEST: H. pylori Breath Test: NOT DETECTED

## 2020-06-03 MED ORDER — LEVOTHYROXINE SODIUM 25 MCG PO TABS: 25.0000 ug | ORAL_TABLET | Freq: Every day | ORAL | 0 refills | Status: DC

## 2020-06-03 NOTE — Progress Notes (Signed)
Name: Tiffany Nunez   MRN: [id number]    DOB: 1971-08-15   Date:06/04/2020       Progress Note  Subjective  Chief Complaint Diabetes Education/Follow up from 06/02/2020   HPI    N/V abdominal pain/uncontrolled diabetes : she has noticed intermittent upper abdominal pain , increase in reflux symptoms over the past couple of months, however this past weekend she felt very drained, followed by nausea, and after she ate lunch on Saturday she vomited. Smell of food has been making her sick for the past few days. Increase in indigestion and some right upper quadrant pain. She was seen two days ago she came in and we tried to schedule a gallbladder US however she said she was too weak to have it done/drive the day the referral coordinator contacted her. Labs showed increase of liver enzymes, glucose was very high in over 500 , we started her on Farxiga and Tresiba , she changed her diet yesterday. She has not been taking [place] but she started [place] 20 units had a dose yesterday and one dose this morning, glucose is still very high at 386 , urine showed small ketones. She is not feeling well, getting jittery, still nauseated and advised to be seen now at [facility]   Patient Active Problem List   Diagnosis Date Noted  . Mild episode of recurrent major depressive disorder ([place]) 05/28/2018  . History of cerebrovascular accident (CVA) involving cerebellum 06/12/2017  . Acquired autoimmune hypothyroidism 11/09/2016  . Vitamin D deficiency 05/03/2015  . Dyslipidemia 05/03/2015  . Gastro-esophageal reflux disease without esophagitis 05/03/2015  . Alopecia 05/03/2015  . Genital herpes 05/03/2015  . Dysmetabolic syndrome 36/64/4034  . Positive H. pylori test 05/03/2015  . Neuralgia neuritis, sciatic nerve 05/03/2015  . Migraine without aura and responsive to treatment 04/30/2014  . HTN (hypertension) 01/22/2014  . Morbid obesity ([place]) 01/22/2014  . Diabetes mellitus type 2 in obese ([place]) 01/22/2014  .  Decreased cardiac ejection fraction 01/22/2014    Past Surgical History:  Procedure Laterality Date  . ABDOMINAL HYSTERECTOMY    . ANKLE SURGERY     right  . TUBAL LIGATION      Family History  Problem Relation Age of Onset  . Hypertension Father   . Hyperlipidemia Father   . Hyperlipidemia Mother   . Hypertension Mother   . Hypertension Paternal Aunt   . Cancer Maternal Grandmother        breast    Social History   Tobacco Use  . Smoking status: Never Smoker  . Smokeless tobacco: Never Used  Substance Use Topics  . Alcohol use: No    Alcohol/week: 0.0 standard drinks     Current Outpatient Medications:  .  Accu-Chek Softclix Lancets lancets, SMARTSIG:Topical, Disp: , Rfl:  .  atorvastatin (LIPITOR) 40 MG tablet, Take 1 tablet (40 mg total) by mouth every other day., Disp: 45 tablet, Rfl: 1 .  blood glucose meter kit and supplies, Dispense based on patient and insurance preference. FSBS twice daily  (FOR ICD-10 E10.9, E11.9)., Disp: 1 each, Rfl: 0 .  dapagliflozin propanediol (FARXIGA) 10 MG TABS tablet, Take 1 tablet (10 mg total) by mouth daily before breakfast., Disp: 90 tablet, Rfl: 0 .  DULoxetine (CYMBALTA) 60 MG capsule, Take 1 capsule (60 mg total) by mouth daily., Disp: 90 capsule, Rfl: 1 .  fluticasone (FLONASE) 50 MCG/ACT nasal spray, Place 2 sprays into both nostrils daily., Disp: 16 g, Rfl: 6 .  hydrALAZINE (APRESOLINE) 50 MG  tablet, Take 1 tablet (50 mg total) by mouth 2 (two) times daily., Disp: 180 tablet, Rfl: 1 .  ibuprofen (ADVIL) 800 MG tablet, TAKE 1 TABLET BY MOUTH EVERY DAY AS NEEDED, Disp: 90 tablet, Rfl: 0 .  insulin degludec (TRESIBA FLEXTOUCH) 100 UNIT/ML FlexTouch Pen, Inject 16 Units into the skin daily., Disp: 15 mL, Rfl: 0 .  Insulin Pen Needle (NOVOFINE) 30G X 8 MM MISC, Inject 10 each into the skin as needed., Disp: 100 each, Rfl: 1 .  levothyroxine (SYNTHROID) 25 MCG tablet, Take 1 tablet (25 mcg total) by mouth daily before breakfast.,  Disp: 90 tablet, Rfl: 0 .  nadolol (CORGARD) 40 MG tablet, Take 1 tablet (40 mg total) by mouth 2 (two) times daily., Disp: 180 tablet, Rfl: 1 .  nortriptyline (PAMELOR) 10 MG capsule, TAKE 1 CAPSULE BY MOUTH EVERYDAY AT BEDTIME, Disp: 90 capsule, Rfl: 0 .  Olmesartan-amLODIPine-HCTZ 40-10-25 MG TABS, Take 1 tablet by mouth daily., Disp: 90 tablet, Rfl: 1 .  ondansetron (ZOFRAN-ODT) 4 MG disintegrating tablet, TAKE 1 TABLET BY MOUTH EVERY 8 HOURS AS NEEDED FOR NAUSEA AND VOMITING, Disp: 20 tablet, Rfl: 0 .  Vitamin D, Ergocalciferol, (DRISDOL) 1.25 MG (50000 UNIT) CAPS capsule, Take 1 capsule (50,000 Units total) by mouth every 7 (seven) days., Disp: 12 capsule, Rfl: 1  Allergies  Allergen Reactions  . Imitrex [Sumatriptan] Anaphylaxis  . Topamax [Topiramate] Other (See Comments)    Caused hand numbness   . Ubrogepant Other (See Comments)    [name] made her feel groggy the following day and bad taste in her month     I personally reviewed active problem list, medication list, allergies, family history, social history, health maintenance with the patient/caregiver today.   ROS  Ten systems reviewed and is negative except as mentioned in HPI  She is also feeling jittery, having headaches, nausea, fatigue and abdominal pain   Objective  Vitals:   06/04/20 1329  BP: 140/90  Pulse: 88  Resp: 16  Temp: 97.9 F (36.6 C)  TempSrc: Oral  SpO2: 98%  Weight: (!) 307 lb 4.8 oz (139.4 kg)  Height: _0  (1.702 m)    Body mass index is 48.13 kg/m.  Physical Exam  Constitutional: Patient appears well-developed and well-nourished. Obese  No distress.  HEENT: head atraumatic, normocephalic, pupils equal and reactive to light, neck supple Cardiovascular: Normal rate, regular rhythm and normal heart sounds.  No murmur heard. No BLE edema. Pulmonary/Chest: Effort normal and breath sounds normal. No respiratory distress. Abdominal: Soft.  There is right upper quadrant tenderness   Psychiatric: Patient has a normal mood and affect. behavior is normal. Judgment and thought content normal.  Recent Results (from the past 2160 hour(s))  POCT HgB A1C     Status: Abnormal   Collection Time: 06/02/20  8:08 AM  Result Value Ref Range   Hemoglobin A1C 10.6 (A) 4.0 - 5.6 %   HbA1c POC (<> result, manual entry)     HbA1c, POC (prediabetic range)     HbA1c, POC (controlled diabetic range)    TSH     Status: Abnormal   Collection Time: 06/02/20  8:33 AM  Result Value Ref Range   TSH 5.49 (H) mIU/L    Comment:           Reference Range .           > or = 20 Years  0.40-4.50 .  Pregnancy Ranges           First trimester    0.26-2.66           Second trimester   0.55-2.73           Third trimester    0.43-2.91   COMPLETE METABOLIC PANEL WITH GFR     Status: Abnormal   Collection Time: 06/02/20  8:33 AM  Result Value Ref Range   Glucose, Bld 539 (HH) 65 - 99 mg/dL    Comment: Verified by repeat analysis. Marland Kitchen .            Fasting reference interval . For someone without known diabetes, a glucose value >125 mg/dL indicates that they may have diabetes and this should be confirmed with a follow-up test. .    BUN 14 7 - 25 mg/dL   Creat 1.18 (H) 0.50 - 1.10 mg/dL   GFR, Est Non African American 54 (L) > OR = 60 mL/min/1.69m   GFR, Est African American 63 > OR = 60 mL/min/1.755m  BUN/Creatinine Ratio 12 6 - 22 (calc)   Sodium 132 (L) 135 - 146 mmol/L   Potassium 4.1 3.5 - 5.3 mmol/L   Chloride 93 (L) 98 - 110 mmol/L   CO2 27 20 - 32 mmol/L   Calcium 10.2 8.6 - 10.2 mg/dL   Total Protein 7.9 6.1 - 8.1 g/dL   Albumin 4.2 3.6 - 5.1 g/dL   Globulin 3.7 1.9 - 3.7 g/dL (calc)   AG Ratio 1.1 1.0 - 2.5 (calc)   Total Bilirubin 0.6 0.2 - 1.2 mg/dL   Alkaline phosphatase (APISO) 141 (H) 31 - 125 U/L   AST 86 (H) 10 - 35 U/L   ALT 238 (H) 6 - 29 U/L  CBC with Differential/Platelet     Status: Abnormal   Collection Time: 06/02/20  8:33 AM  Result Value  Ref Range   WBC 9.3 3.8 - 10.8 Thousand/uL   RBC 5.19 (H) 3.80 - 5.10 Million/uL   Hemoglobin 15.7 (H) 11.7 - 15.5 g/dL   HCT 46.9 (H) 35 - 45 %   MCV 90.4 80.0 - 100.0 fL   MCH 30.3 27.0 - 33.0 pg   MCHC 33.5 32.0 - 36.0 g/dL   RDW 12.6 11.0 - 15.0 %   Platelets 442 (H) 140 - 400 Thousand/uL   MPV 11.8 7.5 - 12.5 fL   Neutro Abs 5,301 1,500 - 7,800 cells/uL   Lymphs Abs 3,329 850 - 3,900 cells/uL   Absolute Monocytes 530 200 - 950 cells/uL   Eosinophils Absolute 93 15 - 500 cells/uL   Basophils Absolute 47 0 - 200 cells/uL   Neutrophils Relative % 57 %   Total Lymphocyte 35.8 %   Monocytes Relative 5.7 %   Eosinophils Relative 1.0 %   Basophils Relative 0.5 %  Lipase     Status: None   Collection Time: 06/02/20  8:33 AM  Result Value Ref Range   Lipase 41 7 - 60 U/L  H. pylori breath test     Status: None   Collection Time: 06/02/20  8:33 AM  Result Value Ref Range   H. pylori Breath Test NOT DETECTED NOT DETECT    Comment: . Antimicrobials, proton pump inhibitors, and bismuth preparations are known to suppress H. pylori, and  ingestion of these prior to H. pylori diagnostic testing may lead to false negative results. If clinically  indicated, the test may be repeated on a new specimen obtained  two weeks after discontinuing treatment. However, a positive result is still clinically valid.   POCT Glucose (Device for Home Use)     Status: Abnormal   Collection Time: 06/04/20  1:38 PM  Result Value Ref Range   Glucose Fasting, POC 386 (A) 70 - 99 mg/dL   POC Glucose    POCT Urinalysis Dipstick     Status: Abnormal   Collection Time: 06/04/20  1:55 PM  Result Value Ref Range   Color, UA     Clarity, UA     Glucose, UA     Bilirubin, UA     Ketones, UA Positive     Comment: Small   Spec Grav, UA     Blood, UA     pH, UA     Protein, UA     Urobilinogen, UA     Nitrite, UA     Leukocytes, UA     Appearance     Odor        PHQ2/9: Depression screen [facility] 2/9  06/04/2020 06/02/2020 01/28/2020 11/18/2019 10/29/2019  Decreased Interest 1 1 0 1 1  Down, Depressed, Hopeless - 1 0 1 2  PHQ - 2 Score 1 2 0 2 3  Altered sleeping _0 Tired, decreased energy _1 Change in appetite 2 2 0 1 0  Feeling bad or failure about yourself  0 0 0 0 0  Trouble concentrating 0 0 0 0 1  Moving slowly or fidgety/restless 0 0 0 0 0  Suicidal thoughts 0 0 0 0 0  PHQ-9 Score _2 Difficult doing work/chores Somewhat difficult Somewhat difficult - Somewhat difficult Somewhat difficult  Some recent data might be hidden    phq 9 is positive   Fall Risk: Fall Risk  06/04/2020 06/02/2020 01/28/2020 11/18/2019 10/29/2019  Falls in the past year? 0 0 0 0 0  Number falls in past yr: 0 0 0 0 0  Injury with Fall? 0 0 0 0 0  Risk for fall due to : - - - - -  Follow up - - - - -     Functional Status Survey: Is the patient deaf or have difficulty hearing?: Yes Does the patient have difficulty seeing, even when wearing glasses/contacts?: Yes Does the patient have difficulty concentrating, remembering, or making decisions?: No Does the patient have difficulty walking or climbing stairs?: No Does the patient have difficulty dressing or bathing?: No Does the patient have difficulty doing errands alone such as visiting a doctor's office or shopping?: No    Assessment & Plan   1. Uncontrolled type 2 diabetes mellitus with hyperglycemia (HCC)  - POCT Glucose (Device for Home Use) - POCT Urinalysis Dipstick  Spoke to [place], explained patient needs to be seen for further evaluation, may have cholecystitis, may need fluids and possible admission for DKA

## 2020-06-04 ENCOUNTER — Encounter: Payer: Self-pay | Admitting: Family Medicine

## 2020-06-04 ENCOUNTER — Encounter: Payer: Self-pay | Admitting: Emergency Medicine

## 2020-06-04 ENCOUNTER — Other Ambulatory Visit: Payer: Self-pay

## 2020-06-04 ENCOUNTER — Emergency Department: Payer: BC Managed Care – PPO

## 2020-06-04 ENCOUNTER — Ambulatory Visit (INDEPENDENT_AMBULATORY_CARE_PROVIDER_SITE_OTHER): Payer: BC Managed Care – PPO | Admitting: Family Medicine

## 2020-06-04 ENCOUNTER — Emergency Department
Admission: EM | Admit: 2020-06-04 | Discharge: 2020-06-04 | Disposition: A | Discharge status: Home / Self Care | Payer: BC Managed Care – PPO | Attending: Emergency Medicine | Admitting: Emergency Medicine

## 2020-06-04 VITALS — BP 140/90 | HR 88 | Temp 97.9°F | Resp 16 | Ht 67.0 in | Wt 307.3 lb

## 2020-06-04 DIAGNOSIS — E1165 Type 2 diabetes mellitus with hyperglycemia: Secondary | ICD-10-CM | POA: Diagnosis present

## 2020-06-04 DIAGNOSIS — Z8673 Personal history of transient ischemic attack (TIA), and cerebral infarction without residual deficits: Secondary | ICD-10-CM | POA: Diagnosis not present

## 2020-06-04 DIAGNOSIS — K760 Fatty (change of) liver, not elsewhere classified: Secondary | ICD-10-CM | POA: Diagnosis not present

## 2020-06-04 DIAGNOSIS — K7689 Other specified diseases of liver: Secondary | ICD-10-CM | POA: Diagnosis not present

## 2020-06-04 DIAGNOSIS — I10 Essential (primary) hypertension: Secondary | ICD-10-CM | POA: Diagnosis not present

## 2020-06-04 DIAGNOSIS — Z794 Long term (current) use of insulin: Secondary | ICD-10-CM | POA: Insufficient documentation

## 2020-06-04 DIAGNOSIS — E876 Hypokalemia: Secondary | ICD-10-CM | POA: Diagnosis not present

## 2020-06-04 DIAGNOSIS — E1110 Type 2 diabetes mellitus with ketoacidosis without coma: Secondary | ICD-10-CM | POA: Diagnosis not present

## 2020-06-04 DIAGNOSIS — Z79899 Other long term (current) drug therapy: Secondary | ICD-10-CM | POA: Diagnosis not present

## 2020-06-04 DIAGNOSIS — R739 Hyperglycemia, unspecified: Secondary | ICD-10-CM

## 2020-06-04 DIAGNOSIS — R1011 Right upper quadrant pain: Secondary | ICD-10-CM

## 2020-06-04 LAB — BASIC METABOLIC PANEL
Anion gap: 11 (ref 5–15)
BUN: 23 mg/dL — ABNORMAL HIGH (ref 6–20)
CO2: 27 mmol/L (ref 22–32)
Calcium: 10.2 mg/dL (ref 8.9–10.3)
Chloride: 95 mmol/L — ABNORMAL LOW (ref 98–111)
Creatinine, Ser: 0.98 mg/dL (ref 0.44–1.00)
GFR calc Af Amer: >60 mL/min (ref >60)
GFR calc non Af Amer: >60 mL/min (ref >60)
Glucose, Bld: 408 mg/dL — ABNORMAL HIGH (ref 70–99)
Potassium: 3.2 mmol/L — ABNORMAL LOW (ref 3.5–5.1)
Sodium: 133 mmol/L — ABNORMAL LOW (ref 135–145)

## 2020-06-04 LAB — POCT URINALYSIS DIPSTICK: Ketones, UA: POSITIVE

## 2020-06-04 LAB — POCT GLUCOSE (DEVICE FOR HOME USE): Glucose Fasting, POC: 386 mg/dL — AB (ref 70–99)

## 2020-06-04 LAB — CBC
HCT: 43.3 % (ref 36.0–46.0)
Hemoglobin: 15.3 g/dL — ABNORMAL HIGH (ref 12.0–15.0)
MCH: 30.7 pg (ref 26.0–34.0)
MCHC: 35.3 g/dL (ref 30.0–36.0)
MCV: 86.9 fL (ref 80.0–100.0)
Platelets: 378 10*3/uL (ref 150–400)
RBC: 4.98 MIL/uL (ref 3.87–5.11)
RDW: 13.2 % (ref 11.5–15.5)
WBC: 8 10*3/uL (ref 4.0–10.5)
nRBC: 0 % (ref 0.0–0.2)

## 2020-06-04 LAB — GLUCOSE, CAPILLARY: Glucose-Capillary: 406 mg/dL — ABNORMAL HIGH (ref 70–99)

## 2020-06-04 MED ORDER — ACETAMINOPHEN 500 MG PO TABS
1000.0000 mg | ORAL_TABLET | Freq: Once | ORAL | Status: AC
  Administered 2020-06-04: 1000 mg via ORAL
  Filled 2020-06-04: qty 2

## 2020-06-04 MED ORDER — KETOROLAC TROMETHAMINE 30 MG/ML IJ SOLN
15.0000 mg | Freq: Once | INTRAMUSCULAR | Status: AC
  Administered 2020-06-04: 15 mg via INTRAVENOUS
  Filled 2020-06-04: qty 1

## 2020-06-04 MED ORDER — LACTATED RINGERS IV BOLUS
1000.0000 mL | Freq: Once | INTRAVENOUS | Status: AC
  Administered 2020-06-04: 1000 mL via INTRAVENOUS

## 2020-06-04 MED ORDER — SODIUM CHLORIDE 0.9 % IV BOLUS
1000.0000 mL | Freq: Once | INTRAVENOUS | Status: AC
  Administered 2020-06-04: 1000 mL via INTRAVENOUS

## 2020-06-04 MED ORDER — POTASSIUM CHLORIDE 20 MEQ PO PACK
40.0000 meq | PACK | Freq: Once | ORAL | Status: AC
  Administered 2020-06-04: 40 meq via ORAL

## 2020-06-04 NOTE — ED Notes (Signed)
See triage note  States she was recently dx'd with DM    States she feels unsure of what is going on  States her BS was over 500 PTA

## 2020-06-04 NOTE — Discharge Instructions (Addendum)
Please continue all your diabetic medications at home, including both the pill and the insulin that your doctor prescribed.  Please follow-up with your PCP to discuss her continued diabetic regimen and to discuss getting a CT scan of your liver. Like we talked about, you have changes of fatty liver on your ultrasound, normal-appearing gallbladder and a poorly visualized spot that looks like a cyst.  A CT scan will better help visualize this.   Return to the ED with any worsening abdominal pain, yellow discoloration of eyes or lips.

## 2020-06-04 NOTE — ED Provider Notes (Signed)
[facility] Emergency Department Provider Note ____________________________________________   First MD Initiated Contact with Patient 06/04/20 1730     (approximate)  I have reviewed the triage vital signs and the nursing notes.  HISTORY  Chief Complaint Hyperglycemia   HPI Tiffany Nunez is a 49 y.o. femalewho presents to the ED for evaluation of hyperglycemia.   Chart review indicates recent A1c of 10.6.  Patient was just started on Farxiga oral DM medication and Tresiba insulin 2 days ago by PCP.  Patient return to the office of her PCP today after starting the 2 new medications, noted to have continued hyperglycemia and ketonuria, so patient was sent to the ED for evaluation of DKA.  Here in the ED, patient reports feeling mild generalized weakness, polyuria and polydipsia.  She is further reporting a "migraine" that she reports is typical for her: Aching bitemporal headache that is 4/10 intensity.  Patient further reporting intermittent right flank/RUQ pain.  She minimizes this and indicates that it does not occur regularly or even daily, and up to 2/10 intensity pain.  She denies any associated nausea or vomiting.  Denies postprandial pain.  She reports her PCP is concerned about her gallbladder and is requesting ultrasound.  She denies chest pain, fevers, shortness of breath, syncope, dysuria, cough, vision change or trauma.  Past Medical History:  Diagnosis Date  . Anemia   . Depressive disorder   . Diabetes mellitus, type II ([place])   . Esophageal reflux   . Essential hypertension, benign   . Genital herpes, unspecified   . Helicobacter pylori (H. pylori)   . Metabolic syndrome   . Migraine, unspecified, without mention of intractable migraine without mention of status migrainosus   . Nonspecific abnormal electrocardiogram (ECG) (EKG)   . Nonspecific abnormal results of thyroid function study   . Obesity, unspecified   . Other and unspecified  hyperlipidemia   . Unspecified vitamin D deficiency     Patient Active Problem List   Diagnosis Date Noted  . Mild episode of recurrent major depressive disorder ([place]) 05/28/2018  . History of cerebrovascular accident (CVA) involving cerebellum 06/12/2017  . Acquired autoimmune hypothyroidism 11/09/2016  . Vitamin D deficiency 05/03/2015  . Dyslipidemia 05/03/2015  . Gastro-esophageal reflux disease without esophagitis 05/03/2015  . Alopecia 05/03/2015  . Genital herpes 05/03/2015  . Dysmetabolic syndrome 59/56/3875  . Positive H. pylori test 05/03/2015  . Neuralgia neuritis, sciatic nerve 05/03/2015  . Migraine without aura and responsive to treatment 04/30/2014  . HTN (hypertension) 01/22/2014  . Morbid obesity ([place]) 01/22/2014  . Diabetes mellitus type 2 in obese ([place]) 01/22/2014  . Decreased cardiac ejection fraction 01/22/2014    Past Surgical History:  Procedure Laterality Date  . ABDOMINAL HYSTERECTOMY    . ANKLE SURGERY     right  . TUBAL LIGATION      Prior to Admission medications   Medication Sig Start Date End Date Taking? Authorizing Provider  Accu-Chek Softclix Lancets lancets SMARTSIG:Topical 06/02/20   [provider]  atorvastatin (LIPITOR) 40 MG tablet Take 1 tablet (40 mg total) by mouth every other day. 10/29/19   Steele Sizer, MD  blood glucose meter kit and supplies Dispense based on patient and insurance preference. FSBS twice daily  (FOR ICD-10 E10.9, E11.9). 06/02/20   Steele Sizer, MD  dapagliflozin propanediol (FARXIGA) 10 MG TABS tablet Take 1 tablet (10 mg total) by mouth daily before breakfast. 06/02/20   Steele Sizer, MD  DULoxetine (CYMBALTA) 60 MG  capsule Take 1 capsule (60 mg total) by mouth daily. 06/02/20   Steele Sizer, MD  fluticasone (FLONASE) 50 MCG/ACT nasal spray Place 2 sprays into both nostrils daily. 01/28/20   Steele Sizer, MD  hydrALAZINE (APRESOLINE) 50 MG tablet Take 1 tablet (50 mg total) by mouth 2 (two) times  daily. 01/28/20   Steele Sizer, MD  ibuprofen (ADVIL) 800 MG tablet TAKE 1 TABLET BY MOUTH EVERY DAY AS NEEDED 04/29/20   Ancil Boozer, Drue Stager, MD  insulin degludec (TRESIBA FLEXTOUCH) 100 UNIT/ML FlexTouch Pen Inject 16 Units into the skin daily. 06/02/20   Steele Sizer, MD  Insulin Pen Needle (NOVOFINE) 30G X 8 MM MISC Inject 10 each into the skin as needed. 06/02/20   Steele Sizer, MD  levothyroxine (SYNTHROID) 25 MCG tablet Take 1 tablet (25 mcg total) by mouth daily before breakfast. 06/03/20   Ancil Boozer, Drue Stager, MD  nadolol (CORGARD) 40 MG tablet Take 1 tablet (40 mg total) by mouth 2 (two) times daily. 06/02/20   Steele Sizer, MD  nortriptyline (PAMELOR) 10 MG capsule TAKE 1 CAPSULE BY MOUTH EVERYDAY AT BEDTIME 06/02/20   Steele Sizer, MD  Olmesartan-amLODIPine-HCTZ 40-10-25 MG TABS Take 1 tablet by mouth daily. 06/02/20   Sowles, Drue Stager, MD  ondansetron (ZOFRAN-ODT) 4 MG disintegrating tablet TAKE 1 TABLET BY MOUTH EVERY 8 HOURS AS NEEDED FOR NAUSEA AND VOMITING 06/02/20   Steele Sizer, MD  Vitamin D, Ergocalciferol, (DRISDOL) 1.25 MG (50000 UNIT) CAPS capsule Take 1 capsule (50,000 Units total) by mouth every 7 (seven) days. 06/02/20   Steele Sizer, MD    Allergies Imitrex [sumatriptan], Topamax [topiramate], and Ubrogepant  Family History  Problem Relation Age of Onset  . Hypertension Father   . Hyperlipidemia Father   . Hyperlipidemia Mother   . Hypertension Mother   . Hypertension Paternal Aunt   . Cancer Maternal Grandmother        breast    Social History Social History   Tobacco Use  . Smoking status: Never Smoker  . Smokeless tobacco: Never Used  Vaping Use  . Vaping Use: Never used  Substance Use Topics  . Alcohol use: No    Alcohol/week: 0.0 standard drinks  . Drug use: No    Review of Systems  Constitutional: No fever/chills.  Positive generalized weakness. Eyes: No visual changes. ENT: No sore throat. Cardiovascular: Denies chest  pain. Respiratory: Denies shortness of breath. Gastrointestinal:   No nausea, no vomiting.  No diarrhea.  No constipation.  Positive for polydipsia and abdominal pain. Genitourinary: Negative for dysuria.  Positive for polyuria Musculoskeletal: Negative for back pain. Skin: Negative for rash. Neurological: Negative for  focal weakness or numbness.  Positive for headache  ____________________________________________   PHYSICAL EXAM:  VITAL SIGNS: Vitals:   06/04/20 1449 06/04/20 1734  BP: 128/81 120/80  Pulse: 72 70  Resp: 20 20  Temp: 99 F (37.2 C)   SpO2: 98% 98%     Constitutional: Alert and oriented. Well appearing and in no acute distress.  Obese.  Sitting up in bed and well-appearing.  Conversational in full sentences. Eyes: Conjunctivae are normal. PERRL. EOMI. Head: Atraumatic. Nose: No congestion/rhinnorhea. Mouth/Throat: Mucous membranes are moist.  Oropharynx non-erythematous. Neck: No stridor. No cervical spine tenderness to palpation. Cardiovascular: Normal rate, regular rhythm. Grossly normal heart sounds.  Good peripheral circulation. Respiratory: Normal respiratory effort.  No retractions. Lungs CTAB. Gastrointestinal: Soft , nondistended. No abdominal bruits. No CVA tenderness.  Benign abdomen without tenderness throughout. Musculoskeletal: No lower extremity tenderness nor  edema.  No joint effusions. No signs of acute trauma. Neurologic:  Normal speech and language. No gross focal neurologic deficits are appreciated. No gait instability noted. Skin:  Skin is warm, dry and intact. No rash noted. Psychiatric: Mood and affect are normal. Speech and behavior are normal.  ____________________________________________   LABS (all labs ordered are listed, but only abnormal results are displayed)  Labs Reviewed  GLUCOSE, CAPILLARY - Abnormal; Notable for the following components:      Result Value   Glucose-Capillary 406 (*)    All other components within  normal limits  BASIC METABOLIC PANEL - Abnormal; Notable for the following components:   Sodium 133 (*)    Potassium 3.2 (*)    Chloride 95 (*)    Glucose, Bld 408 (*)    BUN 23 (*)    All other components within normal limits  CBC - Abnormal; Notable for the following components:   Hemoglobin 15.3 (*)    All other components within normal limits  URINALYSIS, COMPLETE (UACMP) WITH MICROSCOPIC  CBG MONITORING, ED  CBG MONITORING, ED  POC URINE PREG, ED  CBG MONITORING, ED   ____________________________________________  RADIOLOGY  ED MD interpretation:    Official radiology report(s): US Abdomen Limited RUQ  Result Date: 06/04/2020 CLINICAL DATA:  Right upper quadrant abdominal pain EXAM: ULTRASOUND ABDOMEN LIMITED RIGHT UPPER QUADRANT COMPARISON:  None. FINDINGS: Gallbladder: No gallstones or wall thickening visualized. No sonographic Murphy sign noted by sonographer. Common bile duct: Not well visualized due to acoustic attenuation through the hepatic parenchyma and overlying bowel gas. Liver: The liver parenchyma is diffusely echogenic and there is poor acoustic through transmission in keeping with changes of moderate to severe hepatic steatosis. There is suggestion of a a subserosal lobulated hypoechoic mass within the right hepatic lobe, best seen on image # 41, not well characterized on this examination. There is no intrahepatic biliary ductal dilation identified. Portal vein is patent on color Doppler imaging with normal direction of blood flow towards the liver. Other: No ascites IMPRESSION: 1. The common bile duct is not well visualized due to acoustic attenuation through the hepatic parenchyma and overlying bowel gas. No intrahepatic biliary ductal dilation identified. 2. Diffuse hepatic steatosis 3. Possible subserosal lobulated hypoechoic mass within the right hepatic lobe. This is not well characterized on this examination. Further evaluation with contrast enhanced CT or MRI is  recommended. Electronically Signed   By: Fidela Salisbury MD   On: 06/04/2020 19:04    ____________________________________________   PROCEDURES and INTERVENTIONS  Procedure(s) performed (including Critical Care):  Procedures  Medications  sodium chloride 0.9 % bolus 1,000 mL (0 mLs Intravenous Stopped 06/04/20 1732)  lactated ringers bolus 1,000 mL (0 mLs Intravenous Stopped 06/04/20 2037)  potassium chloride (KLOR-CON) packet 40 mEq (40 mEq Oral Given 06/04/20 1817)  ketorolac (TORADOL) 30 MG/ML injection 15 mg (15 mg Intravenous Given 06/04/20 1816)  acetaminophen (TYLENOL) tablet 1,000 mg (1,000 mg Oral Given 06/04/20 1816)    ____________________________________________   MDM / ED COURSE  49 year old female presents from her PCPs office with hyperglycemia without evidence of acidosis and amenable to outpatient management.  Normal vital signs on room air.  Exam is reassuring, demonstrating an obese patient without evidence of acute derangements.  She looks well, sits up in bed and is conversational full sentences.  She has no abdominal tenderness, distress, signs of trauma or neurovascular deficits.  Blood work demonstrates hyperglycemia to the 400s without acidosis.  Mild pseudohyponatremia and mild hypokalemia  that was repleted orally.  At the request of her PCP, RUQ ultrasound obtained and demonstrates fatty infiltration of her liver as well as a partially visualized hypoechoic structure that may represent a cyst versus mass of uncertain significance.  Considering her benign abdominal exam, and benign symptoms with normal p.o. intake without postprandial abdominal pain, jaundice, nausea or vomiting, I think it is reasonable to follow-up with a CT image as an outpatient.  I discussed this with the patient, and she agrees that she would rather go home and not wait in the ED for a CT scan that she can have done later.  We discussed outpatient management of her hyperglycemia and diabetes.  We  further discussed return precautions for the ED for her diabetes, for signs of DKA, and for signs of biliary obstruction.  We discussed following up with her PCP for continued diabetic management as well as to facilitate this CT abdomen image.  Patient medically stable for discharge home.  Clinical Course as of Jun 05 2051  Thu Jun 04, 2020  1930 Reassessed.  Patient ports improved symptoms.  Educated patient on right upper quadrant ultrasound without biliary disease, but does partially visualize hypoechoic structure within the liver.  We discussed possibilities to include cyst, fatty infiltration or mass.  Considering her lack of symptoms there is no reason this cannot be followed up as an outpatient with her PCP , who is already closely monitoring the patient's diabetes.   [DS]    Clinical Course User Index [DS] Vladimir Crofts, MD     ____________________________________________   FINAL CLINICAL IMPRESSION(S) / ED DIAGNOSES  Final diagnoses:  RUQ abdominal pain  Hyperglycemia  Hypokalemia  Liver cyst  Fatty liver     ED Discharge Orders    None       Dylan Tamala Julian   Note:  This document was prepared using Dragon voice recognition software and may include unintentional dictation errors.   Vladimir Crofts, MD 06/04/20 [phone]

## 2020-06-04 NOTE — ED Triage Notes (Signed)
Pt reports had blood work done by her MD and her blood sugar came back at 549. Pt states her MD rechecked it at the office today and it came down to 300 something. Pt states that her MD also put her on insulin today. Pt states her MD told her to come to the ED to see if we would get her blood sugar down enough for her to function. Pt ambulatory, in no distress. Pt states took 20 unit this am when her MD prescribed it.

## 2020-06-05 ENCOUNTER — Other Ambulatory Visit: Payer: Self-pay | Admitting: Family Medicine

## 2020-06-05 DIAGNOSIS — K769 Liver disease, unspecified: Secondary | ICD-10-CM

## 2020-06-05 DIAGNOSIS — E876 Hypokalemia: Secondary | ICD-10-CM

## 2020-06-05 DIAGNOSIS — R932 Abnormal findings on diagnostic imaging of liver and biliary tract: Secondary | ICD-10-CM

## 2020-06-05 MED ORDER — POTASSIUM CHLORIDE CRYS ER 20 MEQ PO TBCR: 20.0000 meq | EXTENDED_RELEASE_TABLET | Freq: Two times a day (BID) | ORAL | 3 refills | Status: DC

## 2020-06-08 ENCOUNTER — Encounter: Payer: Self-pay | Admitting: Family Medicine

## 2020-06-08 ENCOUNTER — Ambulatory Visit (INDEPENDENT_AMBULATORY_CARE_PROVIDER_SITE_OTHER): Payer: BC Managed Care – PPO | Admitting: Family Medicine

## 2020-06-08 ENCOUNTER — Other Ambulatory Visit: Payer: Self-pay

## 2020-06-08 ENCOUNTER — Other Ambulatory Visit: Payer: Self-pay | Admitting: Family Medicine

## 2020-06-08 VITALS — BP 118/78 | HR 68 | Temp 98.3°F | Resp 16 | Ht 67.0 in | Wt 308.0 lb

## 2020-06-08 DIAGNOSIS — R932 Abnormal findings on diagnostic imaging of liver and biliary tract: Secondary | ICD-10-CM

## 2020-06-08 DIAGNOSIS — E1165 Type 2 diabetes mellitus with hyperglycemia: Secondary | ICD-10-CM

## 2020-06-08 LAB — GLUCOSE, POCT (MANUAL RESULT ENTRY): POC Glucose: 297 mg/dl — AB (ref 70–99)

## 2020-06-08 MED ORDER — OZEMPIC (0.25 OR 0.5 MG/DOSE) 2 MG/1.5ML ~~LOC~~ SOPN: 0.2500 mg | PEN_INJECTOR | SUBCUTANEOUS | 2 refills | Status: DC

## 2020-06-08 NOTE — Patient Instructions (Addendum)
Continue to increase Tresiba by 2 units every 3 days to keep glucose fasting ( morning ) between 100-140  Tomorrow start at 36 units   Hydralazine: if bp is below 110/75 , either skip dose of hydralazine or take half pill

## 2020-06-08 NOTE — Progress Notes (Signed)
Name: Tiffany Nunez   MRN: [id number]    DOB: Jun 29, 1971   Date:06/08/2020       Progress Note  Subjective  Chief Complaint  Chief Complaint  Patient presents with  . Diabetes    Follow Up on Glucose Readings    HPI  Uncontrolled DM: she is now on Tresiba 30 units daily, states glucose fasting in the low 200's, but can go up to 300 after meals, headache, nausea, abdominal pain has all improved. She is feeling more energized and ready to go back to work.  Reviewed labs done at [facility]. She states took potassium and is feeling fine, prefers not doing more labs today.  We will adjust dose of Tresiba to 36 units and gave written instructions to go up by 2 units every 3 days to keep glucose between 100-140, add Ozempic, discussed possible side effects ( she took Trulicity before but did not like the pain associated with the shots), continue [place].   Abnormal finding on [place]: discussed results, explained CT has been ordered   Patient Active Problem List   Diagnosis Date Noted  . Mild episode of recurrent major depressive disorder ([place]) 05/28/2018  . History of cerebrovascular accident (CVA) involving cerebellum 06/12/2017  . Acquired autoimmune hypothyroidism 11/09/2016  . Vitamin D deficiency 05/03/2015  . Dyslipidemia 05/03/2015  . Gastro-esophageal reflux disease without esophagitis 05/03/2015  . Alopecia 05/03/2015  . Genital herpes 05/03/2015  . Dysmetabolic syndrome 85/46/2703  . Positive H. pylori test 05/03/2015  . Neuralgia neuritis, sciatic nerve 05/03/2015  . Migraine without aura and responsive to treatment 04/30/2014  . HTN (hypertension) 01/22/2014  . Morbid obesity ([place]) 01/22/2014  . Diabetes mellitus type 2 in obese ([place]) 01/22/2014  . Decreased cardiac ejection fraction 01/22/2014    Past Surgical History:  Procedure Laterality Date  . ABDOMINAL HYSTERECTOMY    . ANKLE SURGERY     right  . TUBAL LIGATION      Family History  Problem Relation Age of Onset  .  Hypertension Father   . Hyperlipidemia Father   . Hyperlipidemia Mother   . Hypertension Mother   . Hypertension Paternal Aunt   . Cancer Maternal Grandmother        breast    Social History   Tobacco Use  . Smoking status: Never Smoker  . Smokeless tobacco: Never Used  Substance Use Topics  . Alcohol use: No    Alcohol/week: 0.0 standard drinks     Current Outpatient Medications:  .  Accu-Chek Softclix Lancets lancets, SMARTSIG:Topical, Disp: , Rfl:  .  atorvastatin (LIPITOR) 40 MG tablet, Take 1 tablet (40 mg total) by mouth every other day., Disp: 45 tablet, Rfl: 1 .  blood glucose meter kit and supplies, Dispense based on patient and insurance preference. FSBS twice daily  (FOR ICD-10 E10.9, E11.9)., Disp: 1 each, Rfl: 0 .  dapagliflozin propanediol (FARXIGA) 10 MG TABS tablet, Take 1 tablet (10 mg total) by mouth daily before breakfast., Disp: 90 tablet, Rfl: 0 .  DULoxetine (CYMBALTA) 60 MG capsule, Take 1 capsule (60 mg total) by mouth daily., Disp: 90 capsule, Rfl: 1 .  fluticasone (FLONASE) 50 MCG/ACT nasal spray, Place 2 sprays into both nostrils daily., Disp: 16 g, Rfl: 6 .  hydrALAZINE (APRESOLINE) 50 MG tablet, Take 1 tablet (50 mg total) by mouth 2 (two) times daily., Disp: 180 tablet, Rfl: 1 .  ibuprofen (ADVIL) 800 MG tablet, TAKE 1 TABLET BY MOUTH EVERY DAY AS NEEDED, Disp: 90 tablet,  Rfl: 0 .  insulin degludec (TRESIBA FLEXTOUCH) 100 UNIT/ML FlexTouch Pen, Inject 16 Units into the skin daily. (Patient taking differently: Inject 30 Units into the skin daily. ), Disp: 15 mL, Rfl: 0 .  Insulin Pen Needle (NOVOFINE) 30G X 8 MM MISC, Inject 10 each into the skin as needed., Disp: 100 each, Rfl: 1 .  levothyroxine (SYNTHROID) 25 MCG tablet, Take 1 tablet (25 mcg total) by mouth daily before breakfast., Disp: 90 tablet, Rfl: 0 .  nadolol (CORGARD) 40 MG tablet, Take 1 tablet (40 mg total) by mouth 2 (two) times daily., Disp: 180 tablet, Rfl: 1 .  nortriptyline (PAMELOR) 10  MG capsule, TAKE 1 CAPSULE BY MOUTH EVERYDAY AT BEDTIME, Disp: 90 capsule, Rfl: 0 .  Olmesartan-amLODIPine-HCTZ 40-10-25 MG TABS, Take 1 tablet by mouth daily., Disp: 90 tablet, Rfl: 1 .  ondansetron (ZOFRAN-ODT) 4 MG disintegrating tablet, TAKE 1 TABLET BY MOUTH EVERY 8 HOURS AS NEEDED FOR NAUSEA AND VOMITING, Disp: 20 tablet, Rfl: 0 .  potassium chloride SA (KLOR-CON) 20 MEQ tablet, Take 1 tablet (20 mEq total) by mouth 2 (two) times daily., Disp: 6 tablet, Rfl: 3 .  Semaglutide,0.25 or 0.5MG/DOS, (OZEMPIC, 0.25 OR 0.5 MG/DOSE,) 2 MG/1.5ML SOPN, Inject 0.25-0.5 mg into the skin once a week. Start at 0.25 for 3 weeks after that go up to 0.5 mg and stay there, Disp: 1.5 mL, Rfl: 2 .  Vitamin D, Ergocalciferol, (DRISDOL) 1.25 MG (50000 UNIT) CAPS capsule, Take 1 capsule (50,000 Units total) by mouth every 7 (seven) days., Disp: 12 capsule, Rfl: 1  Allergies  Allergen Reactions  . Imitrex [Sumatriptan] Anaphylaxis  . Topamax [Topiramate] Other (See Comments)    Caused hand numbness   . Ubrogepant Other (See Comments)    [name] made her feel groggy the following day and bad taste in her month     I personally reviewed active problem list, medication list, allergies, family history, social history, health maintenance with the patient/caregiver today.   ROS  Ten systems reviewed and is negative except as mentioned in HPI   Objective  Vitals:   06/08/20 1308  BP: 118/78  Pulse: 68  Resp: 16  Temp: 98.3 F (36.8 C)  SpO2: 98%  Weight: (!) 308 lb (139.7 kg)  Height: _0  (1.702 m)    Body mass index is 48.24 kg/m.  Physical Exam  Constitutional: Patient appears well-developed and well-nourished. Obese  No distress.  HEENT: head atraumatic, normocephalic, pupils equal and reactive to light,neck supple Cardiovascular: Normal rate, regular rhythm and normal heart sounds.  No murmur heard. No BLE edema. Pulmonary/Chest: Effort normal and breath sounds normal. No respiratory  distress. Abdominal: Soft.  There is no tenderness. Psychiatric: Patient has a normal mood and affect. behavior is normal. Judgment and thought content normal.  Recent Results (from the past 2160 hour(s))  POCT HgB A1C     Status: Abnormal   Collection Time: 06/02/20  8:08 AM  Result Value Ref Range   Hemoglobin A1C 10.6 (A) 4.0 - 5.6 %   HbA1c POC (<> result, manual entry)     HbA1c, POC (prediabetic range)     HbA1c, POC (controlled diabetic range)    TSH     Status: Abnormal   Collection Time: 06/02/20  8:33 AM  Result Value Ref Range   TSH 5.49 (H) mIU/L    Comment:           Reference Range .           >  or = 20 Years  0.40-4.50 .                Pregnancy Ranges           First trimester    0.26-2.66           Second trimester   0.55-2.73           Third trimester    0.43-2.91   COMPLETE METABOLIC PANEL WITH GFR     Status: Abnormal   Collection Time: 06/02/20  8:33 AM  Result Value Ref Range   Glucose, Bld 539 (HH) 65 - 99 mg/dL    Comment: Verified by repeat analysis. Marland Kitchen .            Fasting reference interval . For someone without known diabetes, a glucose value >125 mg/dL indicates that they may have diabetes and this should be confirmed with a follow-up test. .    BUN 14 7 - 25 mg/dL   Creat 1.18 (H) 0.50 - 1.10 mg/dL   GFR, Est Non African American 54 (L) > OR = 60 mL/min/1.31m   GFR, Est African American 63 > OR = 60 mL/min/1.745m  BUN/Creatinine Ratio 12 6 - 22 (calc)   Sodium 132 (L) 135 - 146 mmol/L   Potassium 4.1 3.5 - 5.3 mmol/L   Chloride 93 (L) 98 - 110 mmol/L   CO2 27 20 - 32 mmol/L   Calcium 10.2 8.6 - 10.2 mg/dL   Total Protein 7.9 6.1 - 8.1 g/dL   Albumin 4.2 3.6 - 5.1 g/dL   Globulin 3.7 1.9 - 3.7 g/dL (calc)   AG Ratio 1.1 1.0 - 2.5 (calc)   Total Bilirubin 0.6 0.2 - 1.2 mg/dL   Alkaline phosphatase (APISO) 141 (H) 31 - 125 U/L   AST 86 (H) 10 - 35 U/L   ALT 238 (H) 6 - 29 U/L  CBC with Differential/Platelet     Status: Abnormal    Collection Time: 06/02/20  8:33 AM  Result Value Ref Range   WBC 9.3 3.8 - 10.8 Thousand/uL   RBC 5.19 (H) 3.80 - 5.10 Million/uL   Hemoglobin 15.7 (H) 11.7 - 15.5 g/dL   HCT 46.9 (H) 35 - 45 %   MCV 90.4 80.0 - 100.0 fL   MCH 30.3 27.0 - 33.0 pg   MCHC 33.5 32.0 - 36.0 g/dL   RDW 12.6 11.0 - 15.0 %   Platelets 442 (H) 140 - 400 Thousand/uL   MPV 11.8 7.5 - 12.5 fL   Neutro Abs 5,301 1,500 - 7,800 cells/uL   Lymphs Abs 3,329 850 - 3,900 cells/uL   Absolute Monocytes 530 200 - 950 cells/uL   Eosinophils Absolute 93 15 - 500 cells/uL   Basophils Absolute 47 0 - 200 cells/uL   Neutrophils Relative % 57 %   Total Lymphocyte 35.8 %   Monocytes Relative 5.7 %   Eosinophils Relative 1.0 %   Basophils Relative 0.5 %  Lipase     Status: None   Collection Time: 06/02/20  8:33 AM  Result Value Ref Range   Lipase 41 7 - 60 U/L  H. pylori breath test     Status: None   Collection Time: 06/02/20  8:33 AM  Result Value Ref Range   H. pylori Breath Test NOT DETECTED NOT DETECT    Comment: . Antimicrobials, proton pump inhibitors, and bismuth preparations are known to suppress H. pylori, and  ingestion of these prior to H. pylori  diagnostic testing may lead to false negative results. If clinically  indicated, the test may be repeated on a new specimen obtained two weeks after discontinuing treatment. However, a positive result is still clinically valid.   POCT Glucose (Device for Home Use)     Status: Abnormal   Collection Time: 06/04/20  1:38 PM  Result Value Ref Range   Glucose Fasting, POC 386 (A) 70 - 99 mg/dL   POC Glucose    POCT Urinalysis Dipstick     Status: Abnormal   Collection Time: 06/04/20  1:55 PM  Result Value Ref Range   Color, UA     Clarity, UA     Glucose, UA     Bilirubin, UA     Ketones, UA Positive     Comment: Small   Spec Grav, UA     Blood, UA     pH, UA     Protein, UA     Urobilinogen, UA     Nitrite, UA     Leukocytes, UA     Appearance      Odor    Glucose, capillary     Status: Abnormal   Collection Time: 06/04/20  2:58 PM  Result Value Ref Range   Glucose-Capillary 406 (H) 70 - 99 mg/dL    Comment: Glucose reference range applies only to samples taken after fasting for at least 8 hours.  Basic metabolic panel     Status: Abnormal   Collection Time: 06/04/20  3:08 PM  Result Value Ref Range   Sodium 133 (L) 135 - 145 mmol/L   Potassium 3.2 (L) 3.5 - 5.1 mmol/L   Chloride 95 (L) 98 - 111 mmol/L   CO2 27 22 - 32 mmol/L   Glucose, Bld 408 (H) 70 - 99 mg/dL    Comment: Glucose reference range applies only to samples taken after fasting for at least 8 hours.   BUN 23 (H) 6 - 20 mg/dL   Creatinine, Ser 0.98 0.44 - 1.00 mg/dL   Calcium 10.2 8.9 - 10.3 mg/dL   GFR calc non Af Amer >60 >60 mL/min   GFR calc Af Amer >60 >60 mL/min   Anion gap 11 5 - 15    Comment: Performed at [facility], [address] [place], [place] [address]  CBC     Status: Abnormal   Collection Time: 06/04/20  3:08 PM  Result Value Ref Range   WBC 8.0 4.0 - 10.5 K/uL   RBC 4.98 3.87 - 5.11 MIL/uL   Hemoglobin 15.3 (H) 12.0 - 15.0 g/dL   HCT 43.3 36 - 46 %   MCV 86.9 80.0 - 100.0 fL   MCH 30.7 26.0 - 34.0 pg   MCHC 35.3 30.0 - 36.0 g/dL   RDW 13.2 11.5 - 15.5 %   Platelets 378 150 - 400 K/uL   nRBC 0.0 0.0 - 0.2 %    Comment: Performed at [facility], [address] [place], [place] [address]  POCT Glucose (CBG)     Status: Abnormal   Collection Time: 06/08/20  1:34 PM  Result Value Ref Range   POC Glucose 297 (A) 70 - 99 mg/dl     PHQ2/9: Depression screen [facility] 2/9 06/08/2020 06/04/2020 06/02/2020 01/28/2020 11/18/2019  Decreased Interest 0 1 1 0 1  Down, Depressed, Hopeless 0 - 1 0 1  PHQ - 2 Score 0 1 2 0 2  Altered sleeping - _0 Tired, decreased  energy - _0 Change in appetite - 2 2 0 1  Feeling bad or failure about yourself  - 0 0 0 0  Trouble concentrating - 0 0 0 0  Moving slowly or fidgety/restless -  0 0 0 0  Suicidal thoughts - 0 0 0 0  PHQ-9 Score - _1 Difficult doing work/chores - Somewhat difficult Somewhat difficult - Somewhat difficult  Some recent data might be hidden    phq 9 is negative   Fall Risk: Fall Risk  06/08/2020 06/04/2020 06/02/2020 01/28/2020 11/18/2019  Falls in the past year? 0 0 0 0 0  Number falls in past yr: 0 0 0 0 0  Injury with Fall? 0 0 0 0 0  Risk for fall due to : - - - - -  Follow up - - - - -     Functional Status Survey: Is the patient deaf or have difficulty hearing?: Yes Does the patient have difficulty seeing, even when wearing glasses/contacts?: Yes Does the patient have difficulty concentrating, remembering, or making decisions?: No Does the patient have difficulty walking or climbing stairs?: No Does the patient have difficulty dressing or bathing?: No Does the patient have difficulty doing errands alone such as visiting a doctor's office or shopping?: No    Assessment & Plan  1. Uncontrolled type 2 diabetes mellitus with hyperglycemia (HCC)  - POCT Glucose (CBG) - Semaglutide,0.25 or 0.5MG/DOS, (OZEMPIC, 0.25 OR 0.5 MG/DOSE,) 2 MG/1.5ML SOPN; Inject 0.25-0.5 mg into the skin once a week. Start at 0.25 for 3 weeks after that go up to 0.5 mg and stay there  Dispense: 1.5 mL; Refill: 2  2. Abnormal finding on imaging of liver  We ordered a CT to be done, discussed results of imaging

## 2020-06-10 ENCOUNTER — Ambulatory Visit: Payer: BC Managed Care – PPO | Admitting: Family Medicine

## 2020-06-15 ENCOUNTER — Emergency Department (HOSPITAL_BASED_OUTPATIENT_CLINIC_OR_DEPARTMENT_OTHER): Payer: BC Managed Care – PPO

## 2020-06-15 ENCOUNTER — Other Ambulatory Visit: Payer: Self-pay

## 2020-06-15 ENCOUNTER — Encounter (HOSPITAL_BASED_OUTPATIENT_CLINIC_OR_DEPARTMENT_OTHER): Payer: Self-pay | Admitting: *Deleted

## 2020-06-15 ENCOUNTER — Emergency Department (HOSPITAL_BASED_OUTPATIENT_CLINIC_OR_DEPARTMENT_OTHER)
Admission: EM | Admit: 2020-06-15 | Discharge: 2020-06-15 | Disposition: A | Discharge status: Home / Self Care | Payer: BC Managed Care – PPO | Attending: Emergency Medicine | Admitting: Emergency Medicine

## 2020-06-15 ENCOUNTER — Ambulatory Visit: Payer: Self-pay | Admitting: *Deleted

## 2020-06-15 DIAGNOSIS — G43009 Migraine without aura, not intractable, without status migrainosus: Secondary | ICD-10-CM

## 2020-06-15 DIAGNOSIS — Z79899 Other long term (current) drug therapy: Secondary | ICD-10-CM | POA: Diagnosis not present

## 2020-06-15 DIAGNOSIS — Z794 Long term (current) use of insulin: Secondary | ICD-10-CM | POA: Insufficient documentation

## 2020-06-15 DIAGNOSIS — I10 Essential (primary) hypertension: Secondary | ICD-10-CM | POA: Insufficient documentation

## 2020-06-15 DIAGNOSIS — Z8673 Personal history of transient ischemic attack (TIA), and cerebral infarction without residual deficits: Secondary | ICD-10-CM | POA: Diagnosis not present

## 2020-06-15 DIAGNOSIS — R519 Headache, unspecified: Secondary | ICD-10-CM | POA: Diagnosis present

## 2020-06-15 DIAGNOSIS — E119 Type 2 diabetes mellitus without complications: Secondary | ICD-10-CM | POA: Diagnosis not present

## 2020-06-15 LAB — BASIC METABOLIC PANEL
Anion gap: 11 (ref 5–15)
BUN: 18 mg/dL (ref 6–20)
CO2: 28 mmol/L (ref 22–32)
Calcium: 9.6 mg/dL (ref 8.9–10.3)
Chloride: 96 mmol/L — ABNORMAL LOW (ref 98–111)
Creatinine, Ser: 0.96 mg/dL (ref 0.44–1.00)
GFR calc Af Amer: >60 mL/min (ref >60)
GFR calc non Af Amer: >60 mL/min (ref >60)
Glucose, Bld: 255 mg/dL — ABNORMAL HIGH (ref 70–99)
Potassium: 3.1 mmol/L — ABNORMAL LOW (ref 3.5–5.1)
Sodium: 135 mmol/L (ref 135–145)

## 2020-06-15 LAB — CBC WITH DIFFERENTIAL/PLATELET
Abs Immature Granulocytes: 0.02 10*3/uL (ref 0.00–0.07)
Basophils Absolute: 0 10*3/uL (ref 0.0–0.1)
Basophils Relative: 0 %
Eosinophils Absolute: 0.1 10*3/uL (ref 0.0–0.5)
Eosinophils Relative: 1 %
HCT: 41.5 % (ref 36.0–46.0)
Hemoglobin: 14 g/dL (ref 12.0–15.0)
Immature Granulocytes: 0 %
Lymphocytes Relative: 40 %
Lymphs Abs: 3.1 10*3/uL (ref 0.7–4.0)
MCH: 30.8 pg (ref 26.0–34.0)
MCHC: 33.7 g/dL (ref 30.0–36.0)
MCV: 91.2 fL (ref 80.0–100.0)
Monocytes Absolute: 0.7 10*3/uL (ref 0.1–1.0)
Monocytes Relative: 9 %
Neutro Abs: 3.9 10*3/uL (ref 1.7–7.7)
Neutrophils Relative %: 50 %
Platelets: 397 10*3/uL (ref 150–400)
RBC: 4.55 MIL/uL (ref 3.87–5.11)
RDW: 13.6 % (ref 11.5–15.5)
WBC: 7.7 10*3/uL (ref 4.0–10.5)
nRBC: 0 % (ref 0.0–0.2)

## 2020-06-15 MED ORDER — PROCHLORPERAZINE EDISYLATE 10 MG/2ML IJ SOLN
10.0000 mg | Freq: Once | INTRAMUSCULAR | Status: AC
  Administered 2020-06-15: 10 mg via INTRAVENOUS
  Filled 2020-06-15: qty 2

## 2020-06-15 MED ORDER — KETOROLAC TROMETHAMINE 15 MG/ML IJ SOLN
15.0000 mg | Freq: Once | INTRAMUSCULAR | Status: AC
  Administered 2020-06-15: 15 mg via INTRAVENOUS
  Filled 2020-06-15: qty 1

## 2020-06-15 MED ORDER — DIPHENHYDRAMINE HCL 50 MG/ML IJ SOLN
50.0000 mg | Freq: Once | INTRAMUSCULAR | Status: AC
  Administered 2020-06-15: 50 mg via INTRAVENOUS
  Filled 2020-06-15: qty 1

## 2020-06-15 MED ORDER — POTASSIUM CHLORIDE CRYS ER 20 MEQ PO TBCR
40.0000 meq | EXTENDED_RELEASE_TABLET | Freq: Once | ORAL | Status: AC
  Administered 2020-06-15: 40 meq via ORAL
  Filled 2020-06-15: qty 2

## 2020-06-15 MED ORDER — SODIUM CHLORIDE 0.9 % IV BOLUS
500.0000 mL | Freq: Once | INTRAVENOUS | Status: AC
  Administered 2020-06-15: 500 mL via INTRAVENOUS

## 2020-06-15 MED ORDER — SODIUM CHLORIDE 0.9 % IV BOLUS: 1000.0000 mL | Freq: Once | INTRAVENOUS | Status: DC

## 2020-06-15 NOTE — ED Triage Notes (Signed)
C/o " migraine " x 1 day hx of same

## 2020-06-15 NOTE — ED Provider Notes (Signed)
[facility] EMERGENCY DEPARTMENT Provider Note   CSN: [id number] Arrival date & time: 06/15/20  1448     History Chief Complaint  Patient presents with  . Migraine    Tiffany Nunez is a 49 y.o. female.  HPI 49 year old female with a past medical history significant for hypertension, migraines, obesity, new onset diabetes who presents to the emergency department today for evaluation of headache.  Patient reports history of migraines.  She reports waking up this morning with a migraine.  This is typical of her migraines.  She reports dull pressure in the front of her head.  She reports some nausea and photophobia.  Patient has taken no medications for symptoms prior to arrival.  Does have a neurology follow-up next month.  Patient also states that she was recently diagnosed with diabetes 2 weeks ago.  She was started on insulin insulin in Oz[place].  Patient reports that her blood sugars have been below 200.  Last A1c 2 weeks ago was 8.  Was supposed to follow-up with the ophthalmologist today however she forgot about her appointment and missed it.  Patient reports for the past week she was having some blurry vision.  She reports that she has to hold things up close to her glasses to be able to see.  Patient denies any double vision or loss of vision.  Patient is unsure if this is related to the medications.  Denies any facial asymmetry, ataxia, dysphagia.     Past Medical History:  Diagnosis Date  . Anemia   . Depressive disorder   . Diabetes mellitus, type II ([place])   . Esophageal reflux   . Essential hypertension, benign   . Genital herpes, unspecified   . Helicobacter pylori (H. pylori)   . Metabolic syndrome   . Migraine, unspecified, without mention of intractable migraine without mention of status migrainosus   . Nonspecific abnormal electrocardiogram (ECG) (EKG)   . Nonspecific abnormal results of thyroid function study   . Obesity, unspecified   . Other and  unspecified hyperlipidemia   . Unspecified vitamin D deficiency     Patient Active Problem List   Diagnosis Date Noted  . Mild episode of recurrent major depressive disorder ([place]) 05/28/2018  . History of cerebrovascular accident (CVA) involving cerebellum 06/12/2017  . Acquired autoimmune hypothyroidism 11/09/2016  . Vitamin D deficiency 05/03/2015  . Dyslipidemia 05/03/2015  . Gastro-esophageal reflux disease without esophagitis 05/03/2015  . Alopecia 05/03/2015  . Genital herpes 05/03/2015  . Dysmetabolic syndrome 64/40/3474  . Positive H. pylori test 05/03/2015  . Neuralgia neuritis, sciatic nerve 05/03/2015  . Migraine without aura and responsive to treatment 04/30/2014  . HTN (hypertension) 01/22/2014  . Morbid obesity ([place]) 01/22/2014  . Diabetes mellitus type 2 in obese ([place]) 01/22/2014  . Decreased cardiac ejection fraction 01/22/2014    Past Surgical History:  Procedure Laterality Date  . ABDOMINAL HYSTERECTOMY    . ANKLE SURGERY     right  . TUBAL LIGATION       OB History   No obstetric history on file.     Family History  Problem Relation Age of Onset  . Hypertension Father   . Hyperlipidemia Father   . Hyperlipidemia Mother   . Hypertension Mother   . Hypertension Paternal Aunt   . Cancer Maternal Grandmother        breast    Social History   Tobacco Use  . Smoking status: Never Smoker  . Smokeless tobacco: Never Used  Vaping Use  . Vaping Use: Never used  Substance Use Topics  . Alcohol use: No    Alcohol/week: 0.0 standard drinks  . Drug use: No    Home Medications Prior to Admission medications   Medication Sig Start Date End Date Taking? Authorizing Provider  Accu-Chek Softclix Lancets lancets SMARTSIG:Topical 06/02/20   [provider]  atorvastatin (LIPITOR) 40 MG tablet Take 1 tablet (40 mg total) by mouth every other day. 10/29/19   Steele Sizer, MD  blood glucose meter kit and supplies Dispense based on patient and  insurance preference. FSBS twice daily  (FOR ICD-10 E10.9, E11.9). 06/02/20   Steele Sizer, MD  dapagliflozin propanediol (FARXIGA) 10 MG TABS tablet Take 1 tablet (10 mg total) by mouth daily before breakfast. 06/02/20   Steele Sizer, MD  DULoxetine (CYMBALTA) 60 MG capsule Take 1 capsule (60 mg total) by mouth daily. 06/02/20   Steele Sizer, MD  fluticasone (FLONASE) 50 MCG/ACT nasal spray Place 2 sprays into both nostrils daily. 01/28/20   Steele Sizer, MD  hydrALAZINE (APRESOLINE) 50 MG tablet Take 1 tablet (50 mg total) by mouth 2 (two) times daily. 01/28/20   Steele Sizer, MD  ibuprofen (ADVIL) 800 MG tablet TAKE 1 TABLET BY MOUTH EVERY DAY AS NEEDED 04/29/20   Ancil Boozer, Drue Stager, MD  insulin degludec (TRESIBA FLEXTOUCH) 100 UNIT/ML FlexTouch Pen Inject 16 Units into the skin daily. Patient taking differently: Inject 30 Units into the skin daily.  06/02/20   Steele Sizer, MD  Insulin Pen Needle (NOVOFINE) 30G X 8 MM MISC Inject 10 each into the skin as needed. 06/02/20   Steele Sizer, MD  levothyroxine (SYNTHROID) 25 MCG tablet Take 1 tablet (25 mcg total) by mouth daily before breakfast. 06/03/20   Ancil Boozer, Drue Stager, MD  nadolol (CORGARD) 40 MG tablet Take 1 tablet (40 mg total) by mouth 2 (two) times daily. 06/02/20   Steele Sizer, MD  nortriptyline (PAMELOR) 10 MG capsule TAKE 1 CAPSULE BY MOUTH EVERYDAY AT BEDTIME 06/02/20   Steele Sizer, MD  Olmesartan-amLODIPine-HCTZ 40-10-25 MG TABS Take 1 tablet by mouth daily. 06/02/20   Sowles, Drue Stager, MD  ondansetron (ZOFRAN-ODT) 4 MG disintegrating tablet TAKE 1 TABLET BY MOUTH EVERY 8 HOURS AS NEEDED FOR NAUSEA AND VOMITING 06/02/20   Ancil Boozer, Drue Stager, MD  potassium chloride SA (KLOR-CON) 20 MEQ tablet Take 1 tablet (20 mEq total) by mouth 2 (two) times daily. 06/05/20   Steele Sizer, MD  Semaglutide,0.25 or 0.5MG /DOS, (OZEMPIC, 0.25 OR 0.5 MG/DOSE,) 2 MG/1.5ML SOPN Inject 0.25-0.5 mg into the skin once a week. Start at 0.25 for 3 weeks  after that go up to 0.5 mg and stay there 06/08/20   Steele Sizer, MD  Vitamin D, Ergocalciferol, (DRISDOL) 1.25 MG (50000 UNIT) CAPS capsule Take 1 capsule (50,000 Units total) by mouth every 7 (seven) days. 06/02/20   Steele Sizer, MD    Allergies    Imitrex [sumatriptan], Topamax [topiramate], and Ubrogepant  Review of Systems   Review of Systems  Constitutional: Negative for chills and fever.  HENT: Negative for congestion.   Eyes: Positive for photophobia and visual disturbance. Negative for discharge.  Respiratory: Negative for cough.   Gastrointestinal: Negative for nausea and vomiting.  Musculoskeletal: Negative for gait problem and myalgias.  Skin: Negative for rash.  Neurological: Positive for headaches. Negative for syncope, speech difficulty, weakness and numbness.  Psychiatric/Behavioral: Negative for confusion.    Physical Exam Updated Vital Signs BP (!) 101/42 (BP Location: Right Arm)   Pulse 65  Temp 98.5 F (36.9 C) (Oral)   Resp 18   Ht _0  (1.702 m)   Wt (!) 138.3 kg   SpO2 96%   BMI 47.77 kg/m   Physical Exam Vitals and nursing note reviewed.  Constitutional:      General: She is not in acute distress.    Appearance: She is well-developed. She is not ill-appearing or toxic-appearing.  HENT:     Head: Normocephalic and atraumatic.     Nose: Nose normal.     Mouth/Throat:     Mouth: Mucous membranes are moist.     Pharynx: Oropharynx is clear.  Eyes:     General: No scleral icterus.       Right eye: No discharge.        Left eye: No discharge.     Extraocular Movements: Extraocular movements intact.     Conjunctiva/sclera: Conjunctivae normal.     Pupils: Pupils are equal, round, and reactive to light.     Comments: Vision grossly intact.  Peripheral vision normal.  Pulmonary:     Effort: No respiratory distress.  Musculoskeletal:        General: Normal range of motion.     Cervical back: Normal range of motion.  Skin:    General:  Skin is warm and dry.     Capillary Refill: Capillary refill takes less than 2 seconds.     Coloration: Skin is not pale.  Neurological:     Mental Status: She is alert.     Comments: The patient is alert, attentive, and oriented x 3. Speech is clear. Cranial nerve II-VII grossly intact. Negative pronator drift. Sensation intact. Strength 5/5 in all extremities. Reflexes 2+ and symmetric at biceps, triceps, knees, and ankles. Rapid alternating movement and fine finger movements intact.    Psychiatric:        Behavior: Behavior normal.        Thought Content: Thought content normal.        Judgment: Judgment normal.     ED Results / Procedures / Treatments   Labs (all labs ordered are listed, but only abnormal results are displayed) Labs Reviewed  BASIC METABOLIC PANEL - Abnormal; Notable for the following components:      Result Value   Potassium 3.1 (*)    Chloride 96 (*)    Glucose, Bld 255 (*)    All other components within normal limits  CBC WITH DIFFERENTIAL/PLATELET    EKG None  Radiology CT Head Wo Contrast  Result Date: 06/15/2020 CLINICAL DATA:  Headache x1 day. EXAM: CT HEAD WITHOUT CONTRAST TECHNIQUE: Contiguous axial images were obtained from the base of the skull through the vertex without intravenous contrast. COMPARISON:  May 14, 2017 FINDINGS: Brain: No evidence of acute infarction, hemorrhage, hydrocephalus, extra-axial collection or mass lesion/mass effect. Vascular: No hyperdense vessel or unexpected calcification. Skull: Normal. Negative for fracture or focal lesion. Sinuses/Orbits: No acute finding. Other: None. IMPRESSION: No acute intracranial pathology. Electronically Signed   By: Virgina Norfolk M.D.   On: 06/15/2020 16:18    Procedures Procedures (including critical care time)  Medications Ordered in ED Medications  sodium chloride 0.9 % bolus 1,000 mL (has no administration in time range)  prochlorperazine (COMPAZINE) injection 10 mg (has no  administration in time range)  diphenhydrAMINE (BENADRYL) injection 50 mg (has no administration in time range)  ketorolac (TORADOL) 15 MG/ML injection 15 mg (has no administration in time range)    ED Course  I have reviewed  the triage vital signs and the nursing notes.  Pertinent labs & imaging results that were available during my care of the patient were reviewed by me and considered in my medical decision making (see chart for details).    MDM Rules/Calculators/A&P                          Pt HA treated and improved while in ED.  Presentation is like pts typical HA and non concerning for [facility], ICH, Meningitis, or temporal arteritis, acute angle glaucoma, dural venous sinus thrombosis, CVA, retinal occlusion. Pt is afebrile with no focal neuro deficits, nuchal rigidity, or change in vision.  Patient does report having some blurry vision that started last week but did not coincide with a headache today.  She did recently get diagnosed with diabetes.  Do feel the patient's blurry vision may be secondary to diabetic retinopathy.  She did miss her appointment with ophthalmologist today.  Will need ophthalmology follow-up.  CT scan of head today shows no acute intracranial normality.  Labs reviewed.  Glucose elevated but no signs of DKA.  I do not feel patient needs any emergent neurology or ophthalmology intervention at this time.  However did discuss it is important for her to follow-up with eye doctor.  Pt is to follow up with PCP to discuss prophylactic medication. Pt verbalizes understanding and is agreeable with plan to dc.   Final Clinical Impression(s) / ED Diagnoses Final diagnoses:  Migraine without aura and without status migrainosus, not intractable    Rx / DC Orders ED Discharge Orders    None       Aaron Edelman 06/15/20 1833    Little, Wenda Overland, MD 06/16/20 [phone]

## 2020-06-15 NOTE — Discharge Instructions (Addendum)
Drink plenty of fluids at home. This will help with your headache.  Please follow-up with your ophthalmologist for your blurry vision as well. Please follow up with pcp or neurologist.   Fortunately, your evaluation today is reassuring with no apparent emergent cause for your headache at this time. With that being said, it is VERY important that you monitor your symptoms at home. If you develop worsening headache, new fever, new neck stiffness, rash, weakness, numbness, trouble with your speech, trouble walking, new or worsening symptoms or any concerning symptoms, please return to the ED immediately.

## 2020-06-15 NOTE — Telephone Encounter (Signed)
Patient states she is new onset diabetic- she has been put on medications to lower her glucose and they have been helping. Patient states last week she started having blurred vision and now she is having troube seeing anything. She thinks it may be due to medications- because her symptoms are getting worse- advised ED- no appointment available in office.  Reason for Disposition  [1] Blurred vision or visual changes AND [2] present now AND [3] sudden onset or new (e.g., minutes, hours, days)  (Exception: seeing floaters / black specks OR previously diagnosed migraine headaches with same symptoms)  Answer Assessment - Initial Assessment Questions 1. DESCRIPTION: "What is the vision loss like? Describe it for me." (e.g., complete vision loss, blurred vision, double vision, floaters, etc.)     Blurry vision 2. LOCATION: "One or both eyes?" If one, ask: "Which eye?"     Both eyes 3. SEVERITY: "Can you see anything?" If Yes, ask: "What can you see?" (e.g., fine print)     Has to put objects close to eyes 4. ONSET: "When did this begin?" "Did it start suddenly or has this been gradual?"     Last week- gradual 5. PATTERN: "Does this come and go, or has it been constant since it started?"     cpnstant 6. PAIN: "Is there any pain in your eye(s)?"  (Scale 1-10; or mild, moderate, severe)     Causing heaches 7. CONTACTS-GLASSES: "Do you wear contacts or glasses?"     glasses 8. CAUSE: "What do you think is causing this visual problem?"     New onset diabetic 9. OTHER SYMPTOMS: "Do you have any other symptoms?" (e.g., confusion, headache, arm or leg weakness, speech problems)     headaches 10. PREGNANCY: "Is there any chance you are pregnant?" "When was your last menstrual period?"       n/a  Protocols used: [place]

## 2020-06-16 ENCOUNTER — Ambulatory Visit (HOSPITAL_COMMUNITY)
Admission: RE | Admit: 2020-06-16 | Discharge: 2020-06-16 | Disposition: A | Discharge status: Home / Self Care | Payer: BC Managed Care – PPO | Source: Ambulatory Visit | Attending: Family Medicine | Admitting: Family Medicine

## 2020-06-16 ENCOUNTER — Encounter (HOSPITAL_COMMUNITY): Payer: Self-pay

## 2020-06-16 DIAGNOSIS — R932 Abnormal findings on diagnostic imaging of liver and biliary tract: Secondary | ICD-10-CM | POA: Diagnosis present

## 2020-06-16 MED ORDER — IOHEXOL 9 MG/ML PO SOLN
ORAL | Status: AC
  Filled 2020-06-16: qty 1000

## 2020-06-16 MED ORDER — IOHEXOL 9 MG/ML PO SOLN
1000.0000 mL | ORAL | Status: AC
  Administered 2020-06-16: 1000 mL via ORAL

## 2020-06-16 MED ORDER — IOHEXOL 300 MG/ML  SOLN
100.0000 mL | Freq: Once | INTRAMUSCULAR | Status: AC | PRN
  Administered 2020-06-16: 100 mL via INTRAVENOUS

## 2020-06-17 ENCOUNTER — Encounter: Payer: Self-pay | Admitting: Family Medicine

## 2020-06-18 ENCOUNTER — Ambulatory Visit (INDEPENDENT_AMBULATORY_CARE_PROVIDER_SITE_OTHER): Payer: BC Managed Care – PPO | Admitting: Family Medicine

## 2020-06-18 ENCOUNTER — Other Ambulatory Visit: Payer: Self-pay

## 2020-06-18 ENCOUNTER — Encounter: Payer: Self-pay | Admitting: Family Medicine

## 2020-06-18 VITALS — BP 140/90 | HR 76 | Temp 98.0°F | Resp 16 | Ht 67.0 in | Wt 312.0 lb

## 2020-06-18 DIAGNOSIS — E669 Obesity, unspecified: Secondary | ICD-10-CM

## 2020-06-18 DIAGNOSIS — H538 Other visual disturbances: Secondary | ICD-10-CM

## 2020-06-18 DIAGNOSIS — E1169 Type 2 diabetes mellitus with other specified complication: Secondary | ICD-10-CM

## 2020-06-18 LAB — POCT GLUCOSE (DEVICE FOR HOME USE): Glucose Fasting, POC: 101 mg/dL — AB (ref 70–99)

## 2020-06-18 NOTE — Progress Notes (Signed)
Name: Tiffany Nunez   MRN: [id number]    DOB: 29-Jun-1971   Date:06/18/2020       Progress Note  Subjective  Chief Complaint  Chief Complaint  Patient presents with  . Blurred Vision    HPI  Blurred vision: she states symptoms started last 2 weeks her vision has blurred, she states it was like that before glucose started to normalize. She states glucose this am was 156, today in the office was 101. She does not see any scotomas, it is not a veil in front of her eyes, just looks blurred, she states has to hold her phone 2-3 inches from her face and without glasses to be able to see . 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 She contact her eye doctor on October the 13 th, 2021 , neurologist on October on 11 th, 2021 , we will try to get her seen sooner for evaluation of progressive blurred vision over the past two weeks   Headache: she states it is different now, she has been squinting her eyes and is straining her vision , CT head negative.   DMII: she is now on Xultophy 42 units glucose has been 156-160's now and is still titrating  dose , blurred vision does not seem to make sense since glucose is more controlled Explained it may have been that she got her rx glasses when her glucose was out of control and now that glucose is better her glasses are not the correct rx.   Patient Active Problem List   Diagnosis Date Noted  . Mild episode of recurrent major depressive disorder ([place]) 05/28/2018  . History of cerebrovascular accident (CVA) involving cerebellum 06/12/2017  . Acquired autoimmune hypothyroidism 11/09/2016  . Vitamin D deficiency 05/03/2015  . Dyslipidemia 05/03/2015  . Gastro-esophageal reflux disease without esophagitis 05/03/2015  . Alopecia 05/03/2015  . Genital herpes 05/03/2015  . Dysmetabolic syndrome 38/46/6599  . Positive H. pylori test 05/03/2015  . Neuralgia neuritis, sciatic nerve 05/03/2015  .  Migraine without aura and responsive to treatment 04/30/2014  . HTN (hypertension) 01/22/2014  . Morbid obesity ([place]) 01/22/2014  . Diabetes mellitus type 2 in obese ([place]) 01/22/2014  . Decreased cardiac ejection fraction 01/22/2014    Past Surgical History:  Procedure Laterality Date  . ABDOMINAL HYSTERECTOMY    . ANKLE SURGERY     right  . TUBAL LIGATION      Family History  Problem Relation Age of Onset  . Hypertension Father   . Hyperlipidemia Father   . Hyperlipidemia Mother   . Hypertension Mother   . Hypertension Paternal Aunt   . Cancer Maternal Grandmother        breast    Social History   Tobacco Use  . Smoking status: Never Smoker  . Smokeless tobacco: Never Used  Substance Use Topics  . Alcohol use: No    Alcohol/week: 0.0 standard drinks     Current Outpatient Medications:  .  Accu-Chek Softclix Lancets lancets, SMARTSIG:Topical, Disp: , Rfl:  .  atorvastatin (LIPITOR) 40 MG tablet, Take 1 tablet (40 mg total) by mouth every other day., Disp: 45 tablet, Rfl: 1 .  blood glucose meter kit and supplies, Dispense based on patient and insurance preference. FSBS twice daily  (FOR ICD-10 E10.9, E11.9)., Disp: 1 each, Rfl: 0 .  dapagliflozin propanediol (FARXIGA) 10 MG TABS tablet, Take 1 tablet (10 mg total)  by mouth daily before breakfast., Disp: 90 tablet, Rfl: 0 .  DULoxetine (CYMBALTA) 60 MG capsule, Take 1 capsule (60 mg total) by mouth daily., Disp: 90 capsule, Rfl: 1 .  fluticasone (FLONASE) 50 MCG/ACT nasal spray, Place 2 sprays into both nostrils daily., Disp: 16 g, Rfl: 6 .  hydrALAZINE (APRESOLINE) 50 MG tablet, Take 1 tablet (50 mg total) by mouth 2 (two) times daily., Disp: 180 tablet, Rfl: 1 .  ibuprofen (ADVIL) 800 MG tablet, TAKE 1 TABLET BY MOUTH EVERY DAY AS NEEDED, Disp: 90 tablet, Rfl: 0 .  insulin degludec (TRESIBA FLEXTOUCH) 100 UNIT/ML FlexTouch Pen, Inject 16 Units into the skin daily. (Patient taking differently: Inject 30 Units into the  skin daily. ), Disp: 15 mL, Rfl: 0 .  Insulin Pen Needle (NOVOFINE) 30G X 8 MM MISC, Inject 10 each into the skin as needed., Disp: 100 each, Rfl: 1 .  levothyroxine (SYNTHROID) 25 MCG tablet, Take 1 tablet (25 mcg total) by mouth daily before breakfast., Disp: 90 tablet, Rfl: 0 .  nadolol (CORGARD) 40 MG tablet, Take 1 tablet (40 mg total) by mouth 2 (two) times daily., Disp: 180 tablet, Rfl: 1 .  nortriptyline (PAMELOR) 10 MG capsule, TAKE 1 CAPSULE BY MOUTH EVERYDAY AT BEDTIME, Disp: 90 capsule, Rfl: 0 .  Olmesartan-amLODIPine-HCTZ 40-10-25 MG TABS, Take 1 tablet by mouth daily., Disp: 90 tablet, Rfl: 1 .  ondansetron (ZOFRAN-ODT) 4 MG disintegrating tablet, TAKE 1 TABLET BY MOUTH EVERY 8 HOURS AS NEEDED FOR NAUSEA AND VOMITING, Disp: 20 tablet, Rfl: 0 .  potassium chloride SA (KLOR-CON) 20 MEQ tablet, Take 1 tablet (20 mEq total) by mouth 2 (two) times daily., Disp: 6 tablet, Rfl: 3 .  Semaglutide,0.25 or 0.5MG/DOS, (OZEMPIC, 0.25 OR 0.5 MG/DOSE,) 2 MG/1.5ML SOPN, Inject 0.25-0.5 mg into the skin once a week. Start at 0.25 for 3 weeks after that go up to 0.5 mg and stay there, Disp: 1.5 mL, Rfl: 2 .  Vitamin D, Ergocalciferol, (DRISDOL) 1.25 MG (50000 UNIT) CAPS capsule, Take 1 capsule (50,000 Units total) by mouth every 7 (seven) days., Disp: 12 capsule, Rfl: 1  Allergies  Allergen Reactions  . Imitrex [Sumatriptan] Anaphylaxis  . Topamax [Topiramate] Other (See Comments)    Caused hand numbness   . Ubrogepant Other (See Comments)    [name] made her feel groggy the following day and bad taste in her month     I personally reviewed active problem list, medication list, allergies, family history, social history, health maintenance with the patient/caregiver today.   ROS  Ten systems reviewed and is negative except as mentioned in HPI   Objective  Vitals:   06/18/20 1413  BP: 140/90  Pulse: 76  Resp: 16  Temp: 98 F (36.7 C)  TempSrc: Oral  SpO2: 99%  Weight: (!) 312 lb  (141.5 kg)  Height: '5\' 7"'  (1.702 m)    Body mass index is 48.87 kg/m.  Physical Exam  Constitutional: Patient appears well-developed and well-nourished. Obese  No distress.  HEENT: head atraumatic, normocephalic, pupils equal and reactive to light,  neck supple Cardiovascular: Normal rate, regular rhythm and normal heart sounds.  No murmur heard. No BLE edema. Pulmonary/Chest: Effort normal and breath sounds normal. No respiratory distress. Abdominal: Soft.  There is no tenderness. Psychiatric: Patient has a normal mood and affect. behavior is normal. Judgment and thought content normal.  Recent Results (from the past 2160 hour(s))  POCT HgB A1C     Status: Abnormal   Collection Time:  06/02/20  8:08 AM  Result Value Ref Range   Hemoglobin A1C 10.6 (A) 4.0 - 5.6 %   HbA1c POC (<> result, manual entry)     HbA1c, POC (prediabetic range)     HbA1c, POC (controlled diabetic range)    TSH     Status: Abnormal   Collection Time: 06/02/20  8:33 AM  Result Value Ref Range   TSH 5.49 (H) mIU/L    Comment:           Reference Range .           > or = 20 Years  0.40-4.50 .                Pregnancy Ranges           First trimester    0.26-2.66           Second trimester   0.55-2.73           Third trimester    0.43-2.91   COMPLETE METABOLIC PANEL WITH GFR     Status: Abnormal   Collection Time: 06/02/20  8:33 AM  Result Value Ref Range   Glucose, Bld 539 (HH) 65 - 99 mg/dL    Comment: Verified by repeat analysis. Marland Kitchen .            Fasting reference interval . For someone without known diabetes, a glucose value >125 mg/dL indicates that they may have diabetes and this should be confirmed with a follow-up test. .    BUN 14 7 - 25 mg/dL   Creat 1.18 (H) 0.50 - 1.10 mg/dL   GFR, Est Non African American 54 (L) > OR = 60 mL/min/1.33m   GFR, Est African American 63 > OR = 60 mL/min/1.7100m  BUN/Creatinine Ratio 12 6 - 22 (calc)   Sodium 132 (L) 135 - 146 mmol/L   Potassium 4.1  3.5 - 5.3 mmol/L   Chloride 93 (L) 98 - 110 mmol/L   CO2 27 20 - 32 mmol/L   Calcium 10.2 8.6 - 10.2 mg/dL   Total Protein 7.9 6.1 - 8.1 g/dL   Albumin 4.2 3.6 - 5.1 g/dL   Globulin 3.7 1.9 - 3.7 g/dL (calc)   AG Ratio 1.1 1.0 - 2.5 (calc)   Total Bilirubin 0.6 0.2 - 1.2 mg/dL   Alkaline phosphatase (APISO) 141 (H) 31 - 125 U/L   AST 86 (H) 10 - 35 U/L   ALT 238 (H) 6 - 29 U/L  CBC with Differential/Platelet     Status: Abnormal   Collection Time: 06/02/20  8:33 AM  Result Value Ref Range   WBC 9.3 3.8 - 10.8 Thousand/uL   RBC 5.19 (H) 3.80 - 5.10 Million/uL   Hemoglobin 15.7 (H) 11.7 - 15.5 g/dL   HCT 46.9 (H) 35 - 45 %   MCV 90.4 80.0 - 100.0 fL   MCH 30.3 27.0 - 33.0 pg   MCHC 33.5 32.0 - 36.0 g/dL   RDW 12.6 11.0 - 15.0 %   Platelets 442 (H) 140 - 400 Thousand/uL   MPV 11.8 7.5 - 12.5 fL   Neutro Abs 5,301 1,500 - 7,800 cells/uL   Lymphs Abs 3,329 850 - 3,900 cells/uL   Absolute Monocytes 530 200 - 950 cells/uL   Eosinophils Absolute 93 15 - 500 cells/uL   Basophils Absolute 47 0 - 200 cells/uL   Neutrophils Relative % 57 %   Total Lymphocyte 35.8 %   Monocytes Relative 5.7 %  Eosinophils Relative 1.0 %   Basophils Relative 0.5 %  Lipase     Status: None   Collection Time: 06/02/20  8:33 AM  Result Value Ref Range   Lipase 41 7 - 60 U/L  H. pylori breath test     Status: None   Collection Time: 06/02/20  8:33 AM  Result Value Ref Range   H. pylori Breath Test NOT DETECTED NOT DETECT    Comment: . Antimicrobials, proton pump inhibitors, and bismuth preparations are known to suppress H. pylori, and  ingestion of these prior to H. pylori diagnostic testing may lead to false negative results. If clinically  indicated, the test may be repeated on a new specimen obtained two weeks after discontinuing treatment. However, a positive result is still clinically valid.   POCT Glucose (Device for Home Use)     Status: Abnormal   Collection Time: 06/04/20  1:38 PM   Result Value Ref Range   Glucose Fasting, POC 386 (A) 70 - 99 mg/dL   POC Glucose    POCT Urinalysis Dipstick     Status: Abnormal   Collection Time: 06/04/20  1:55 PM  Result Value Ref Range   Color, UA     Clarity, UA     Glucose, UA     Bilirubin, UA     Ketones, UA Positive     Comment: Small   Spec Grav, UA     Blood, UA     pH, UA     Protein, UA     Urobilinogen, UA     Nitrite, UA     Leukocytes, UA     Appearance     Odor    Glucose, capillary     Status: Abnormal   Collection Time: 06/04/20  2:58 PM  Result Value Ref Range   Glucose-Capillary 406 (H) 70 - 99 mg/dL    Comment: Glucose reference range applies only to samples taken after fasting for at least 8 hours.  Basic metabolic panel     Status: Abnormal   Collection Time: 06/04/20  3:08 PM  Result Value Ref Range   Sodium 133 (L) 135 - 145 mmol/L   Potassium 3.2 (L) 3.5 - 5.1 mmol/L   Chloride 95 (L) 98 - 111 mmol/L   CO2 27 22 - 32 mmol/L   Glucose, Bld 408 (H) 70 - 99 mg/dL    Comment: Glucose reference range applies only to samples taken after fasting for at least 8 hours.   BUN 23 (H) 6 - 20 mg/dL   Creatinine, Ser 0.98 0.44 - 1.00 mg/dL   Calcium 10.2 8.9 - 10.3 mg/dL   GFR calc non Af Amer >60 >60 mL/min   GFR calc Af Amer >60 >60 mL/min   Anion gap 11 5 - 15    Comment: Performed at [facility], [address] [place], [place] [address]  CBC     Status: Abnormal   Collection Time: 06/04/20  3:08 PM  Result Value Ref Range   WBC 8.0 4.0 - 10.5 K/uL   RBC 4.98 3.87 - 5.11 MIL/uL   Hemoglobin 15.3 (H) 12.0 - 15.0 g/dL   HCT 43.3 36 - 46 %   MCV 86.9 80.0 - 100.0 fL   MCH 30.7 26.0 - 34.0 pg   MCHC 35.3 30.0 - 36.0 g/dL   RDW 13.2 11.5 - 15.5 %   Platelets 378 150 - 400 K/uL   nRBC 0.0 0.0 - 0.2 %  Comment: Performed at [facility], [address] [place], [place] [address]  POCT Glucose (CBG)     Status: Abnormal   Collection Time: 06/08/20  1:34 PM  Result Value  Ref Range   POC Glucose 297 (A) 70 - 99 mg/dl  CBC with Differential     Status: None   Collection Time: 06/15/20  4:41 PM  Result Value Ref Range   WBC 7.7 4.0 - 10.5 K/uL   RBC 4.55 3.87 - 5.11 MIL/uL   Hemoglobin 14.0 12.0 - 15.0 g/dL   HCT 41.5 36 - 46 %   MCV 91.2 80.0 - 100.0 fL   MCH 30.8 26.0 - 34.0 pg   MCHC 33.7 30.0 - 36.0 g/dL   RDW 13.6 11.5 - 15.5 %   Platelets 397 150 - 400 K/uL   nRBC 0.0 0.0 - 0.2 %   Neutrophils Relative % 50 %   Neutro Abs 3.9 1.7 - 7.7 K/uL   Lymphocytes Relative 40 %   Lymphs Abs 3.1 0.7 - 4.0 K/uL   Monocytes Relative 9 %   Monocytes Absolute 0.7 0 - 1 K/uL   Eosinophils Relative 1 %   Eosinophils Absolute 0.1 0 - 0 K/uL   Basophils Relative 0 %   Basophils Absolute 0.0 0 - 0 K/uL   Immature Granulocytes 0 %   Abs Immature Granulocytes 0.02 0.00 - 0.07 K/uL    Comment: Performed at [facility], [address] [place], [place] [address]  Basic metabolic panel     Status: Abnormal   Collection Time: 06/15/20  4:41 PM  Result Value Ref Range   Sodium 135 135 - 145 mmol/L   Potassium 3.1 (L) 3.5 - 5.1 mmol/L   Chloride 96 (L) 98 - 111 mmol/L   CO2 28 22 - 32 mmol/L   Glucose, Bld 255 (H) 70 - 99 mg/dL    Comment: Glucose reference range applies only to samples taken after fasting for at least 8 hours.   BUN 18 6 - 20 mg/dL   Creatinine, Ser 0.96 0.44 - 1.00 mg/dL   Calcium 9.6 8.9 - 10.3 mg/dL   GFR calc non Af Amer >60 >60 mL/min   GFR calc Af Amer >60 >60 mL/min   Anion gap 11 5 - 15    Comment: Performed at [facility], [address] [place], [place] [address]  POCT Glucose (Device for Home Use)     Status: Abnormal   Collection Time: 06/18/20  2:37 PM  Result Value Ref Range   Glucose Fasting, POC 101 (A) 70 - 99 mg/dL   POC Glucose       PHQ2/9: Depression screen [facility] 2/9 06/18/2020 06/18/2020 06/08/2020 06/04/2020 06/02/2020  Decreased Interest 1 1 0 1 1  Down, Depressed, Hopeless 1 1 0 - 1  PHQ - 2  Score 2 2 0 1 2  Altered sleeping 1 - - 1 1  Tired, decreased energy 1 - - 2 2  Change in appetite 1 - - 2 2  Feeling bad or failure about yourself  0 - - 0 0  Trouble concentrating 0 - - 0 0  Moving slowly or fidgety/restless 0 - - 0 0  Suicidal thoughts 0 - - 0 0  PHQ-9 Score 5 - - 6 7  Difficult doing work/chores Somewhat difficult - - Somewhat difficult Somewhat difficult  Some recent data might be hidden    phq 9 is negative  Fall Risk: Fall Risk  06/18/2020 06/08/2020 06/04/2020 06/02/2020 01/28/2020  Falls in the past year? 0 0 0 0 0  Number falls in past yr: 0 0 0 0 0  Injury with Fall? 0 0 0 0 0  Risk for fall due to : - - - - -  Follow up - - - - -     Functional Status Survey: Is the patient deaf or have difficulty hearing?: Yes Does the patient have difficulty seeing, even when wearing glasses/contacts?: Yes Does the patient have difficulty concentrating, remembering, or making decisions?: No Does the patient have difficulty walking or climbing stairs?: No Does the patient have difficulty dressing or bathing?: No Does the patient have difficulty doing errands alone such as visiting a doctor's office or shopping?: Yes    Assessment & Plan  1. Diabetes mellitus type 2 in obese ([facility])  - POCT Glucose (Device for Home Use)  2. Blurred vision   we called [facility] and they moved her appointment to October 8 th, 2021 and patient notified to go to [facility] if worsening of symptoms

## 2020-06-24 NOTE — Progress Notes (Signed)
NEUROLOGY CONSULTATION NOTE  DAY GREB MRN: [id number] DOB: 03-27-1971  Referring provider: Steele Sizer, MD Primary care provider: Steele Sizer, MD  Reason for consult:  migraines  HISTORY OF PRESENT ILLNESS: Tiffany Nunez is a 49 year old right-handed female with diabetes, HTN, metabolic syndrome who presents for migraines.  History supplemented by referring provider's note.  She has had migraines since her 20s but worse over past year.  They are severe throbbing left or right frontal headache.  They may be preceded by zigzag lines in vision (sometimes with headache).  They are associated with nausea, photophobia, phonophobia, osmophobia, sometimes vomiting.  No numbness or weakness.  It may last 4 days where she is stuck in the bed, occurring at least once a month.  Others may last a day, occurring twice a week.  Triggers may include any smell (cologne, perfume) or change in barometric pressure.  No relieving factors.    She was diagnosed with diabetes in September.  She presented to the ED on 06/15/2020 for headache and increased blurred vision.  She missed her appointment with the ophthalmologist.  CT head without contrast personally reviewed was unremarkable.  She was treated with a headache cocktail of Toradol/Compazine/Benadryl.  Since treatment of diabetes, eye sight has improved.  MRI of brain without contrast from 05/14/2017 to evaluate headache following head trauma was personally reviewed and showed small chronic right cerebellar infarct but otherwise unremarkable.  Current NSAIDS/analgesics:  Ibuprofen (takes 3 days a week on average) Current triptans:  none Current ergotamine:  none Current anti-emetic:  Zofran ODT 65m Current muscle relaxants:  none Current anti-anxiolytic:  none Current sleep aide:  none Current Antihypertensive medications:  Nadolol, olmesartan-amlodipine-HCTZ, hydralazine  Current Antidepressant medications:  Nortriptyline 126mat bedtime  (several months ago), Cymbalta 6056maily Current Anticonvulsant medications:  none Current anti-CGRP:  none Current Vitamins/Herbal/Supplements:  none Current Antihistamines/Decongestants:  none Other therapy:  none Hormone/birth control:  none  Past NSAIDS/analgesics:  Fioricet, Mobic, Excedrin, Tylenol, Stadol Past abortive triptans:  Sumatriptan (anaphylaxis) Past abortive ergotamine:  none Past muscle relaxants:  none Past anti-emetic:  promethazine Past antihypertensive medications:  HCTZ, losartan Past antidepressant medications:  none Past anticonvulsant medications:  topiramate (paresthesias) Past anti-CGRP:  Aimovig 73m29mbrelvy Past vitamins/Herbal/Supplements:  none Past antihistamines/decongestants:  none Other past therapies:  none  Caffeine:  No coffee.  Stopped soda once diagnosed with diabetes.   Diet:  Improved water intake. Modified diet for diabetes.   Exercise:  Trying to start. Depression:  some; Anxiety:  no Other pain:  no Sleep hygiene:  Poor.  Trouble staying asleep and falling asleep Family history of headache:  No   PAST MEDICAL HISTORY: Past Medical History:  Diagnosis Date  . Anemia   . Depressive disorder   . Diabetes mellitus, type II (HCC)[place]. Esophageal reflux   . Essential hypertension, benign   . Genital herpes, unspecified   . Helicobacter pylori (H. pylori)   . Metabolic syndrome   . Migraine, unspecified, without mention of intractable migraine without mention of status migrainosus   . Nonspecific abnormal electrocardiogram (ECG) (EKG)   . Nonspecific abnormal results of thyroid function study   . Obesity, unspecified   . Other and unspecified hyperlipidemia   . Unspecified vitamin D deficiency     PAST SURGICAL HISTORY: Past Surgical History:  Procedure Laterality Date  . ABDOMINAL HYSTERECTOMY    . ANKLE SURGERY     right  . TUBAL LIGATION  MEDICATIONS: Current Outpatient Medications on File Prior to Visit    Medication Sig Dispense Refill  . Accu-Chek Softclix Lancets lancets SMARTSIG:Topical    . atorvastatin (LIPITOR) 40 MG tablet Take 1 tablet (40 mg total) by mouth every other day. 45 tablet 1  . blood glucose meter kit and supplies Dispense based on patient and insurance preference. FSBS twice daily  (FOR ICD-10 E10.9, E11.9). 1 each 0  . dapagliflozin propanediol (FARXIGA) 10 MG TABS tablet Take 1 tablet (10 mg total) by mouth daily before breakfast. 90 tablet 0  . DULoxetine (CYMBALTA) 60 MG capsule Take 1 capsule (60 mg total) by mouth daily. 90 capsule 1  . fluticasone (FLONASE) 50 MCG/ACT nasal spray Place 2 sprays into both nostrils daily. 16 g 6  . hydrALAZINE (APRESOLINE) 50 MG tablet Take 1 tablet (50 mg total) by mouth 2 (two) times daily. 180 tablet 1  . ibuprofen (ADVIL) 800 MG tablet TAKE 1 TABLET BY MOUTH EVERY DAY AS NEEDED 90 tablet 0  . insulin degludec (TRESIBA FLEXTOUCH) 100 UNIT/ML FlexTouch Pen Inject 16 Units into the skin daily. (Patient taking differently: Inject 30 Units into the skin daily. ) 15 mL 0  . Insulin Pen Needle (NOVOFINE) 30G X 8 MM MISC Inject 10 each into the skin as needed. 100 each 1  . levothyroxine (SYNTHROID) 25 MCG tablet Take 1 tablet (25 mcg total) by mouth daily before breakfast. 90 tablet 0  . nadolol (CORGARD) 40 MG tablet Take 1 tablet (40 mg total) by mouth 2 (two) times daily. 180 tablet 1  . nortriptyline (PAMELOR) 10 MG capsule TAKE 1 CAPSULE BY MOUTH EVERYDAY AT BEDTIME 90 capsule 0  . Olmesartan-amLODIPine-HCTZ 40-10-25 MG TABS Take 1 tablet by mouth daily. 90 tablet 1  . ondansetron (ZOFRAN-ODT) 4 MG disintegrating tablet TAKE 1 TABLET BY MOUTH EVERY 8 HOURS AS NEEDED FOR NAUSEA AND VOMITING 20 tablet 0  . potassium chloride SA (KLOR-CON) 20 MEQ tablet Take 1 tablet (20 mEq total) by mouth 2 (two) times daily. 6 tablet 3  . Semaglutide,0.25 or 0.5MG/DOS, (OZEMPIC, 0.25 OR 0.5 MG/DOSE,) 2 MG/1.5ML SOPN Inject 0.25-0.5 mg into the skin once  a week. Start at 0.25 for 3 weeks after that go up to 0.5 mg and stay there 1.5 mL 2  . Vitamin D, Ergocalciferol, (DRISDOL) 1.25 MG (50000 UNIT) CAPS capsule Take 1 capsule (50,000 Units total) by mouth every 7 (seven) days. 12 capsule 1   No current facility-administered medications on file prior to visit.    ALLERGIES: Allergies  Allergen Reactions  . Imitrex [Sumatriptan] Anaphylaxis  . Topamax [Topiramate] Other (See Comments)    Caused hand numbness   . Ubrogepant Other (See Comments)    [name] made her feel groggy the following day and bad taste in her month     FAMILY HISTORY: Family History  Problem Relation Age of Onset  . Hypertension Father   . Hyperlipidemia Father   . Hyperlipidemia Mother   . Hypertension Mother   . Hypertension Paternal Aunt   . Cancer Maternal Grandmother        breast    SOCIAL HISTORY: Social History   Socioeconomic History  . Marital status: Divorced    Spouse name: Not on file  . Number of children: 3  . Years of education: Not on file  . Highest education level: Associate degree: occupational, Hotel manager, or vocational program  Occupational History    Comment: [employer]  Tobacco Use  . Smoking status:  Never Smoker  . Smokeless tobacco: Never Used  Vaping Use  . Vaping Use: Never used  Substance and Sexual Activity  . Alcohol use: No    Alcohol/week: 0.0 standard drinks  . Drug use: No  . Sexual activity: Not on file  Other Topics Concern  . Not on file  Social History Narrative  . Not on file   Social Determinants of Health   Financial Resource Strain: Medium Risk  . Difficulty of Paying Living Expenses: Somewhat hard  Food Insecurity: Food Insecurity Present  . Worried About Charity fundraiser in the Last Year: Sometimes true  . Ran Out of Food in the Last Year: Sometimes true  Transportation Needs: No Transportation Needs  . Lack of Transportation (Medical): No  . Lack of Transportation (Non-Medical): No   Physical Activity: Inactive  . Days of Exercise per Week: 0 days  . Minutes of Exercise per Session: 0 min  Stress: Stress Concern Present  . Feeling of Stress : Very much  Social Connections: Moderately Isolated  . Frequency of Communication with Friends and Family: More than three times a week  . Frequency of Social Gatherings with Friends and Family: Once a week  . Attends Religious Services: 1 to 4 times per year  . Active Member of Clubs or Organizations: No  . Attends Archivist Meetings: Never  . Marital Status: Divorced  Human resources officer Violence: Not At Risk  . Fear of Current or Ex-Partner: No  . Emotionally Abused: No  . Physically Abused: No  . Sexually Abused: No    REVIEW OF SYSTEMS: Constitutional: No fevers, chills, or sweats, no generalized fatigue, change in appetite Eyes: No visual changes, double vision, eye pain Ear, nose and throat: No hearing loss, ear pain, nasal congestion, sore throat Cardiovascular: No chest pain, palpitations Respiratory:  No shortness of breath at rest or with exertion, wheezes GastrointestinaI: No nausea, vomiting, diarrhea, abdominal pain, fecal incontinence Genitourinary:  No dysuria, urinary retention or frequency Musculoskeletal:  No neck pain, back pain Integumentary: No rash, pruritus, skin lesions Neurological: as above Psychiatric: No depression, insomnia, anxiety Endocrine: No palpitations, fatigue, diaphoresis, mood swings, change in appetite, change in weight, increased thirst Hematologic/Lymphatic:  No purpura, petechiae. Allergic/Immunologic: no itchy/runny eyes, nasal congestion, recent allergic reactions, rashes  PHYSICAL EXAM: Blood pressure 132/88, pulse 72, height _0  (1.702 m), weight (!) 308 lb 3.2 oz (139.8 kg), SpO2 96 %. General: No acute distress.  Patient appears well-groomed.  Head:  Normocephalic/atraumatic Eyes:  fundi examined but not visualized Neck: supple, no paraspinal tenderness,  full range of motion Back: No paraspinal tenderness Heart: regular rate and rhythm Lungs: Clear to auscultation bilaterally. Vascular: No carotid bruits. Neurological Exam: Mental status: alert and oriented to person, place, and time, recent and remote memory intact, fund of knowledge intact, attention and concentration intact, speech fluent and not dysarthric, language intact. Cranial nerves: CN I: not tested CN II: pupils equal, round and reactive to light, visual fields intact CN III, IV, VI:  full range of motion, no nystagmus, no ptosis CN V: facial sensation intact CN VII: upper and lower face symmetric CN VIII: hearing intact CN IX, X: gag intact, uvula midline CN XI: sternocleidomastoid and trapezius muscles intact CN XII: tongue midline Bulk & Tone: normal, no fasciculations. Motor:  5/5 throughout  Sensation:  Pinprick and vibration sensation intact. Deep Tendon Reflexes:  2+ throughout, toes downgoing.  Finger to nose testing:  Without dysmetria.  Heel to shin:  Without dysmetria.  Gait:  Normal station and stride.  Able to turn and tandem walk. Romberg negative.  IMPRESSION: Chronic migraine without aura, without status migrainosus, not intractable Migraine with aura, without status migrainosus, intractable Type 2 diabetes mellitus, newly diagnosed.  PLAN: Migraines very likely may have been aggravated by diabetes and will hopefully improve with treatment for the diabetes. 1.  Will increase nortriptyline to 64m at bedtime.  If no improvement in 6 weeks, we can increase to 526mat bedtime. 2.  For abortive therapy, will have her try Nurtec.  If effective, she will contact me for script.  If ineffective, consider Sprix NS. 3.  Stop ibuprofen.  Limit use of pain relievers to no more than 2 days out of week to prevent risk of rebound or medication-overuse headache. 4.  Zofran for nausea 5.  Keep headache diary 6.  Exercise, sleep hygiene, hydration, diet, hydration  discussed 7.  Follow up in 4 to 6 months.  Thank you for allowing me to take part in the care of this patient.  AdMetta ClinesDO  CC: KrSteele SizerMD

## 2020-06-26 LAB — HM DIABETES EYE EXAM

## 2020-06-29 ENCOUNTER — Encounter: Payer: Self-pay | Admitting: Neurology

## 2020-06-29 ENCOUNTER — Ambulatory Visit (INDEPENDENT_AMBULATORY_CARE_PROVIDER_SITE_OTHER): Payer: BC Managed Care – PPO | Admitting: Neurology

## 2020-06-29 ENCOUNTER — Other Ambulatory Visit: Payer: Self-pay

## 2020-06-29 VITALS — BP 132/88 | HR 72 | Ht 67.0 in | Wt 308.2 lb

## 2020-06-29 DIAGNOSIS — E669 Obesity, unspecified: Secondary | ICD-10-CM

## 2020-06-29 DIAGNOSIS — E1169 Type 2 diabetes mellitus with other specified complication: Secondary | ICD-10-CM

## 2020-06-29 DIAGNOSIS — G43709 Chronic migraine without aura, not intractable, without status migrainosus: Secondary | ICD-10-CM | POA: Diagnosis not present

## 2020-06-29 DIAGNOSIS — G43119 Migraine with aura, intractable, without status migrainosus: Secondary | ICD-10-CM | POA: Diagnosis not present

## 2020-06-29 MED ORDER — NORTRIPTYLINE HCL 25 MG PO CAPS: 25.0000 mg | ORAL_CAPSULE | Freq: Every day | ORAL | 5 refills | Status: DC

## 2020-06-29 NOTE — Patient Instructions (Addendum)
°  1. Increase nortriptyline to 25mg  at bedtime.  If headaches not sufficiently improved in 46weeks with update and we can increase dose if needed. 2. Take Nurtec 1 tablet at earliest onset of headache.  Maximum 1 tablet in 24 hours.  If effective, contact me for script.  Otherwise, we will try the Sprix nasal spray. 3. Continue ondansetron (Zofran) for nausea. 4. Stop ibuprofen 5. Limit use of pain relievers to no more than 2 days out of the week.  These medications include acetaminophen, NSAIDs (ibuprofen/Advil/Motrin, naproxen/Aleve, triptans (Imitrex/sumatriptan), Excedrin, and narcotics.  This will help reduce risk of rebound headaches. 6. Be aware of common food triggers:  - Caffeine:  coffee, black tea, cola, Mt. Dew  - Chocolate  - Dairy:  aged cheeses (brie, blue, cheddar, gouda, [place], provolone, [place], Swiss, etc), chocolate milk, buttermilk, sour cream, limit eggs and yogurt  - Nuts, peanut butter  - Alcohol  - Cereals/grains:  FRESH breads (fresh bagels, sourdough, doughnuts), yeast productions  - Processed/canned/aged/cured meats (pre-packaged deli meats, hotdogs)  - MSG/glutamate:  soy sauce, flavor enhancer, pickled/preserved/marinated foods  - Sweeteners:  aspartame (Equal, Nutrasweet).  Sugar and Splenda are okay  - Vegetables:  legumes (lima beans, lentils, snow peas, fava beans, pinto peans, peas, garbanzo beans), sauerkraut, onions, olives, pickles  - Fruit:  avocados, bananas, citrus fruit (orange, lemon, grapefruit), mango  - Other:  Frozen meals, macaroni and cheese 7. Routine exercise 8. Stay adequately hydrated (aim for 64 oz water daily) 9. Keep headache diary 10. Maintain proper stress management 11. Maintain proper sleep hygiene 12. Do not skip meals 13. Consider supplements:  magnesium citrate 400mg  daily, riboflavin 400mg  daily, coenzyme Q10 100mg  three times daily. 14. Follow up in 4 to 6 months.

## 2020-06-30 NOTE — Progress Notes (Signed)
Name: Tiffany Nunez   MRN: [id number]    DOB: [birth year]   Date:07/01/2020       Progress Note  Subjective  Chief Complaint  Chief Complaint  Patient presents with  . Follow-up  . Diabetes    HPI   Headache: she went to Dr. Geanie Logan at [facility] on 10/11, she is now on higher dose of Nortriptyline 25 mg at night , she was given a sample of Nurteq to take prn. She states since she saw me 06/18/2020 headaches has not been as intense. About two episodes per week, but able to function , she is due to go back to work tomorrow. She was having blurred vision and saw Ophthalmologist and was given reassurance, secondary to diabetes. She states vision is not blurred at this time, but she states still has to hold things close to her face to see. She asked about extending time off. Explained I will need to obtain notes from eye doctor and follow there recommendations. Based on her other medication problems she can return to work tomorrow without restrictions, if necessary she will return to work tomorrow with restrictions ( secondary to vision - if advised by ophthalmologist )   DMII: she is now on Xultophy 46  units glucose has been 120's-130's over the past few days. She is cutting down on carbohydrates, she states still feels hungry misses carbs, but polyuria and polydipsia has improved   Patient Active Problem List   Diagnosis Date Noted  . Mild episode of recurrent major depressive disorder ([place]) 05/28/2018  . History of cerebrovascular accident (CVA) involving cerebellum 06/12/2017  . Acquired autoimmune hypothyroidism 11/09/2016  . Vitamin D deficiency 05/03/2015  . Dyslipidemia 05/03/2015  . Gastro-esophageal reflux disease without esophagitis 05/03/2015  . Alopecia 05/03/2015  . Genital herpes 05/03/2015  . Dysmetabolic syndrome 78/29/5621  . Positive H. pylori test 05/03/2015  . Neuralgia neuritis, sciatic nerve 05/03/2015  . Migraine without aura and responsive to treatment  04/30/2014  . HTN (hypertension) 01/22/2014  . Morbid obesity ([place]) 01/22/2014  . Diabetes mellitus type 2 in obese ([employer]) 01/22/2014  . Decreased cardiac ejection fraction 01/22/2014    Past Surgical History:  Procedure Laterality Date  . ABDOMINAL HYSTERECTOMY    . ANKLE SURGERY     right  . TUBAL LIGATION      Family History  Problem Relation Age of Onset  . Hypertension Father   . Hyperlipidemia Father   . Hyperlipidemia Mother   . Hypertension Mother   . Hypertension Paternal Aunt   . Cancer Maternal Grandmother        breast    Social History   Tobacco Use  . Smoking status: Never Smoker  . Smokeless tobacco: Never Used  Substance Use Topics  . Alcohol use: No    Alcohol/week: 0.0 standard drinks     Current Outpatient Medications:  .  Accu-Chek Softclix Lancets lancets, SMARTSIG:Topical, Disp: , Rfl:  .  atorvastatin (LIPITOR) 40 MG tablet, Take 1 tablet (40 mg total) by mouth every other day., Disp: 45 tablet, Rfl: 1 .  blood glucose meter kit and supplies, Dispense based on patient and insurance preference. FSBS twice daily  (FOR ICD-10 E10.9, E11.9)., Disp: 1 each, Rfl: 0 .  dapagliflozin propanediol (FARXIGA) 10 MG TABS tablet, Take 1 tablet (10 mg total) by mouth daily before breakfast., Disp: 90 tablet, Rfl: 0 .  DULoxetine (CYMBALTA) 60 MG capsule, Take 1 capsule (60 mg total) by mouth daily., Disp: 90 capsule,  Rfl: 1 .  fluticasone (FLONASE) 50 MCG/ACT nasal spray, Place 2 sprays into both nostrils daily., Disp: 16 g, Rfl: 6 .  hydrALAZINE (APRESOLINE) 50 MG tablet, Take 1 tablet (50 mg total) by mouth 2 (two) times daily., Disp: 180 tablet, Rfl: 1 .  ibuprofen (ADVIL) 800 MG tablet, TAKE 1 TABLET BY MOUTH EVERY DAY AS NEEDED, Disp: 90 tablet, Rfl: 0 .  insulin degludec (TRESIBA FLEXTOUCH) 100 UNIT/ML FlexTouch Pen, Inject 16 Units into the skin daily. (Patient taking differently: Inject 30 Units into the skin daily. ), Disp: 15 mL, Rfl: 0 .  Insulin Pen  Needle (NOVOFINE) 30G X 8 MM MISC, Inject 10 each into the skin as needed., Disp: 100 each, Rfl: 1 .  levothyroxine (SYNTHROID) 25 MCG tablet, Take 1 tablet (25 mcg total) by mouth daily before breakfast., Disp: 90 tablet, Rfl: 0 .  nadolol (CORGARD) 40 MG tablet, Take 1 tablet (40 mg total) by mouth 2 (two) times daily., Disp: 180 tablet, Rfl: 1 .  nortriptyline (PAMELOR) 25 MG capsule, Take 1 capsule (25 mg total) by mouth at bedtime., Disp: 30 capsule, Rfl: 5 .  Olmesartan-amLODIPine-HCTZ 40-10-25 MG TABS, Take 1 tablet by mouth daily., Disp: 90 tablet, Rfl: 1 .  ondansetron (ZOFRAN-ODT) 4 MG disintegrating tablet, TAKE 1 TABLET BY MOUTH EVERY 8 HOURS AS NEEDED FOR NAUSEA AND VOMITING, Disp: 20 tablet, Rfl: 0 .  Semaglutide,0.25 or 0.5MG/DOS, (OZEMPIC, 0.25 OR 0.5 MG/DOSE,) 2 MG/1.5ML SOPN, Inject 0.25-0.5 mg into the skin once a week. Start at 0.25 for 3 weeks after that go up to 0.5 mg and stay there, Disp: 1.5 mL, Rfl: 2 .  Vitamin D, Ergocalciferol, (DRISDOL) 1.25 MG (50000 UNIT) CAPS capsule, Take 1 capsule (50,000 Units total) by mouth every 7 (seven) days., Disp: 12 capsule, Rfl: 1  Allergies  Allergen Reactions  . Imitrex [Sumatriptan] Anaphylaxis  . Topamax [Topiramate] Other (See Comments)    Caused hand numbness   . Ubrogepant Other (See Comments)    [name] made her feel groggy the following day and bad taste in her month     I personally reviewed active problem list, medication list, allergies with the patient/caregiver today.   ROS  Ten systems reviewed and is negative except as mentioned in HPI   Objective  Vitals:   07/01/20 1154  BP: 128/78  Pulse: 81  Resp: 18  Temp: 98.5 F (36.9 C)  TempSrc: Oral  SpO2: 97%  Weight: (!) 308 lb 8 oz (139.9 kg)  Height: _0  (1.702 m)    Body mass index is 48.32 kg/m.  Physical Exam  Constitutional: Patient appears well-developed and well-nourished. Obese No distress.  HEENT: head atraumatic, normocephalic, pupils  equal and reactive to light, neck supple Cardiovascular: Normal rate, regular rhythm and normal heart sounds.  No murmur heard. No BLE edema. Pulmonary/Chest: Effort normal and breath sounds normal. No respiratory distress. Abdominal: Soft.  There is no tenderness. Psychiatric: Patient has a normal mood and affect. behavior is normal. Judgment and thought content normal.  Recent Results (from the past 2160 hour(s))  POCT HgB A1C     Status: Abnormal   Collection Time: 06/02/20  8:08 AM  Result Value Ref Range   Hemoglobin A1C 10.6 (A) 4.0 - 5.6 %   HbA1c POC (<> result, manual entry)     HbA1c, POC (prediabetic range)     HbA1c, POC (controlled diabetic range)    TSH     Status: Abnormal   Collection Time: 06/02/20  8:33 AM  Result Value Ref Range   TSH 5.49 (H) mIU/L    Comment:           Reference Range .           > or = 20 Years  0.40-4.50 .                Pregnancy Ranges           First trimester    0.26-2.66           Second trimester   0.55-2.73           Third trimester    0.43-2.91   COMPLETE METABOLIC PANEL WITH GFR     Status: Abnormal   Collection Time: 06/02/20  8:33 AM  Result Value Ref Range   Glucose, Bld 539 (HH) 65 - 99 mg/dL    Comment: Verified by repeat analysis. Marland Kitchen .            Fasting reference interval . For someone without known diabetes, a glucose value >125 mg/dL indicates that they may have diabetes and this should be confirmed with a follow-up test. .    BUN 14 7 - 25 mg/dL   Creat 1.18 (H) 0.50 - 1.10 mg/dL   GFR, Est Non African American 54 (L) > OR = 60 mL/min/1.27m   GFR, Est African American 63 > OR = 60 mL/min/1.732m  BUN/Creatinine Ratio 12 6 - 22 (calc)   Sodium 132 (L) 135 - 146 mmol/L   Potassium 4.1 3.5 - 5.3 mmol/L   Chloride 93 (L) 98 - 110 mmol/L   CO2 27 20 - 32 mmol/L   Calcium 10.2 8.6 - 10.2 mg/dL   Total Protein 7.9 6.1 - 8.1 g/dL   Albumin 4.2 3.6 - 5.1 g/dL   Globulin 3.7 1.9 - 3.7 g/dL (calc)   AG Ratio 1.1  1.0 - 2.5 (calc)   Total Bilirubin 0.6 0.2 - 1.2 mg/dL   Alkaline phosphatase (APISO) 141 (H) 31 - 125 U/L   AST 86 (H) 10 - 35 U/L   ALT 238 (H) 6 - 29 U/L  CBC with Differential/Platelet     Status: Abnormal   Collection Time: 06/02/20  8:33 AM  Result Value Ref Range   WBC 9.3 3.8 - 10.8 Thousand/uL   RBC 5.19 (H) 3.80 - 5.10 Million/uL   Hemoglobin 15.7 (H) 11.7 - 15.5 g/dL   HCT 46.9 (H) 35 - 45 %   MCV 90.4 80.0 - 100.0 fL   MCH 30.3 27.0 - 33.0 pg   MCHC 33.5 32.0 - 36.0 g/dL   RDW 12.6 11.0 - 15.0 %   Platelets 442 (H) 140 - 400 Thousand/uL   MPV 11.8 7.5 - 12.5 fL   Neutro Abs 5,301 1,500 - 7,800 cells/uL   Lymphs Abs 3,329 850 - 3,900 cells/uL   Absolute Monocytes 530 200 - 950 cells/uL   Eosinophils Absolute 93 15.0 - 500.0 cells/uL   Basophils Absolute 47 0.0 - 200.0 cells/uL   Neutrophils Relative % 57 %   Total Lymphocyte 35.8 %   Monocytes Relative 5.7 %   Eosinophils Relative 1.0 %   Basophils Relative 0.5 %  Lipase     Status: None   Collection Time: 06/02/20  8:33 AM  Result Value Ref Range   Lipase 41 7 - 60 U/L  H. pylori breath test     Status: None   Collection Time: 06/02/20  8:33 AM  Result Value Ref Range   H. pylori Breath Test NOT DETECTED NOT DETECT    Comment: . Antimicrobials, proton pump inhibitors, and bismuth preparations are known to suppress H. pylori, and  ingestion of these prior to H. pylori diagnostic testing may lead to false negative results. If clinically  indicated, the test may be repeated on a new specimen obtained two weeks after discontinuing treatment. However, a positive result is still clinically valid.   POCT Glucose (Device for Home Use)     Status: Abnormal   Collection Time: 06/04/20  1:38 PM  Result Value Ref Range   Glucose Fasting, POC 386 (A) 70 - 99 mg/dL   POC Glucose    POCT Urinalysis Dipstick     Status: Abnormal   Collection Time: 06/04/20  1:55 PM  Result Value Ref Range   Color, UA     Clarity, UA      Glucose, UA     Bilirubin, UA     Ketones, UA Positive     Comment: Small   Spec Grav, UA     Blood, UA     pH, UA     Protein, UA     Urobilinogen, UA     Nitrite, UA     Leukocytes, UA     Appearance     Odor    Glucose, capillary     Status: Abnormal   Collection Time: 06/04/20  2:58 PM  Result Value Ref Range   Glucose-Capillary 406 (H) 70 - 99 mg/dL    Comment: Glucose reference range applies only to samples taken after fasting for at least 8 hours.  Basic metabolic panel     Status: Abnormal   Collection Time: 06/04/20  3:08 PM  Result Value Ref Range   Sodium 133 (L) 135 - 145 mmol/L   Potassium 3.2 (L) 3.5 - 5.1 mmol/L   Chloride 95 (L) 98 - 111 mmol/L   CO2 27 22 - 32 mmol/L   Glucose, Bld 408 (H) 70 - 99 mg/dL    Comment: Glucose reference range applies only to samples taken after fasting for at least 8 hours.   BUN 23 (H) 6 - 20 mg/dL   Creatinine, Ser 0.98 0.44 - 1.00 mg/dL   Calcium 10.2 8.9 - 10.3 mg/dL   GFR calc non Af Amer >60 >60 mL/min   GFR calc Af Amer >60 >60 mL/min   Anion gap 11 5 - 15    Comment: Performed at [facility], [address] [place], [place] [address]  CBC     Status: Abnormal   Collection Time: 06/04/20  3:08 PM  Result Value Ref Range   WBC 8.0 4.0 - 10.5 K/uL   RBC 4.98 3.87 - 5.11 MIL/uL   Hemoglobin 15.3 (H) 12.0 - 15.0 g/dL   HCT 43.3 36 - 46 %   MCV 86.9 80.0 - 100.0 fL   MCH 30.7 26.0 - 34.0 pg   MCHC 35.3 30.0 - 36.0 g/dL   RDW 13.2 11.5 - 15.5 %   Platelets 378 150 - 400 K/uL   nRBC 0.0 0.0 - 0.2 %    Comment: Performed at [facility], [address] [place], [place] [address]  POCT Glucose (CBG)     Status: Abnormal   Collection Time: 06/08/20  1:34 PM  Result Value Ref Range   POC Glucose 297 (A) 70 - 99 mg/dl  CBC with Differential     Status: None  Collection Time: 06/15/20  4:41 PM  Result Value Ref Range   WBC 7.7 4.0 - 10.5 K/uL   RBC 4.55 3.87 - 5.11 MIL/uL   Hemoglobin 14.0  12.0 - 15.0 g/dL   HCT 41.5 36 - 46 %   MCV 91.2 80.0 - 100.0 fL   MCH 30.8 26.0 - 34.0 pg   MCHC 33.7 30.0 - 36.0 g/dL   RDW 13.6 11.5 - 15.5 %   Platelets 397 150 - 400 K/uL   nRBC 0.0 0.0 - 0.2 %   Neutrophils Relative % 50 %   Neutro Abs 3.9 1.7 - 7.7 K/uL   Lymphocytes Relative 40 %   Lymphs Abs 3.1 0.7 - 4.0 K/uL   Monocytes Relative 9 %   Monocytes Absolute 0.7 0.1 - 1.0 K/uL   Eosinophils Relative 1 %   Eosinophils Absolute 0.1 0.0 - 0.5 K/uL   Basophils Relative 0 %   Basophils Absolute 0.0 0.0 - 0.1 K/uL   Immature Granulocytes 0 %   Abs Immature Granulocytes 0.02 0.00 - 0.07 K/uL    Comment: Performed at [facility], [address] [place], [place] [address]  Basic metabolic panel     Status: Abnormal   Collection Time: 06/15/20  4:41 PM  Result Value Ref Range   Sodium 135 135 - 145 mmol/L   Potassium 3.1 (L) 3.5 - 5.1 mmol/L   Chloride 96 (L) 98 - 111 mmol/L   CO2 28 22 - 32 mmol/L   Glucose, Bld 255 (H) 70 - 99 mg/dL    Comment: Glucose reference range applies only to samples taken after fasting for at least 8 hours.   BUN 18 6 - 20 mg/dL   Creatinine, Ser 0.96 0.44 - 1.00 mg/dL   Calcium 9.6 8.9 - 10.3 mg/dL   GFR calc non Af Amer >60 >60 mL/min   GFR calc Af Amer >60 >60 mL/min   Anion gap 11 5 - 15    Comment: Performed at [facility], [address] [place], [place] [address]  POCT Glucose (Device for Home Use)     Status: Abnormal   Collection Time: 06/18/20  2:37 PM  Result Value Ref Range   Glucose Fasting, POC 101 (A) 70 - 99 mg/dL   POC Glucose    POCT HgB A1C     Status: Abnormal   Collection Time: 07/01/20 12:02 PM  Result Value Ref Range   Hemoglobin A1C 10.0 (A) 4.0 - 5.6 %   HbA1c POC (<> result, manual entry)     HbA1c, POC (prediabetic range)     HbA1c, POC (controlled diabetic range)        PHQ2/9: Depression screen [facility] 2/9 07/01/2020 06/18/2020 06/18/2020 06/08/2020 06/04/2020  Decreased Interest 0 1 1 0 1   Down, Depressed, Hopeless 0 1 1 0 -  PHQ - 2 Score 0 2 2 0 1  Altered sleeping - 1 - - 1  Tired, decreased energy - 1 - - 2  Change in appetite - 1 - - 2  Feeling bad or failure about yourself  - 0 - - 0  Trouble concentrating - 0 - - 0  Moving slowly or fidgety/restless - 0 - - 0  Suicidal thoughts - 0 - - 0  PHQ-9 Score - 5 - - 6  Difficult doing work/chores - Somewhat difficult - - Somewhat difficult  Some recent data might be hidden    phq 9 is  negative   Fall Risk: Fall Risk  07/01/2020 06/29/2020 06/18/2020 06/08/2020 06/04/2020  Falls in the past year? 0 0 0 0 0  Number falls in past yr: 0 0 0 0 0  Injury with Fall? 0 0 0 0 0  Risk for fall due to : - - - - -  Follow up - - - - -     Functional Status Survey: Is the patient deaf or have difficulty hearing?: No Does the patient have difficulty seeing, even when wearing glasses/contacts?: Yes Does the patient have difficulty concentrating, remembering, or making decisions?: No Does the patient have difficulty walking or climbing stairs?: No Does the patient have difficulty dressing or bathing?: No Does the patient have difficulty doing errands alone such as visiting a doctor's office or shopping?: No    Assessment & Plan  1. Diabetes mellitus type 2 in obese ([place])  - HM Diabetes Foot Exam   2. Migraine with aura and without status migrainosus, not intractable  Doing better, may return to work, no restrictions   3. Vision problem  Waiting for note from ophthalmologist

## 2020-07-01 ENCOUNTER — Ambulatory Visit (INDEPENDENT_AMBULATORY_CARE_PROVIDER_SITE_OTHER): Payer: BC Managed Care – PPO | Admitting: Family Medicine

## 2020-07-01 ENCOUNTER — Encounter: Payer: Self-pay | Admitting: Family Medicine

## 2020-07-01 ENCOUNTER — Other Ambulatory Visit: Payer: Self-pay

## 2020-07-01 VITALS — BP 128/78 | HR 81 | Temp 98.5°F | Resp 18 | Ht 67.0 in | Wt 308.5 lb

## 2020-07-01 DIAGNOSIS — G43109 Migraine with aura, not intractable, without status migrainosus: Secondary | ICD-10-CM | POA: Diagnosis not present

## 2020-07-01 DIAGNOSIS — H547 Unspecified visual loss: Secondary | ICD-10-CM

## 2020-07-01 DIAGNOSIS — E669 Obesity, unspecified: Secondary | ICD-10-CM | POA: Diagnosis not present

## 2020-07-01 DIAGNOSIS — E1169 Type 2 diabetes mellitus with other specified complication: Secondary | ICD-10-CM | POA: Diagnosis not present

## 2020-07-01 LAB — POCT GLYCOSYLATED HEMOGLOBIN (HGB A1C): Hemoglobin A1C: 10 % — AB (ref 4.0–5.6)

## 2020-07-03 ENCOUNTER — Other Ambulatory Visit: Payer: Self-pay

## 2020-07-03 DIAGNOSIS — Z8673 Personal history of transient ischemic attack (TIA), and cerebral infarction without residual deficits: Secondary | ICD-10-CM

## 2020-07-05 ENCOUNTER — Other Ambulatory Visit: Payer: Self-pay | Admitting: Family Medicine

## 2020-07-05 DIAGNOSIS — E1169 Type 2 diabetes mellitus with other specified complication: Secondary | ICD-10-CM

## 2020-07-05 MED ORDER — ATORVASTATIN CALCIUM 40 MG PO TABS: 40.0000 mg | ORAL_TABLET | ORAL | 1 refills | Status: DC

## 2020-07-05 NOTE — Telephone Encounter (Signed)
Requested medication (s) are due for refill today: -  Requested medication (s) are on the active medication list: yes  Last refill:  06/02/20  Future visit scheduled: yes  Notes to clinic:  please clarify Sig. Dose per prescription is 16 units and pt is reporting takes 30 Units   Requested Prescriptions  Pending Prescriptions Disp Refills   TRESIBA FLEXTOUCH 100 UNIT/ML FlexTouch Pen [Pharmacy Med Name: TRESIBA FLEXTOUCH 100 UNIT/ML]      Sig: INJECT 16 UNITS INTO THE SKIN DAILY.      Endocrinology:  Diabetes - Insulins Failed - 07/05/2020  3:58 PM      Failed - HBA1C is between 0 and 7.9 and within 180 days    Hemoglobin A1C  Date Value Ref Range Status  07/01/2020 10.0 (A) 4.0 - 5.6 % Final  12/30/2011 5.9 4.2 - 6.3 % Final    Comment:    The American Diabetes Association recommends that a primary goal of therapy should be <7% and that physicians should reevaluate the treatment regimen in patients with HbA1c values consistently >8%.    Hgb A1c MFr Bld  Date Value Ref Range Status  10/29/2019 6.8 (H) <5.7 % of total Hgb Final    Comment:    For someone without known diabetes, a hemoglobin A1c value of 6.5% or greater indicates that they may have  diabetes and this should be confirmed with a follow-up  test. . For someone with known diabetes, a value <7% indicates  that their diabetes is well controlled and a value  greater than or equal to 7% indicates suboptimal  control. A1c targets should be individualized based on  duration of diabetes, age, comorbid conditions, and  other considerations. . Currently, no consensus exists regarding use of hemoglobin A1c for diagnosis of diabetes for children. Renella Cunas - Valid encounter within last 6 months    Recent Outpatient Visits           4 days ago Diabetes mellitus type 2 in obese [facility])   [facility] Steele Sizer, MD   2 weeks ago Diabetes mellitus type 2 in obese [facility])   [facility] Steele Sizer, MD   3 weeks ago Uncontrolled type 2 diabetes mellitus with hyperglycemia [facility])   [facility] [place], Drue Stager, MD   1 month ago Uncontrolled type 2 diabetes mellitus with hyperglycemia [facility])   [facility] [place], Drue Stager, MD   1 month ago Diabetes mellitus type 2 in obese [facility])   [facility] Steele Sizer, MD       Future Appointments             In 2 months Ancil Boozer, Drue Stager, MD [facility]

## 2020-07-06 ENCOUNTER — Ambulatory Visit: Payer: BC Managed Care – PPO | Admitting: Neurology

## 2020-07-06 ENCOUNTER — Other Ambulatory Visit: Payer: Self-pay

## 2020-07-06 ENCOUNTER — Encounter: Payer: Self-pay | Admitting: Emergency Medicine

## 2020-07-08 ENCOUNTER — Other Ambulatory Visit: Payer: Self-pay | Admitting: Family Medicine

## 2020-07-08 ENCOUNTER — Encounter: Payer: Self-pay | Admitting: Family Medicine

## 2020-07-08 DIAGNOSIS — E1169 Type 2 diabetes mellitus with other specified complication: Secondary | ICD-10-CM

## 2020-07-08 DIAGNOSIS — E669 Obesity, unspecified: Secondary | ICD-10-CM

## 2020-07-08 MED ORDER — TRESIBA FLEXTOUCH 100 UNIT/ML ~~LOC~~ SOPN: 30.0000 [IU] | PEN_INJECTOR | Freq: Every day | SUBCUTANEOUS | 1 refills | Status: DC

## 2020-07-15 ENCOUNTER — Telehealth: Payer: Self-pay | Admitting: Neurology

## 2020-07-15 ENCOUNTER — Encounter: Payer: Self-pay | Admitting: Family Medicine

## 2020-07-15 ENCOUNTER — Other Ambulatory Visit: Payer: Self-pay | Admitting: Neurology

## 2020-07-15 ENCOUNTER — Encounter: Payer: Self-pay | Admitting: Neurology

## 2020-07-15 ENCOUNTER — Other Ambulatory Visit: Payer: Self-pay | Admitting: Family Medicine

## 2020-07-15 MED ORDER — NURTEC 75 MG PO TBDP: 75.0000 mg | ORAL_TABLET | Freq: Every day | ORAL | 5 refills | Status: DC | PRN

## 2020-07-15 NOTE — Progress Notes (Signed)
Tiffany Nunez (Key: [id number]) Rx #: [id number] Nurtec 75MG  dispersible tablets   Form [employer] General Request Form  Plan Contact [phone]  phone [phone] fax Created 9 minutes ago Sent to Plan less than a minute ago

## 2020-07-15 NOTE — Telephone Encounter (Signed)
Patient called to update Dr Tomi Likens that the nurtec is working well for her and she would like a prescription for it. She also would like clarification on her next appointment date. She thought she was supposed to come back in 4-6 weeks and she is scheduled for 4-6 months.

## 2020-07-15 NOTE — Telephone Encounter (Signed)
Pt advised of DR. Jaffe note below.

## 2020-07-15 NOTE — Telephone Encounter (Signed)
Script for [employer] sent.  Follow up is 4 to 6 months (unfortunately my schedule does not easily allow more frequent follow ups).  However, I advise that she can update me in 6 weeks after her visit and we can make adjustments to treatment if needed.

## 2020-07-16 NOTE — Progress Notes (Signed)
Received fax approval for Nurtec 75mg  valid from 06/16/20 to 07/16/21.

## 2020-07-23 ENCOUNTER — Other Ambulatory Visit: Payer: Self-pay | Admitting: Neurology

## 2020-07-25 ENCOUNTER — Other Ambulatory Visit: Payer: Self-pay | Admitting: Family Medicine

## 2020-07-25 DIAGNOSIS — E039 Hypothyroidism, unspecified: Secondary | ICD-10-CM

## 2020-07-25 DIAGNOSIS — G43009 Migraine without aura, not intractable, without status migrainosus: Secondary | ICD-10-CM

## 2020-07-25 NOTE — Telephone Encounter (Signed)
Requested Prescriptions  Pending Prescriptions Disp Refills  . ibuprofen (ADVIL) 800 MG tablet [Pharmacy Med Name: IBUPROFEN 800 MG TABLET] 90 tablet 0    Sig: TAKE 1 TABLET BY MOUTH EVERY DAY AS NEEDED     Analgesics:  NSAIDS Passed - 07/25/2020  8:56 AM      Passed - Cr in normal range and within 360 days    Creat  Date Value Ref Range Status  06/02/2020 1.18 (H) 0.50 - 1.10 mg/dL Final   Creatinine, Ser  Date Value Ref Range Status  06/15/2020 0.96 0.44 - 1.00 mg/dL Final   Creatinine, Urine  Date Value Ref Range Status  10/29/2019 102 20 - 275 mg/dL Final         Passed - HGB in normal range and within 360 days    Hemoglobin  Date Value Ref Range Status  06/15/2020 14.0 12.0 - 15.0 g/dL Final   HGB  Date Value Ref Range Status  07/23/2014 14.6 12.0 - 16.0 g/dL Final         Passed - Patient is not pregnant      Passed - Valid encounter within last 12 months    Recent Outpatient Visits          3 weeks ago Diabetes mellitus type 2 in obese [facility])   [facility] [place], Drue Stager, MD   1 month ago Diabetes mellitus type 2 in obese [facility])   [facility] Steele Sizer, MD   1 month ago Uncontrolled type 2 diabetes mellitus with hyperglycemia [facility])   [facility] [place], Drue Stager, MD   1 month ago Uncontrolled type 2 diabetes mellitus with hyperglycemia [facility])   [facility] [place], Drue Stager, MD   1 month ago Diabetes mellitus type 2 in obese [facility])   [facility] Steele Sizer, MD      Future Appointments            In 1 month Ancil Boozer, Drue Stager, MD [facility]

## 2020-08-24 ENCOUNTER — Other Ambulatory Visit: Payer: Self-pay | Admitting: Family Medicine

## 2020-08-24 DIAGNOSIS — E1169 Type 2 diabetes mellitus with other specified complication: Secondary | ICD-10-CM

## 2020-08-24 DIAGNOSIS — E669 Obesity, unspecified: Secondary | ICD-10-CM

## 2020-09-04 ENCOUNTER — Other Ambulatory Visit: Payer: Self-pay | Admitting: Family Medicine

## 2020-09-04 DIAGNOSIS — E039 Hypothyroidism, unspecified: Secondary | ICD-10-CM

## 2020-09-04 NOTE — Telephone Encounter (Signed)
Requested medication (s) are due for refill today: yes  Requested medication (s) are on the active medication list:yes  Last refill:  06/03/20  #90  0 rfills  Future visit scheduled:yes 09/07/20  Notes to clinic:  TSH need recheck failed 5.49    Requested Prescriptions  Pending Prescriptions Disp Refills   levothyroxine (SYNTHROID) 25 MCG tablet [Pharmacy Med Name: LEVOTHYROXINE 25 MCG TABLET] 90 tablet 0    Sig: TAKE 1 TABLET BY MOUTH DAILY BEFORE BREAKFAST.      Endocrinology:  Hypothyroid Agents Failed - 09/04/2020  3:04 PM      Failed - TSH needs to be rechecked within 3 months after an abnormal result. Refill until TSH is due.      Failed - TSH in normal range and within 360 days    TSH  Date Value Ref Range Status  06/02/2020 5.49 (H) mIU/L Final    Comment:              Reference Range .           > or = 20 Years  0.40-4.50 .                Pregnancy Ranges           First trimester    0.26-2.66           Second trimester   0.55-2.73           Third trimester    0.43-2.91           Passed - Valid encounter within last 12 months    Recent Outpatient Visits           2 months ago Diabetes mellitus type 2 in obese [facility])   [facility] [place], Drue Stager, MD   2 months ago Diabetes mellitus type 2 in obese [facility])   [facility] Steele Sizer, MD   2 months ago Uncontrolled type 2 diabetes mellitus with hyperglycemia [facility])   [facility] Sizer, MD   3 months ago Uncontrolled type 2 diabetes mellitus with hyperglycemia [facility])   [facility] Sizer, MD   3 months ago Diabetes mellitus type 2 in obese [facility])   [facility] Sizer, MD       Future Appointments             In 3 days Steele Sizer, MD [facility]

## 2020-09-04 NOTE — Progress Notes (Signed)
Name: Tiffany Nunez   MRN: [id number]    DOB: 20-Oct-1970   Date:09/07/2020       Progress Note  Subjective  Chief Complaint  Follow Up  HPI  Chronic migraines: seeing Dr. Tomi Likens, states taking Nurtec prn, also has nortriptyline at night, episodes down about once a week.   DMII: she is now on Tresiba  46  Units and Ozempic and [place]. Her glucose has been 105-120 since last visit She is cutting down on carbohydrates, she states no longer having polyphagia, polydipsia or polyuria Vision is back to normal She has dyslipidemia and obesity . We will try to adjust dose of Ozempic from 0.5 to 1 mg and if glucose drops we will titrate down [place]   HTN: bp is at goal, no chest pain or palpitation. Taking medications and no side effects  Major Depression: she has a history of recurrent depression. She denies suicidal thoughts or ideation, she took Duloxetine for about 6 months from March until September, but stopped medication recently, she states since diabetes better control, headaches not as bothersome , back in church and is feeling well emotionally without medication.   Hypothyroidism:she stopped seeing [name] Last [facility] , but she was out of medication, she is back on 25 mcg daily , no hair loss, weight is stable, no change in bowel movements   Morbid obesity: she is doing some intermittent fasting, explained risk of hypoglycemia with insulin, lost 3 lbs since last visit, eating a better diet   History of CVA:She states she has occasional episodes of dizziness, but otherwise not problems.Shehas been taking Atorvastatin daily now and we will recheck labs, also advised to resume low dose aspirin daily    Patient Active Problem List   Diagnosis Date Noted  . Mild episode of recurrent major depressive disorder ([place]) 05/28/2018  . History of cerebrovascular accident (CVA) involving cerebellum 06/12/2017  . Acquired autoimmune hypothyroidism 11/09/2016  . Vitamin D  deficiency 05/03/2015  . Dyslipidemia 05/03/2015  . Gastro-esophageal reflux disease without esophagitis 05/03/2015  . Alopecia 05/03/2015  . Genital herpes 05/03/2015  . Dysmetabolic syndrome 36/02/7702  . Positive H. pylori test 05/03/2015  . Neuralgia neuritis, sciatic nerve 05/03/2015  . Migraine without aura and responsive to treatment 04/30/2014  . HTN (hypertension) 01/22/2014  . Morbid obesity ([place]) 01/22/2014  . Diabetes mellitus type 2 in obese ([place]) 01/22/2014  . Decreased cardiac ejection fraction 01/22/2014    Past Surgical History:  Procedure Laterality Date  . ABDOMINAL HYSTERECTOMY    . ANKLE SURGERY     right  . TUBAL LIGATION      Family History  Problem Relation Age of Onset  . Hypertension Father   . Hyperlipidemia Father   . Hyperlipidemia Mother   . Hypertension Mother   . Hypertension Paternal Aunt   . Cancer Maternal Grandmother        breast    Social History   Tobacco Use  . Smoking status: Never Smoker  . Smokeless tobacco: Never Used  Substance Use Topics  . Alcohol use: No    Alcohol/week: 0.0 standard drinks     Current Outpatient Medications:  .  ACCU-CHEK GUIDE test strip, USE AS DIRECTED TO TEST FASTING BLOOD SUGAR TWICE A DAY, Disp: 100 strip, Rfl: 2 .  Accu-Chek Softclix Lancets lancets, SMARTSIG:Topical, Disp: , Rfl:  .  atorvastatin (LIPITOR) 40 MG tablet, Take 1 tablet (40 mg total) by mouth every other day., Disp: 45 tablet, Rfl: 1 .  blood glucose meter kit and supplies, Dispense based on patient and insurance preference. FSBS twice daily  (FOR ICD-10 E10.9, E11.9)., Disp: 1 each, Rfl: 0 .  DULoxetine (CYMBALTA) 60 MG capsule, Take 1 capsule (60 mg total) by mouth daily., Disp: 90 capsule, Rfl: 1 .  FARXIGA 10 MG TABS tablet, TAKE 1 TABLET (10 MG TOTAL) BY MOUTH DAILY BEFORE BREAKFAST., Disp: 90 tablet, Rfl: 0 .  hydrALAZINE (APRESOLINE) 50 MG tablet, Take 1 tablet (50 mg total) by mouth 2 (two) times daily., Disp: 180  tablet, Rfl: 1 .  ibuprofen (ADVIL) 800 MG tablet, TAKE 1 TABLET BY MOUTH EVERY DAY AS NEEDED, Disp: 90 tablet, Rfl: 0 .  insulin degludec (TRESIBA FLEXTOUCH) 100 UNIT/ML FlexTouch Pen, Inject 30-50 Units into the skin daily., Disp: 15 mL, Rfl: 1 .  Insulin Pen Needle (NOVOFINE) 30G X 8 MM MISC, Inject 10 each into the skin as needed., Disp: 100 each, Rfl: 1 .  levothyroxine (SYNTHROID) 25 MCG tablet, TAKE 1 TABLET BY MOUTH DAILY BEFORE BREAKFAST., Disp: 30 tablet, Rfl: 0 .  nadolol (CORGARD) 40 MG tablet, Take 1 tablet (40 mg total) by mouth 2 (two) times daily., Disp: 180 tablet, Rfl: 1 .  nortriptyline (PAMELOR) 25 MG capsule, TAKE 1 CAPSULE BY MOUTH AT BEDTIME., Disp: 90 capsule, Rfl: 2 .  Olmesartan-amLODIPine-HCTZ 40-10-25 MG TABS, Take 1 tablet by mouth daily., Disp: 90 tablet, Rfl: 1 .  ondansetron (ZOFRAN-ODT) 4 MG disintegrating tablet, TAKE 1 TABLET BY MOUTH EVERY 8 HOURS AS NEEDED FOR NAUSEA AND VOMITING, Disp: 20 tablet, Rfl: 0 .  Rimegepant Sulfate (NURTEC) 75 MG TBDP, Take 75 mg by mouth daily as needed., Disp: 8 tablet, Rfl: 5 .  Semaglutide,0.25 or 0.5MG/DOS, (OZEMPIC, 0.25 OR 0.5 MG/DOSE,) 2 MG/1.5ML SOPN, Inject 0.25-0.5 mg into the skin once a week. Start at 0.25 for 3 weeks after that go up to 0.5 mg and stay there, Disp: 1.5 mL, Rfl: 2 .  Vitamin D, Ergocalciferol, (DRISDOL) 1.25 MG (50000 UNIT) CAPS capsule, Take 1 capsule (50,000 Units total) by mouth every 7 (seven) days., Disp: 12 capsule, Rfl: 1 .  fluticasone (FLONASE) 50 MCG/ACT nasal spray, Place 2 sprays into both nostrils daily. (Patient not taking: Reported on 09/07/2020), Disp: 16 g, Rfl: 6  Allergies  Allergen Reactions  . Imitrex [Sumatriptan] Anaphylaxis  . Topamax [Topiramate] Other (See Comments)    Caused hand numbness   . Ubrogepant Other (See Comments)    [name] made her feel groggy the following day and bad taste in her month     I personally reviewed active problem list, medication list, allergies,  family history, social history, health maintenance, notes from last encounter with the patient/caregiver today.   ROS  Constitutional: Negative for fever or weight change.  Respiratory: Negative for cough and shortness of breath.   Cardiovascular: Negative for chest pain or palpitations.  Gastrointestinal: Negative for abdominal pain, no bowel changes.  Musculoskeletal: Negative for gait problem or joint swelling.  Skin: Negative for rash.  Neurological: Negative for dizziness or headache.  No other specific complaints in a complete review of systems (except as listed in HPI above).  Objective  Vitals:   09/07/20 0940  BP: 126/74  Pulse: 74  Resp: 18  Temp: 98 F (36.7 C)  TempSrc: Oral  SpO2: 98%  Weight: (!) 307 lb 1.6 oz (139.3 kg)  Height: '5\' 7"'  (1.702 m)    Body mass index is 48.1 kg/m.  Physical Exam  Constitutional: Patient appears well-developed  and well-nourished. Obese  No distress.  HEENT: head atraumatic, normocephalic, pupils equal and reactive to light,  neck supple Cardiovascular: Normal rate, regular rhythm and normal heart sounds.  No murmur heard. No BLE edema. Pulmonary/Chest: Effort normal and breath sounds normal. No respiratory distress. Abdominal: Soft.  There is no tenderness. Psychiatric: Patient has a normal mood and affect. behavior is normal. Judgment and thought content normal.  Recent Results (from the past 2160 hour(s))  CBC with Differential     Status: None   Collection Time: 06/15/20  4:41 PM  Result Value Ref Range   WBC 7.7 4.0 - 10.5 K/uL   RBC 4.55 3.87 - 5.11 MIL/uL   Hemoglobin 14.0 12.0 - 15.0 g/dL   HCT 41.5 36.0 - 46.0 %   MCV 91.2 80.0 - 100.0 fL   MCH 30.8 26.0 - 34.0 pg   MCHC 33.7 30.0 - 36.0 g/dL   RDW 13.6 11.5 - 15.5 %   Platelets 397 150 - 400 K/uL   nRBC 0.0 0.0 - 0.2 %   Neutrophils Relative % 50 %   Neutro Abs 3.9 1.7 - 7.7 K/uL   Lymphocytes Relative 40 %   Lymphs Abs 3.1 0.7 - 4.0 K/uL   Monocytes  Relative 9 %   Monocytes Absolute 0.7 0.1 - 1.0 K/uL   Eosinophils Relative 1 %   Eosinophils Absolute 0.1 0.0 - 0.5 K/uL   Basophils Relative 0 %   Basophils Absolute 0.0 0.0 - 0.1 K/uL   Immature Granulocytes 0 %   Abs Immature Granulocytes 0.02 0.00 - 0.07 K/uL    Comment: Performed at [facility], [address] [place], [place] [address]  Basic metabolic panel     Status: Abnormal   Collection Time: 06/15/20  4:41 PM  Result Value Ref Range   Sodium 135 135 - 145 mmol/L   Potassium 3.1 (L) 3.5 - 5.1 mmol/L   Chloride 96 (L) 98 - 111 mmol/L   CO2 28 22 - 32 mmol/L   Glucose, Bld 255 (H) 70 - 99 mg/dL    Comment: Glucose reference range applies only to samples taken after fasting for at least 8 hours.   BUN 18 6 - 20 mg/dL   Creatinine, Ser 0.96 0.44 - 1.00 mg/dL   Calcium 9.6 8.9 - 10.3 mg/dL   GFR calc non Af Amer >60 >60 mL/min   GFR calc Af Amer >60 >60 mL/min   Anion gap 11 5 - 15    Comment: Performed at [facility], [address] [place], [place] [address]  POCT Glucose (Device for Home Use)     Status: Abnormal   Collection Time: 06/18/20  2:37 PM  Result Value Ref Range   Glucose Fasting, POC 101 (A) 70 - 99 mg/dL   POC Glucose    HM DIABETES EYE EXAM     Status: None   Collection Time: 06/26/20 12:00 AM  Result Value Ref Range   HM Diabetic Eye Exam No Retinopathy No Retinopathy  POCT HgB A1C     Status: Abnormal   Collection Time: 07/01/20 12:02 PM  Result Value Ref Range   Hemoglobin A1C 10.0 (A) 4.0 - 5.6 %   HbA1c POC (<> result, manual entry)     HbA1c, POC (prediabetic range)     HbA1c, POC (controlled diabetic range)       PHQ2/9: Depression screen [facility] 2/9 09/07/2020 07/01/2020 06/18/2020 06/18/2020 06/08/2020  Decreased Interest 0  0 1 1 0  Down, Depressed, Hopeless 0 0 1 1 0  PHQ - 2 Score 0 0 2 2 0  Altered sleeping - - 1 - -  Tired, decreased energy - - 1 - -  Change in appetite - - 1 - -  Feeling bad or failure about  yourself  - - 0 - -  Trouble concentrating - - 0 - -  Moving slowly or fidgety/restless - - 0 - -  Suicidal thoughts - - 0 - -  PHQ-9 Score - - 5 - -  Difficult doing work/chores - - Somewhat difficult - -  Some recent data might be hidden    phq 9 is negative   Fall Risk: Fall Risk  09/07/2020 07/01/2020 06/29/2020 06/18/2020 06/08/2020  Falls in the past year? 0 0 0 0 0  Number falls in past yr: 0 0 0 0 0  Injury with Fall? 0 0 0 0 0  Risk for fall due to : - - - - -  Follow up - - - - -    Functional Status Survey: Is the patient deaf or have difficulty hearing?: No Does the patient have difficulty seeing, even when wearing glasses/contacts?: No Does the patient have difficulty concentrating, remembering, or making decisions?: No Does the patient have difficulty walking or climbing stairs?: No Does the patient have difficulty dressing or bathing?: No Does the patient have difficulty doing errands alone such as visiting a doctor's office or shopping?: No   Assessment & Plan  1. Uncontrolled type 2 diabetes mellitus with hyperglycemia (HCC)  - COMPLETE METABOLIC PANEL WITH GFR - Hemoglobin A1c  2. Dyslipidemia  - Lipid panel  3. Migraine with aura and without status migrainosus, not intractable   4. Diabetes mellitus type 2 in obese (HCC)  - dapagliflozin propanediol (FARXIGA) 10 MG TABS tablet; Take 1 tablet (10 mg total) by mouth daily.  Dispense: 90 tablet; Refill: 0 - Semaglutide, 1 MG/DOSE, (OZEMPIC, 1 MG/DOSE,) 4 MG/3ML SOPN; Inject 1 mg into the skin once a week.  Dispense: 9 mL; Refill: 1  5. Hypothyroidism, adult  - TSH  6. Morbid obesity ([place])  Discussed with the patient the risk posed by an increased BMI. Discussed importance of portion control, calorie counting and at least 150 minutes of physical activity weekly. Avoid sweet beverages and drink more water. Eat at least 6 servings of fruit and vegetables daily   7. Essential hypertension  - nadolol  (CORGARD) 40 MG tablet; Take 1 tablet (40 mg total) by mouth 2 (two) times daily.  Dispense: 180 tablet; Refill: 1 - Olmesartan-amLODIPine-HCTZ 40-10-25 MG TABS; Take 1 tablet by mouth daily.  Dispense: 90 tablet; Refill: 1  8. Vitamin D deficiency

## 2020-09-07 ENCOUNTER — Other Ambulatory Visit: Payer: Self-pay

## 2020-09-07 ENCOUNTER — Encounter: Payer: Self-pay | Admitting: Family Medicine

## 2020-09-07 ENCOUNTER — Ambulatory Visit (INDEPENDENT_AMBULATORY_CARE_PROVIDER_SITE_OTHER): Payer: BC Managed Care – PPO | Admitting: Family Medicine

## 2020-09-07 VITALS — BP 126/74 | HR 74 | Temp 98.0°F | Resp 18 | Ht 67.0 in | Wt 307.1 lb

## 2020-09-07 DIAGNOSIS — E1169 Type 2 diabetes mellitus with other specified complication: Secondary | ICD-10-CM | POA: Diagnosis not present

## 2020-09-07 DIAGNOSIS — G43109 Migraine with aura, not intractable, without status migrainosus: Secondary | ICD-10-CM | POA: Diagnosis not present

## 2020-09-07 DIAGNOSIS — E669 Obesity, unspecified: Secondary | ICD-10-CM

## 2020-09-07 DIAGNOSIS — E559 Vitamin D deficiency, unspecified: Secondary | ICD-10-CM

## 2020-09-07 DIAGNOSIS — E1165 Type 2 diabetes mellitus with hyperglycemia: Secondary | ICD-10-CM

## 2020-09-07 DIAGNOSIS — E785 Hyperlipidemia, unspecified: Secondary | ICD-10-CM

## 2020-09-07 DIAGNOSIS — E039 Hypothyroidism, unspecified: Secondary | ICD-10-CM

## 2020-09-07 DIAGNOSIS — I10 Essential (primary) hypertension: Secondary | ICD-10-CM

## 2020-09-07 MED ORDER — DAPAGLIFLOZIN PROPANEDIOL 10 MG PO TABS: 10.0000 mg | ORAL_TABLET | Freq: Every day | ORAL | 0 refills | Status: DC

## 2020-09-07 MED ORDER — OLMESARTAN-AMLODIPINE-HCTZ 40-10-25 MG PO TABS: 1.0000 | ORAL_TABLET | Freq: Every day | ORAL | 1 refills | Status: DC

## 2020-09-07 MED ORDER — NADOLOL 40 MG PO TABS: 40.0000 mg | ORAL_TABLET | Freq: Two times a day (BID) | ORAL | 1 refills | Status: DC

## 2020-09-07 MED ORDER — OZEMPIC (1 MG/DOSE) 4 MG/3ML ~~LOC~~ SOPN
1.0000 mg | PEN_INJECTOR | SUBCUTANEOUS | 1 refills | Status: DC
Start: 2020-09-07 — End: 2021-03-18

## 2020-09-08 LAB — COMPLETE METABOLIC PANEL WITH GFR
AG Ratio: 1.1 (calc) (ref 1.0–2.5)
ALT: 50 U/L — ABNORMAL HIGH (ref 6–29)
AST: 29 U/L (ref 10–35)
Albumin: 4.1 g/dL (ref 3.6–5.1)
Alkaline phosphatase (APISO): 109 U/L (ref 31–125)
BUN: 15 mg/dL (ref 7–25)
CO2: 30 mmol/L (ref 20–32)
Calcium: 10.5 mg/dL — ABNORMAL HIGH (ref 8.6–10.2)
Chloride: 103 mmol/L (ref 98–110)
Creat: 0.97 mg/dL (ref 0.50–1.10)
GFR, Est African American: 79 mL/min/{1.73_m2} (ref >=60)
GFR, Est Non African American: 69 mL/min/{1.73_m2} (ref >=60)
Globulin: 3.6 g/dL (calc) (ref 1.9–3.7)
Glucose, Bld: 92 mg/dL (ref 65–99)
Potassium: 3.9 mmol/L (ref 3.5–5.3)
Sodium: 143 mmol/L (ref 135–146)
Total Bilirubin: 0.5 mg/dL (ref 0.2–1.2)
Total Protein: 7.7 g/dL (ref 6.1–8.1)

## 2020-09-08 LAB — HEMOGLOBIN A1C
Hgb A1c MFr Bld: 7 % of total Hgb — ABNORMAL HIGH (ref <5.7)
Mean Plasma Glucose: 154 mg/dL
eAG (mmol/L): 8.5 mmol/L

## 2020-09-08 LAB — LIPID PANEL
Cholesterol: 146 mg/dL (ref <200)
HDL: 47 mg/dL — ABNORMAL LOW (ref >=50)
LDL Cholesterol (Calc): 82 mg/dL (calc)
Non-HDL Cholesterol (Calc): 99 mg/dL (calc) (ref <130)
Total CHOL/HDL Ratio: 3.1 (calc) (ref <5.0)
Triglycerides: 88 mg/dL (ref <150)

## 2020-09-08 LAB — TSH: TSH: 2.89 mIU/L

## 2020-09-16 ENCOUNTER — Other Ambulatory Visit: Payer: Self-pay | Admitting: Family Medicine

## 2020-09-16 DIAGNOSIS — E1169 Type 2 diabetes mellitus with other specified complication: Secondary | ICD-10-CM

## 2020-09-19 ENCOUNTER — Other Ambulatory Visit: Payer: Self-pay | Admitting: Family Medicine

## 2020-09-19 DIAGNOSIS — G43009 Migraine without aura, not intractable, without status migrainosus: Secondary | ICD-10-CM

## 2020-09-21 ENCOUNTER — Other Ambulatory Visit: Payer: Self-pay

## 2020-09-27 ENCOUNTER — Other Ambulatory Visit: Payer: Self-pay | Admitting: Family Medicine

## 2020-09-27 DIAGNOSIS — E039 Hypothyroidism, unspecified: Secondary | ICD-10-CM

## 2020-10-20 ENCOUNTER — Other Ambulatory Visit: Payer: Self-pay | Admitting: Family Medicine

## 2020-10-20 DIAGNOSIS — E039 Hypothyroidism, unspecified: Secondary | ICD-10-CM

## 2020-10-25 ENCOUNTER — Other Ambulatory Visit: Payer: Self-pay | Admitting: Family Medicine

## 2020-10-25 DIAGNOSIS — I10 Essential (primary) hypertension: Secondary | ICD-10-CM

## 2020-11-18 ENCOUNTER — Other Ambulatory Visit: Payer: Self-pay

## 2020-11-18 MED ORDER — NORTRIPTYLINE HCL 50 MG PO CAPS: ORAL_CAPSULE | ORAL | 0 refills | Status: DC

## 2020-11-18 NOTE — Progress Notes (Signed)
Per Dr.Jaffe increase the Nortriptyline to 50 mg at bedtime. New script sent to pharmacy.

## 2020-12-03 ENCOUNTER — Other Ambulatory Visit: Payer: Self-pay | Admitting: Family Medicine

## 2020-12-03 DIAGNOSIS — E1169 Type 2 diabetes mellitus with other specified complication: Secondary | ICD-10-CM

## 2020-12-07 NOTE — Progress Notes (Signed)
Name: Tiffany Nunez   MRN: [id number]    DOB: 08/04/1971   Date:12/10/2020       Progress Note  Subjective  Chief Complaint  Follow Up  HPI  Chronic migraines: seeing Dr. Jaffe,  Currently on Nortriptyline at night and Reyvowl prn, Nurteq stopped working, cannot take triptans  She states frequency of migraine has decrease and also the intensity. Episodes per week are down to 3 per week, she is able to go to work now for most episodes, she states only missed 3 days of work this month, she states episodes can last 2 days. She needs FMLA forms filled out. Migraines sometimes with aura, but other times just has pain on frontal area that radiates to the back of her head, described as throbbing, sometimes has nausea but lately no episodes of vomiting.   DMII: she is now on Tresiba 40 units ( down from 48 )  Also on Ozempic and Farxiga. Her glucose has been in the 90's fasting, advised to go down on Tresiba by 2 units every 3 days to keep glucose between 100-120 . She is following a low carbohydrate diet and has lost weight since last visit. She denies polyphagia, polydipsia or polyuria Vision is back to normal She has dyslipidemia and obesity.   HTN: bp is at goal, no chest pain or palpitation. Taking medications but only taking Hydralazine and Nadolol in am's, she states hard to remember taking pills at night, we will stop hydralazine and increase dose of nadolol to 80 mg in am's .   Major Depression: she has a history of recurrent depression. She denies suicidal thoughts or ideation, she took Duloxetine for about 6 months from March until September,but has been off medication, feeling better, more motivation, happy with her weight loss.   Dyslipidemia: Lipid panel improved, but LDL was still above 70, she is only taking statin therapy every other day   Hypothyroidism:she stopped seeing Dr. Kumar Last TSHwaswithin normal limits. Continue current dose , she has noticed some hair loss and we can  recheck level   Morbid obesity: she is cooking at home, avoiding sweets, losing weight, Ozempic is curbing her appetite. Continue hard work   History of CVA:She states she has occasional episodes of dizziness, but otherwise not problems.Shehas been taking Atorvastatin every other day, she was reminded again to take aspirin 81 mg daily   Chronic constipation: she states she has bowel movements a couple of times a week, but she states when she tries new food she has explosive diarrhea.   Patient Active Problem List   Diagnosis Date Noted  . Mild episode of recurrent major depressive disorder (HCC) 05/28/2018  . History of cerebrovascular accident (CVA) involving cerebellum 06/12/2017  . Acquired autoimmune hypothyroidism 11/09/2016  . Vitamin D deficiency 05/03/2015  . Dyslipidemia 05/03/2015  . Gastro-esophageal reflux disease without esophagitis 05/03/2015  . Alopecia 05/03/2015  . Genital herpes 05/03/2015  . Dysmetabolic syndrome 05/03/2015  . Positive H. pylori test 05/03/2015  . Neuralgia neuritis, sciatic nerve 05/03/2015  . Migraine without aura and responsive to treatment 04/30/2014  . HTN (hypertension) 01/22/2014  . Morbid obesity (HCC) 01/22/2014  . Diabetes mellitus type 2 in obese (HCC) 01/22/2014  . Decreased cardiac ejection fraction 01/22/2014    Past Surgical History:  Procedure Laterality Date  . ABDOMINAL HYSTERECTOMY    . ANKLE SURGERY     right  . TUBAL LIGATION      Family History  Problem Relation Age of Onset  .   Hypertension Father   . Hyperlipidemia Father   . Hyperlipidemia Mother   . Hypertension Mother   . Hypertension Paternal Aunt   . Cancer Maternal Grandmother        breast    Social History   Tobacco Use  . Smoking status: Never Smoker  . Smokeless tobacco: Never Used  Substance Use Topics  . Alcohol use: No    Alcohol/week: 0.0 standard drinks     Current Outpatient Medications:  .  ACCU-CHEK GUIDE test strip, USE AS  DIRECTED TO TEST FASTING BLOOD SUGAR TWICE A DAY, Disp: 100 strip, Rfl: 2 .  Accu-Chek Softclix Lancets lancets, SMARTSIG:Topical, Disp: , Rfl:  .  blood glucose meter kit and supplies, Dispense based on patient and insurance preference. FSBS twice daily  (FOR ICD-10 E10.9, E11.9)., Disp: 1 each, Rfl: 0 .  ibuprofen (ADVIL) 800 MG tablet, TAKE 1 TABLET BY MOUTH EVERY DAY AS NEEDED, Disp: 90 tablet, Rfl: 0 .  Insulin Pen Needle (NOVOFINE) 30G X 8 MM MISC, Inject 10 each into the skin as needed., Disp: 100 each, Rfl: 1 .  Lasmiditan Succinate 50 MG TABS, Take 1 tablet by mouth daily as needed., Disp: , Rfl:  .  levothyroxine (SYNTHROID) 25 MCG tablet, TAKE 1 TABLET BY MOUTH EVERY DAY BEFORE BREAKFAST, Disp: 90 tablet, Rfl: 0 .  nortriptyline (PAMELOR) 50 MG capsule, Take 1 capsule at bedtime, Disp: 30 capsule, Rfl: 0 .  Olmesartan-amLODIPine-HCTZ 40-10-25 MG TABS, Take 1 tablet by mouth daily., Disp: 90 tablet, Rfl: 1 .  ondansetron (ZOFRAN-ODT) 4 MG disintegrating tablet, TAKE 1 TABLET BY MOUTH EVERY 8 HOURS AS NEEDED FOR NAUSEA AND VOMITING, Disp: 20 tablet, Rfl: 0 .  Semaglutide, 1 MG/DOSE, (OZEMPIC, 1 MG/DOSE,) 4 MG/3ML SOPN, Inject 1 mg into the skin once a week., Disp: 9 mL, Rfl: 1 .  TRESIBA FLEXTOUCH 100 UNIT/ML FlexTouch Pen, INJECT 30-50 UNITS INTO THE SKIN DAILY, Disp: 15 mL, Rfl: 1 .  atorvastatin (LIPITOR) 40 MG tablet, Take 1 tablet (40 mg total) by mouth every other day., Disp: 45 tablet, Rfl: 1 .  dapagliflozin propanediol (FARXIGA) 10 MG TABS tablet, Take 1 tablet (10 mg total) by mouth daily., Disp: 90 tablet, Rfl: 1 .  fluticasone (FLONASE) 50 MCG/ACT nasal spray, Place 2 sprays into both nostrils daily. (Patient not taking: Reported on 12/10/2020), Disp: 16 g, Rfl: 6 .  nadolol (CORGARD) 80 MG tablet, Take 1 tablet (80 mg total) by mouth daily., Disp: 90 tablet, Rfl: 0 .  Vitamin D, Ergocalciferol, (DRISDOL) 1.25 MG (50000 UNIT) CAPS capsule, Take 1 capsule (50,000 Units total) by  mouth every 7 (seven) days., Disp: 12 capsule, Rfl: 1  Allergies  Allergen Reactions  . Imitrex [Sumatriptan] Anaphylaxis  . Topamax [Topiramate] Other (See Comments)    Caused hand numbness   . Ubrogepant Other (See Comments)    Ubrelvy made her feel groggy the following day and bad taste in her month     I personally reviewed active problem list, medication list, allergies, family history, social history, health maintenance with the patient/caregiver today.   ROS  Constitutional: Negative for fever, positive for  weight change.  Respiratory: Negative for cough and shortness of breath.   Cardiovascular: Negative for chest pain or palpitations.  Gastrointestinal: Negative for abdominal pain, no bowel changes.  Musculoskeletal: Negative for gait problem or joint swelling.  Skin: Negative for rash.  Neurological: Negative for dizziness, positive for  headache.  No other specific complaints in a complete review   of systems (except as listed in HPI above).  Objective  Vitals:   12/10/20 0837  BP: 132/80  Pulse: 89  Resp: 16  Temp: 98.1 F (36.7 C)  TempSrc: Oral  SpO2: 98%  Weight: 299 lb (135.6 kg)  Height: 5' 7" (1.702 m)    Body mass index is 46.83 kg/m.  Physical Exam  Constitutional: Patient appears well-developed and well-nourished. Obese No distress.  HEENT: head atraumatic, normocephalic, pupils equal and reactive to light,, neck supple Cardiovascular: Normal rate, regular rhythm and normal heart sounds.  No murmur heard. No BLE edema. Pulmonary/Chest: Effort normal and breath sounds normal. No respiratory distress. Abdominal: Soft.  There is no tenderness. Psychiatric: Patient has a normal mood and affect. behavior is normal. Judgment and thought content normal.  Recent Results (from the past 2160 hour(s))  POCT HgB A1C     Status: Abnormal   Collection Time: 12/10/20  8:45 AM  Result Value Ref Range   Hemoglobin A1C 6.0 (A) 4.0 - 5.6 %   HbA1c POC (<>  result, manual entry)     HbA1c, POC (prediabetic range)     HbA1c, POC (controlled diabetic range)       PHQ2/9: Depression screen PHQ 2/9 12/10/2020 09/07/2020 07/01/2020 06/18/2020 06/18/2020  Decreased Interest 0 0 0 1 1  Down, Depressed, Hopeless 0 0 0 1 1  PHQ - 2 Score 0 0 0 2 2  Altered sleeping 1 - - 1 -  Tired, decreased energy 1 - - 1 -  Change in appetite 0 - - 1 -  Feeling bad or failure about yourself  0 - - 0 -  Trouble concentrating 0 - - 0 -  Moving slowly or fidgety/restless 0 - - 0 -  Suicidal thoughts 0 - - 0 -  PHQ-9 Score 2 - - 5 -  Difficult doing work/chores - - - Somewhat difficult -  Some recent data might be hidden    phq 9 is negative   Fall Risk: Fall Risk  12/10/2020 09/07/2020 07/01/2020 06/29/2020 06/18/2020  Falls in the past year? 0 0 0 0 0  Number falls in past yr: 0 0 0 0 0  Injury with Fall? 0 0 0 0 0  Risk for fall due to : - - - - -  Follow up - - - - -    Functional Status Survey: Is the patient deaf or have difficulty hearing?: No Does the patient have difficulty seeing, even when wearing glasses/contacts?: No Does the patient have difficulty concentrating, remembering, or making decisions?: No Does the patient have difficulty walking or climbing stairs?: No Does the patient have difficulty dressing or bathing?: No Does the patient have difficulty doing errands alone such as visiting a doctor's office or shopping?: No   Assessment & Plan  1. Colon cancer screening  - Ambulatory referral to Gastroenterology  2. History of cerebrovascular accident (CVA) involving cerebellum  - atorvastatin (LIPITOR) 40 MG tablet; Take 1 tablet (40 mg total) by mouth every other day.  Dispense: 45 tablet; Refill: 1  3. Diabetes mellitus type 2 in obese (HCC)  - POCT HgB A1C - dapagliflozin propanediol (FARXIGA) 10 MG TABS tablet; Take 1 tablet (10 mg total) by mouth daily.  Dispense: 90 tablet; Refill: 1  4. Essential hypertension  - nadolol  (CORGARD) 80 MG tablet; Take 1 tablet (80 mg total) by mouth daily.  Dispense: 90 tablet; Refill: 0 - COMPLETE METABOLIC PANEL WITH GFR - CBC with Differential/Platelet    5. Vitamin D deficiency  - Vitamin D, Ergocalciferol, (DRISDOL) 1.25 MG (50000 UNIT) CAPS capsule; Take 1 capsule (50,000 Units total) by mouth every 7 (seven) days.  Dispense: 12 capsule; Refill: 1 - VITAMIN D 25 Hydroxy (Vit-D Deficiency, Fractures)  6. Dyslipidemia associated with type 2 diabetes mellitus (HCC)  - atorvastatin (LIPITOR) 40 MG tablet; Take 1 tablet (40 mg total) by mouth every other day.  Dispense: 45 tablet; Refill: 1  7. Hair loss  - Vitamin B12  8. Morbid obesity (HCC)  Discussed with the patient the risk posed by an increased BMI. Discussed importance of portion control, calorie counting and at least 150 minutes of physical activity weekly. Avoid sweet beverages and drink more water. Eat at least 6 servings of fruit and vegetables daily   9. Hypothyroidism, adult  - TSH  10. Migraine without aura and responsive to treatment   11. Hypercalcemia  - COMPLETE METABOLIC PANEL WITH GFR - Parathyroid hormone, intact (no Ca)  12. Chronic constipation   13. Need for Tdap vaccination  - Tdap vaccine greater than or equal to 7yo IM 

## 2020-12-10 ENCOUNTER — Other Ambulatory Visit: Payer: Self-pay | Admitting: Family Medicine

## 2020-12-10 ENCOUNTER — Other Ambulatory Visit: Payer: Self-pay

## 2020-12-10 ENCOUNTER — Encounter: Payer: Self-pay | Admitting: Family Medicine

## 2020-12-10 ENCOUNTER — Ambulatory Visit (INDEPENDENT_AMBULATORY_CARE_PROVIDER_SITE_OTHER): Payer: BC Managed Care – PPO | Admitting: Family Medicine

## 2020-12-10 VITALS — BP 132/80 | HR 89 | Temp 98.1°F | Resp 16 | Ht 67.0 in | Wt 299.0 lb

## 2020-12-10 DIAGNOSIS — Z8673 Personal history of transient ischemic attack (TIA), and cerebral infarction without residual deficits: Secondary | ICD-10-CM

## 2020-12-10 DIAGNOSIS — Z23 Encounter for immunization: Secondary | ICD-10-CM

## 2020-12-10 DIAGNOSIS — K5909 Other constipation: Secondary | ICD-10-CM

## 2020-12-10 DIAGNOSIS — E669 Obesity, unspecified: Secondary | ICD-10-CM

## 2020-12-10 DIAGNOSIS — E559 Vitamin D deficiency, unspecified: Secondary | ICD-10-CM

## 2020-12-10 DIAGNOSIS — Z1211 Encounter for screening for malignant neoplasm of colon: Secondary | ICD-10-CM

## 2020-12-10 DIAGNOSIS — E1169 Type 2 diabetes mellitus with other specified complication: Secondary | ICD-10-CM | POA: Diagnosis not present

## 2020-12-10 DIAGNOSIS — G43009 Migraine without aura, not intractable, without status migrainosus: Secondary | ICD-10-CM

## 2020-12-10 DIAGNOSIS — L659 Nonscarring hair loss, unspecified: Secondary | ICD-10-CM

## 2020-12-10 DIAGNOSIS — I10 Essential (primary) hypertension: Secondary | ICD-10-CM

## 2020-12-10 DIAGNOSIS — E039 Hypothyroidism, unspecified: Secondary | ICD-10-CM

## 2020-12-10 DIAGNOSIS — E785 Hyperlipidemia, unspecified: Secondary | ICD-10-CM

## 2020-12-10 LAB — POCT GLYCOSYLATED HEMOGLOBIN (HGB A1C): Hemoglobin A1C: 6 % — AB (ref 4.0–5.6)

## 2020-12-10 MED ORDER — NADOLOL 80 MG PO TABS: 80.0000 mg | ORAL_TABLET | Freq: Every day | ORAL | 0 refills | Status: DC

## 2020-12-10 MED ORDER — VITAMIN D (ERGOCALCIFEROL) 1.25 MG (50000 UNIT) PO CAPS: 50000.0000 [IU] | ORAL_CAPSULE | ORAL | 1 refills | Status: DC

## 2020-12-10 MED ORDER — ATORVASTATIN CALCIUM 40 MG PO TABS: 40.0000 mg | ORAL_TABLET | ORAL | 1 refills | Status: DC

## 2020-12-10 MED ORDER — DAPAGLIFLOZIN PROPANEDIOL 10 MG PO TABS
10.0000 mg | ORAL_TABLET | Freq: Every day | ORAL | 1 refills | Status: DC
Start: 2020-12-10 — End: 2021-12-13

## 2020-12-11 LAB — CBC WITH DIFFERENTIAL/PLATELET
Absolute Monocytes: 447 cells/uL (ref 200–950)
Basophils Absolute: 38 cells/uL (ref 0–200)
Basophils Relative: 0.6 %
Eosinophils Absolute: 101 cells/uL (ref 15–500)
Eosinophils Relative: 1.6 %
HCT: 41.4 % (ref 35.0–45.0)
Hemoglobin: 13.9 g/dL (ref 11.7–15.5)
Lymphs Abs: 2142 cells/uL (ref 850–3900)
MCH: 29.2 pg (ref 27.0–33.0)
MCHC: 33.6 g/dL (ref 32.0–36.0)
MCV: 87 fL (ref 80.0–100.0)
MPV: 10.8 fL (ref 7.5–12.5)
Monocytes Relative: 7.1 %
Neutro Abs: 3572 cells/uL (ref 1500–7800)
Neutrophils Relative %: 56.7 %
Platelets: 425 10*3/uL — ABNORMAL HIGH (ref 140–400)
RBC: 4.76 10*6/uL (ref 3.80–5.10)
RDW: 13.6 % (ref 11.0–15.0)
Total Lymphocyte: 34 %
WBC: 6.3 10*3/uL (ref 3.8–10.8)

## 2020-12-11 LAB — COMPLETE METABOLIC PANEL WITH GFR
AG Ratio: 1.2 (calc) (ref 1.0–2.5)
ALT: 32 U/L — ABNORMAL HIGH (ref 6–29)
AST: 22 U/L (ref 10–35)
Albumin: 4.1 g/dL (ref 3.6–5.1)
Alkaline phosphatase (APISO): 86 U/L (ref 31–125)
BUN: 16 mg/dL (ref 7–25)
CO2: 27 mmol/L (ref 20–32)
Calcium: 10.1 mg/dL (ref 8.6–10.2)
Chloride: 106 mmol/L (ref 98–110)
Creat: 1.02 mg/dL (ref 0.50–1.10)
GFR, Est African American: 75 mL/min/{1.73_m2} (ref >=60)
GFR, Est Non African American: 65 mL/min/{1.73_m2} (ref >=60)
Globulin: 3.4 g/dL (calc) (ref 1.9–3.7)
Glucose, Bld: 92 mg/dL (ref 65–99)
Potassium: 3.4 mmol/L — ABNORMAL LOW (ref 3.5–5.3)
Sodium: 143 mmol/L (ref 135–146)
Total Bilirubin: 0.4 mg/dL (ref 0.2–1.2)
Total Protein: 7.5 g/dL (ref 6.1–8.1)

## 2020-12-11 LAB — TSH: TSH: 2.77 mIU/L

## 2020-12-11 LAB — PARATHYROID HORMONE, INTACT (NO CA): PTH: 32 pg/mL (ref 16–77)

## 2020-12-11 LAB — VITAMIN D 25 HYDROXY (VIT D DEFICIENCY, FRACTURES): Vit D, 25-Hydroxy: 59 ng/mL (ref 30–100)

## 2020-12-12 ENCOUNTER — Other Ambulatory Visit: Payer: Self-pay | Admitting: Family Medicine

## 2020-12-12 DIAGNOSIS — E1169 Type 2 diabetes mellitus with other specified complication: Secondary | ICD-10-CM

## 2020-12-12 NOTE — Telephone Encounter (Signed)
Approved per protocol.  Requested Prescriptions  Pending Prescriptions Disp Refills  . NOVOFINE AUTOCOVER PEN NEEDLE 30G X 8 MM MISC [Pharmacy Med Name: NOVOFINE AUTOCOVER 30G NEEDLE] 100 each 1    Sig: INJECT 1 EACH INTO THE SKIN AS NEEDED. AS DIRECTED     Endocrinology: Diabetes - Testing Supplies Passed - 12/12/2020  8:08 AM      Passed - Valid encounter within last 12 months    Recent Outpatient Visits          2 days ago Diabetes mellitus type 2 in obese [facility])   [facility] Steele Sizer, MD   3 months ago Uncontrolled type 2 diabetes mellitus with hyperglycemia [facility])   [facility] [place], Drue Stager, MD   5 months ago Diabetes mellitus type 2 in obese [facility])   [facility] Steele Sizer, MD   5 months ago Diabetes mellitus type 2 in obese [facility])   [facility] Steele Sizer, MD   6 months ago Uncontrolled type 2 diabetes mellitus with hyperglycemia [facility])   [facility] Steele Sizer, MD      Future Appointments            In 3 months Ancil Boozer, Drue Stager, MD [facility], [facility]

## 2020-12-15 ENCOUNTER — Encounter: Payer: Self-pay | Admitting: Family Medicine

## 2020-12-21 ENCOUNTER — Other Ambulatory Visit: Payer: Self-pay | Admitting: Family Medicine

## 2020-12-21 DIAGNOSIS — Z1231 Encounter for screening mammogram for malignant neoplasm of breast: Secondary | ICD-10-CM

## 2020-12-31 NOTE — Progress Notes (Signed)
NEUROLOGY FOLLOW UP OFFICE NOTE  Tiffany Nunez [id number]  Assessment/Plan:   Migraine without aura, without status migrainosus, not intractable  1.  Migraine prevention:  Increase nortriptyline to 180m at bedtime.  If no improvement in 6 weeks, will start Ajovy and reduce nortriptyline back to 536mat bedtime 2.  Migraine rescue:  She will try Elyxyb.  Cannot use a triptan due to anaphylaxis.   3.  For nausea:  Zofran 4.  Limit use of pain relievers to no more than 2 days out of week to prevent risk of rebound or medication-overuse headache. 5.  Keep headache diary 6.  Follow up 6 months.  Subjective:  [name] a [age]ear old right-handed female with diabetes, HTN, metabolic syndrome who follows up for migraines.  UPDATE: Nortriptyline has been titrated up to 5026mt bedtime. Nurtec ineffective for rescue.  She tried Reyvow but it made her groggy for well over 8 hours.  Intensity:  moderate Duration:  2 days Frequency:  Every other week Current NSAIDS/analgesics/migraine abortives:  Reyvow Current triptans:  none Current ergotamine:  none Current anti-emetic:  Zofran ODT 4mg43mrrent muscle relaxants:  none Current anti-anxiolytic:  none Current sleep aide:  none Current Antihypertensive medications:  Nadolol, olmesartan-amlodipine-HCTZ, hydralazine  Current Antidepressant medications:  Nortriptyline 50mg39mbedtime Current Anticonvulsant medications:  none Current anti-CGRP: none Current Vitamins/Herbal/Supplements:  none Current Antihistamines/Decongestants:  none Hormone/birth control:  none  Caffeine:  No coffee.  Stopped soda once diagnosed with diabetes.   Diet:  Improved water intake. Modified diet for diabetes.   Exercise:  Trying to start. Depression:  some; Anxiety:  yes.  Daughter recently in car accident.  Improving. Other pain:  no Sleep hygiene:  Poor.  Trouble staying asleep and falling asleep  HISTORY: She has had migraines since her 20s  38s worse over past year.  They are severe throbbing left or right frontal headache.  They may be preceded by zigzag lines in vision (sometimes with headache).  They are associated with nausea, photophobia, phonophobia, osmophobia, sometimes vomiting.  No numbness or weakness.  It may last 4 days where she is stuck in the bed, occurring at least once a month.  Others may last a day, occurring twice a week.  Triggers may include any smell (cologne, perfume) or change in barometric pressure.  No relieving factors.    She was diagnosed with diabetes in September.  She presented to the ED on 06/15/2020 for headache and increased blurred vision.  She missed her appointment with the ophthalmologist.  CT head without contrast personally reviewed was unremarkable.  She was treated with a headache cocktail of Toradol/Compazine/Benadryl.  Since treatment of diabetes, eye sight has improved.  MRI of brain without contrast from 05/14/2017 to evaluate headache following head trauma showed small chronic right cerebellar infarct but otherwise unremarkable.   Past NSAIDS/analgesics:  Fioricet, ibuprofen, Mobic, Excedrin, Tylenol, Stadol Past abortive triptans:  Sumatriptan (anaphylaxis) Past abortive ergotamine:  none Past muscle relaxants:  none Past anti-emetic:  promethazine Past antihypertensive medications:  HCTZ, losartan Past antidepressant medications:  Cymbalta Past anticonvulsant medications:  topiramate (paresthesias) Past anti-CGRP:  Aimovig 70mg,26melvy, Nurtec (rescue) Past vitamins/Herbal/Supplements:  none Past antihistamines/decongestants:  none Other past therapies:  none   Family history of headache:  No  PAST MEDICAL HISTORY: Past Medical History:  Diagnosis Date  . Anemia   . Depressive disorder   . Diabetes mellitus, type II (HCC)  [place] reflux   .  Essential hypertension, benign   . Genital herpes, unspecified   . Helicobacter pylori (H. pylori)   . Metabolic  syndrome   . Migraine, unspecified, without mention of intractable migraine without mention of status migrainosus   . Nonspecific abnormal electrocardiogram (ECG) (EKG)   . Nonspecific abnormal results of thyroid function study   . Obesity, unspecified   . Other and unspecified hyperlipidemia   . Unspecified vitamin D deficiency     MEDICATIONS: Current Outpatient Medications on File Prior to Visit  Medication Sig Dispense Refill  . ACCU-CHEK GUIDE test strip USE AS DIRECTED TO TEST FASTING BLOOD SUGAR TWICE A DAY 100 strip 2  . Accu-Chek Softclix Lancets lancets SMARTSIG:Topical    . atorvastatin (LIPITOR) 40 MG tablet Take 1 tablet (40 mg total) by mouth every other day. 45 tablet 1  . blood glucose meter kit and supplies Dispense based on patient and insurance preference. FSBS twice daily  (FOR ICD-10 E10.9, E11.9). 1 each 0  . dapagliflozin propanediol (FARXIGA) 10 MG TABS tablet Take 1 tablet (10 mg total) by mouth daily. 90 tablet 1  . fluticasone (FLONASE) 50 MCG/ACT nasal spray Place 2 sprays into both nostrils daily. (Patient not taking: Reported on 12/10/2020) 16 g 6  . ibuprofen (ADVIL) 800 MG tablet TAKE 1 TABLET BY MOUTH EVERY DAY AS NEEDED 90 tablet 0  . Lasmiditan Succinate 50 MG TABS Take 1 tablet by mouth daily as needed.    Marland Kitchen levothyroxine (SYNTHROID) 25 MCG tablet TAKE 1 TABLET BY MOUTH EVERY DAY BEFORE BREAKFAST 90 tablet 0  . nadolol (CORGARD) 80 MG tablet Take 1 tablet (80 mg total) by mouth daily. 90 tablet 0  . nortriptyline (PAMELOR) 50 MG capsule Take 1 capsule at bedtime 30 capsule 0  . NOVOFINE AUTOCOVER PEN NEEDLE 30G X 8 MM MISC INJECT 1 EACH INTO THE SKIN AS NEEDED. AS DIRECTED 100 each 1  . Olmesartan-amLODIPine-HCTZ 40-10-25 MG TABS Take 1 tablet by mouth daily. 90 tablet 1  . ondansetron (ZOFRAN-ODT) 4 MG disintegrating tablet TAKE 1 TABLET BY MOUTH EVERY 8 HOURS AS NEEDED FOR NAUSEA AND VOMITING 20 tablet 0  . Semaglutide, 1 MG/DOSE, (OZEMPIC, 1 MG/DOSE,) 4  MG/3ML SOPN Inject 1 mg into the skin once a week. 9 mL 1  . TRESIBA FLEXTOUCH 100 UNIT/ML FlexTouch Pen INJECT 30-50 UNITS INTO THE SKIN DAILY 15 mL 1  . Vitamin D, Ergocalciferol, (DRISDOL) 1.25 MG (50000 UNIT) CAPS capsule Take 1 capsule (50,000 Units total) by mouth every 7 (seven) days. 12 capsule 1   No current facility-administered medications on file prior to visit.    ALLERGIES: Allergies  Allergen Reactions  . Imitrex [Sumatriptan] Anaphylaxis  . Topamax [Topiramate] Other (See Comments)    Caused hand numbness   . Ubrogepant Other (See Comments)    [name] made her feel groggy the following day and bad taste in her month     FAMILY HISTORY: Family History  Problem Relation Age of Onset  . Hypertension Father   . Hyperlipidemia Father   . Hyperlipidemia Mother   . Hypertension Mother   . Hypertension Paternal Aunt   . Cancer Maternal Grandmother        breast      Objective:  Blood pressure 123/81, pulse 84, resp. rate 18, height '5\' 6"'  (1.676 m), weight 296 lb (134.3 kg), SpO2 99 %. General: No acute distress.  Patient appears well-groomed.   Head:  Normocephalic/atraumatic Eyes:  Fundi examined but not visualized Neck: supple, no  paraspinal tenderness, full range of motion Heart:  Regular rate and rhythm Lungs:  Clear to auscultation bilaterally Back: No paraspinal tenderness Neurological Exam: alert and oriented to person, place, and time. Attention span and concentration intact, recent and remote memory intact, fund of knowledge intact.  Speech fluent and not dysarthric, language intact.  CN II-XII intact. Bulk and tone normal, muscle strength 5/5 throughout.  Sensation to light touch, temperature and vibration intact.  Deep tendon reflexes 2+ throughout, toes downgoing.  Finger to nose and heel to shin testing intact.  Gait normal, Romberg negative.     Tiffany Clines, DO  CC: Steele Sizer, MD

## 2021-01-04 ENCOUNTER — Ambulatory Visit (INDEPENDENT_AMBULATORY_CARE_PROVIDER_SITE_OTHER): Payer: BC Managed Care – PPO | Admitting: Neurology

## 2021-01-04 ENCOUNTER — Encounter: Payer: Self-pay | Admitting: Neurology

## 2021-01-04 ENCOUNTER — Encounter: Payer: Self-pay | Admitting: Family Medicine

## 2021-01-04 ENCOUNTER — Other Ambulatory Visit: Payer: Self-pay

## 2021-01-04 DIAGNOSIS — G43009 Migraine without aura, not intractable, without status migrainosus: Secondary | ICD-10-CM

## 2021-01-04 MED ORDER — NORTRIPTYLINE HCL 50 MG PO CAPS: 100.0000 mg | ORAL_CAPSULE | Freq: Every day | ORAL | 5 refills | Status: DC

## 2021-01-04 MED ORDER — ONDANSETRON 4 MG PO TBDP: 4.0000 mg | ORAL_TABLET | Freq: Three times a day (TID) | ORAL | 5 refills | Status: DC | PRN

## 2021-01-04 MED ORDER — ELYXYB 120 MG/4.8ML PO SOLN: 120.0000 mg | Freq: Every day | ORAL | 5 refills | Status: DC | PRN

## 2021-01-04 NOTE — Patient Instructions (Signed)
1.  Increase nortriptyline to 100mg  at bedtime.  If no improvement in 6 weeks, contact me and we can start a new medication 2.  At earliest onset of migraine, take Elyxyb (no more than 1 in 24 hours).  Activate and use copay card.  Don't use ibuprofen 3.  Zofran refilled. 4.  Limit use of pain relievers to no more than 2 days out of week to prevent risk of rebound or medication-overuse headache. 5.  Keep headache diary 6.  Follow up 6 months.

## 2021-01-16 ENCOUNTER — Other Ambulatory Visit: Payer: Self-pay | Admitting: Family Medicine

## 2021-01-16 NOTE — Telephone Encounter (Signed)
Requested Prescriptions  Pending Prescriptions Disp Refills  . ACCU-CHEK GUIDE test strip [Pharmacy Med Name: ACCU-CHEK GUIDE TEST STRIP] 100 strip 2    Sig: USE AS DIRECTED TO TEST FASTING BLOOD SUGAR TWICE A DAY     Endocrinology: Diabetes - Testing Supplies Passed - 01/16/2021  9:28 AM      Passed - Valid encounter within last 12 months    Recent Outpatient Visits          1 month ago Diabetes mellitus type 2 in obese [facility])   [facility] [place], Drue Stager, MD   4 months ago Uncontrolled type 2 diabetes mellitus with hyperglycemia [facility])   [facility] [place], Drue Stager, MD   6 months ago Diabetes mellitus type 2 in obese [facility])   [facility] [place], Drue Stager, MD   7 months ago Diabetes mellitus type 2 in obese [facility])   [facility] Steele Sizer, MD   7 months ago Uncontrolled type 2 diabetes mellitus with hyperglycemia [facility])   [facility] Steele Sizer, MD      Future Appointments            In 2 months Ancil Boozer, Drue Stager, MD [facility], [facility] [place]

## 2021-01-29 ENCOUNTER — Other Ambulatory Visit: Payer: Self-pay | Admitting: Neurology

## 2021-02-02 ENCOUNTER — Other Ambulatory Visit: Payer: Self-pay | Admitting: Family Medicine

## 2021-02-02 DIAGNOSIS — E039 Hypothyroidism, unspecified: Secondary | ICD-10-CM

## 2021-02-02 DIAGNOSIS — E1169 Type 2 diabetes mellitus with other specified complication: Secondary | ICD-10-CM

## 2021-02-03 NOTE — Telephone Encounter (Signed)
Pt informed that prescription has been sent to pharmacy

## 2021-03-17 NOTE — Progress Notes (Signed)
Name: Tiffany Nunez   MRN: [id number]    DOB: 26-Aug-1971   Date:03/18/2021       Progress Note  Subjective  Chief Complaint  Follow Up  HPI  Chronic migraines: seeing Dr. Tomi Likens,  Currently on Nortriptyline at night and Celebrex liquid prn,  cannot take triptans  She states frequency of migraine has decrease and also the intensity. Episodes per week are down to 2 per week, she is able to go to work now for most episodes, she states only missed 3 days of work this month, she states episodes can last 2 days. She needs FMLA forms filled out. Migraines sometimes with aura, but other times just has pain on frontal area that radiates to the back of her head, described as throbbing, sometimes it is associated with nausea and vomiting   DMII: she is now on Tresiba  30 units , Ozempic 1 mg and Farxiga. Her glucose has been in the  low 100's fasting, she has episodes of hypoglycemia a couple of times a week. We will decrease Tresiba to 25 units and increase Ozempic to 2 mg weekly, explained needs to decrease portion size and monitor glucose every morning to continue to titrate [place] down. She denies polyphagia, polydipsia or polyuria Vision is back to normal She has dyslipidemia and obesity.   HTN: bp is at goal, no chest pain or palpitation. She is doing well on Nadolol daily. BP is at goal   Major Depression: she has a history of recurrent depression. She denies suicidal thoughts or ideation, she took Duloxetine for about 6 months from March until Maebelle Munroe has been off medicationn since 2021. She is doing well at this time   Dyslipidemia: Lipid panel improved, but LDL was still above 70, she is only taking statin therapy every other day , we will recheck next visit    Hypothyroidism: she stopped seeing [name]  Last TSH was within normal limits. Continue current dose , weight loss likely from Ozempic    Morbid obesity: she is cooking at home, avoiding sweets, losing weight, Ozempic is curbing  her appetite, she lost another 8 lbs since last visit    History of CVA:  She states she has occasional episodes of dizziness, but otherwise not problems. She has been taking Atorvastatin every other day, she was reminded again to take aspirin 81 mg daily    Chronic constipation: she states she has bowel movements a couple of times a week and stable. We will trulance. She has tried multiple medications but not sure of the names.   Urine odor : intermittent, no dysuria, sometimes odor is strong, denies fever or chills.   Patient Active Problem List   Diagnosis Date Noted   Mild episode of recurrent major depressive disorder ([place]) 05/28/2018   History of cerebrovascular accident (CVA) involving cerebellum 06/12/2017   Acquired autoimmune hypothyroidism 11/09/2016   Vitamin D deficiency 05/03/2015   Dyslipidemia 05/03/2015   Gastro-esophageal reflux disease without esophagitis 05/03/2015   Alopecia 05/03/2015   Genital herpes 95/63/8756   Dysmetabolic syndrome 43/32/9518   Positive H. pylori test 05/03/2015   Neuralgia neuritis, sciatic nerve 05/03/2015   Migraine without aura and responsive to treatment 04/30/2014   HTN (hypertension) 01/22/2014   Morbid obesity ([place]) 01/22/2014   Diabetes mellitus type 2 in obese ([place]) 01/22/2014   Decreased cardiac ejection fraction 01/22/2014    Past Surgical History:  Procedure Laterality Date   ABDOMINAL HYSTERECTOMY     ANKLE SURGERY  right   TUBAL LIGATION      Family History  Problem Relation Age of Onset   Hypertension Father    Hyperlipidemia Father    Hyperlipidemia Mother    Hypertension Mother    Hypertension Paternal Aunt    Cancer Maternal Grandmother        breast    Social History   Tobacco Use   Smoking status: Never   Smokeless tobacco: Never  Substance Use Topics   Alcohol use: No    Alcohol/week: 0.0 standard drinks     Current Outpatient Medications:    ACCU-CHEK GUIDE test strip, USE AS DIRECTED TO  TEST FASTING BLOOD SUGAR TWICE A DAY, Disp: 100 strip, Rfl: 2   Accu-Chek Softclix Lancets lancets, SMARTSIG:Topical, Disp: , Rfl:    atorvastatin (LIPITOR) 40 MG tablet, Take 1 tablet (40 mg total) by mouth every other day., Disp: 45 tablet, Rfl: 1   blood glucose meter kit and supplies, Dispense based on patient and insurance preference. FSBS twice daily  (FOR ICD-10 E10.9, E11.9)., Disp: 1 each, Rfl: 0   Celecoxib (ELYXYB) 120 MG/4.8ML SOLN, Take 120 mg by mouth daily as needed (Maximum 1 in 24 hours)., Disp: 28.8 mL, Rfl: 5   dapagliflozin propanediol (FARXIGA) 10 MG TABS tablet, Take 1 tablet (10 mg total) by mouth daily., Disp: 90 tablet, Rfl: 1   levothyroxine (SYNTHROID) 25 MCG tablet, TAKE 1 TABLET BY MOUTH EVERY DAY BEFORE BREAKFAST, Disp: 90 tablet, Rfl: 2   nortriptyline (PAMELOR) 50 MG capsule, TAKE 2 CAPSULES (100 MG TOTAL) BY MOUTH AT BEDTIME., Disp: 180 capsule, Rfl: 0   NOVOFINE AUTOCOVER PEN NEEDLE 30G X 8 MM MISC, INJECT 1 EACH INTO THE SKIN AS NEEDED. AS DIRECTED, Disp: 100 each, Rfl: 1   ondansetron (ZOFRAN ODT) 4 MG disintegrating tablet, Take 1 tablet (4 mg total) by mouth every 8 (eight) hours as needed for nausea or vomiting., Disp: 20 tablet, Rfl: 5   Semaglutide, 2 MG/DOSE, 8 MG/3ML SOPN, Inject 2 mg into the skin once a week., Disp: 9 mL, Rfl: 1   TRESIBA FLEXTOUCH 100 UNIT/ML FlexTouch Pen, INJECT 30-50 UNITS INTO THE SKIN DAILY, Disp: 15 mL, Rfl: 0   Vitamin D, Ergocalciferol, (DRISDOL) 1.25 MG (50000 UNIT) CAPS capsule, Take 1 capsule (50,000 Units total) by mouth every 7 (seven) days., Disp: 12 capsule, Rfl: 1   nadolol (CORGARD) 80 MG tablet, Take 1 tablet (80 mg total) by mouth daily., Disp: 90 tablet, Rfl: 0   Olmesartan-amLODIPine-HCTZ 40-10-25 MG TABS, Take 1 tablet by mouth daily., Disp: 90 tablet, Rfl: 1  Allergies  Allergen Reactions   Imitrex [Sumatriptan] Anaphylaxis   Topamax [Topiramate] Other (See Comments)    Caused hand numbness    Ubrogepant Other  (See Comments)    Ubrelvy made her feel groggy the following day and bad taste in her month     I personally reviewed active problem list, medication list, allergies, family history, social history, health maintenance with the patient/caregiver today.   ROS  Constitutional: Negative for fever , positive for weight change.  Respiratory: Negative for cough and shortness of breath.   Cardiovascular: Negative for chest pain or palpitations.  Gastrointestinal: Negative for abdominal pain, no bowel changes.  Musculoskeletal: Negative for gait problem or joint swelling.  Skin: Negative for rash.  Neurological: Negative for dizziness or headache.  No other specific complaints in a complete review of systems (except as listed in HPI above).   Objective  Vitals:   03/18/21  0819  BP: 126/84  Pulse: 86  Resp: 16  Temp: 98.2 F (36.8 C)  TempSrc: Oral  SpO2: 97%  Weight: 291 lb (132 kg)  Height: '5\' 6"'  (1.676 m)    Body mass index is 46.97 kg/m.  Physical Exam  Constitutional: Patient appears well-developed and well-nourished. Obese  No distress.  HEENT: head atraumatic, normocephalic, pupils equal and reactive to light, neck supple, Cardiovascular: Normal rate, regular rhythm and normal heart sounds.  No murmur heard. No BLE edema. Pulmonary/Chest: Effort normal and breath sounds normal. No respiratory distress. Abdominal: Soft.  There is no tenderness. Psychiatric: Patient has a normal mood and affect. behavior is normal. Judgment and thought content normal.  Recent Results (from the past 2160 hour(s))  POCT HgB A1C     Status: Abnormal   Collection Time: 03/18/21  8:20 AM  Result Value Ref Range   Hemoglobin A1C 5.9 (A) 4.0 - 5.6 %   HbA1c POC (<> result, manual entry)     HbA1c, POC (prediabetic range)     HbA1c, POC (controlled diabetic range)    POCT Urinalysis Dipstick     Status: Abnormal   Collection Time: 03/18/21  8:20 AM  Result Value Ref Range   Color, UA Yellow     Clarity, UA Clear    Glucose, UA Positive (A) Negative    Comment: Trace   Bilirubin, UA Negative    Ketones, UA Negative    Spec Grav, UA 1.020 1.010 - 1.025   Blood, UA Negative    pH, UA 6.0 5.0 - 8.0   Protein, UA Negative Negative   Urobilinogen, UA 0.2 0.2 or 1.0 E.U./dL   Nitrite, UA Negative    Leukocytes, UA Negative Negative   Appearance     Odor       PHQ2/9: Depression screen [facility] 2/9 03/18/2021 12/10/2020 09/07/2020 07/01/2020 06/18/2020  Decreased Interest 0 0 0 0 1  Down, Depressed, Hopeless 0 0 0 0 1  PHQ - 2 Score 0 0 0 0 2  Altered sleeping 1 1 - - 1  Tired, decreased energy 0 1 - - 1  Change in appetite 0 0 - - 1  Feeling bad or failure about yourself  0 0 - - 0  Trouble concentrating 0 0 - - 0  Moving slowly or fidgety/restless 0 0 - - 0  Suicidal thoughts 0 0 - - 0  PHQ-9 Score 1 2 - - 5  Difficult doing work/chores - - - - Somewhat difficult  Some recent data might be hidden    phq 9 is negative   Fall Risk: Fall Risk  03/18/2021 01/04/2021 12/10/2020 09/07/2020 07/01/2020  Falls in the past year? 0 0 0 0 0  Number falls in past yr: 0 0 0 0 0  Injury with Fall? 0 0 0 0 0  Risk for fall due to : - - - - -  Follow up - - - - -      Functional Status Survey: Is the patient deaf or have difficulty hearing?: No Does the patient have difficulty seeing, even when wearing glasses/contacts?: No Does the patient have difficulty concentrating, remembering, or making decisions?: No Does the patient have difficulty walking or climbing stairs?: No Does the patient have difficulty dressing or bathing?: No Does the patient have difficulty doing errands alone such as visiting a doctor's office or shopping?: No    Assessment & Plan  1. Diabetes mellitus type 2 in obese (HCC)  -  POCT HgB A1C - Semaglutide, 2 MG/DOSE, 8 MG/3ML SOPN; Inject 2 mg into the skin once a week.  Dispense: 9 mL; Refill: 1  2. Abnormal urine odor  - POCT Urinalysis Dipstick -  Cervicovaginal ancillary only - CULTURE, URINE COMPREHENSIVE  3. Migraine without aura and responsive to treatment   4. Morbid obesity ([place])  Discussed with the patient the risk posed by an increased BMI. Discussed importance of portion control, calorie counting and at least 150 minutes of physical activity weekly. Avoid sweet beverages and drink more water. Eat at least 6 servings of fruit and vegetables daily    5. Vitamin D deficiency   6. Essential hypertension  - nadolol (CORGARD) 80 MG tablet; Take 1 tablet (80 mg total) by mouth daily.  Dispense: 90 tablet; Refill: 0 - Olmesartan-amLODIPine-HCTZ 40-10-25 MG TABS; Take 1 tablet by mouth daily.  Dispense: 90 tablet; Refill: 1  7. Dyslipidemia associated with type 2 diabetes mellitus ([place])   8. Hypothyroidism, adult   9. Chronic idiopathic constipation  - Plecanatide (TRULANCE) 3 MG TABS; Take 1 tablet by mouth daily.  Dispense: 90 tablet; Refill: 0  10. Major depression in remission ([place])   11. Hypertension associated with type 2 diabetes mellitus ([place])

## 2021-03-18 ENCOUNTER — Ambulatory Visit (INDEPENDENT_AMBULATORY_CARE_PROVIDER_SITE_OTHER): Payer: BC Managed Care – PPO | Admitting: Family Medicine

## 2021-03-18 ENCOUNTER — Other Ambulatory Visit: Payer: Self-pay | Admitting: Family Medicine

## 2021-03-18 ENCOUNTER — Encounter: Payer: Self-pay | Admitting: Family Medicine

## 2021-03-18 ENCOUNTER — Other Ambulatory Visit (HOSPITAL_COMMUNITY)
Admission: RE | Admit: 2021-03-18 | Discharge: 2021-03-18 | Disposition: A | Discharge status: Home / Self Care | Payer: BC Managed Care – PPO | Source: Ambulatory Visit | Attending: Family Medicine | Admitting: Family Medicine

## 2021-03-18 ENCOUNTER — Other Ambulatory Visit: Payer: Self-pay

## 2021-03-18 VITALS — BP 126/84 | HR 86 | Temp 98.2°F | Resp 16 | Ht 66.0 in | Wt 291.0 lb

## 2021-03-18 DIAGNOSIS — I10 Essential (primary) hypertension: Secondary | ICD-10-CM

## 2021-03-18 DIAGNOSIS — E669 Obesity, unspecified: Secondary | ICD-10-CM | POA: Diagnosis not present

## 2021-03-18 DIAGNOSIS — E559 Vitamin D deficiency, unspecified: Secondary | ICD-10-CM

## 2021-03-18 DIAGNOSIS — I152 Hypertension secondary to endocrine disorders: Secondary | ICD-10-CM

## 2021-03-18 DIAGNOSIS — R350 Frequency of micturition: Secondary | ICD-10-CM | POA: Diagnosis present

## 2021-03-18 DIAGNOSIS — E1159 Type 2 diabetes mellitus with other circulatory complications: Secondary | ICD-10-CM

## 2021-03-18 DIAGNOSIS — E1169 Type 2 diabetes mellitus with other specified complication: Secondary | ICD-10-CM | POA: Diagnosis not present

## 2021-03-18 DIAGNOSIS — F325 Major depressive disorder, single episode, in full remission: Secondary | ICD-10-CM

## 2021-03-18 DIAGNOSIS — R8290 Unspecified abnormal findings in urine: Secondary | ICD-10-CM | POA: Insufficient documentation

## 2021-03-18 DIAGNOSIS — E785 Hyperlipidemia, unspecified: Secondary | ICD-10-CM

## 2021-03-18 DIAGNOSIS — G43009 Migraine without aura, not intractable, without status migrainosus: Secondary | ICD-10-CM | POA: Diagnosis not present

## 2021-03-18 DIAGNOSIS — E039 Hypothyroidism, unspecified: Secondary | ICD-10-CM

## 2021-03-18 DIAGNOSIS — K5904 Chronic idiopathic constipation: Secondary | ICD-10-CM

## 2021-03-18 LAB — POCT URINALYSIS DIPSTICK
Bilirubin, UA: NEGATIVE
Blood, UA: NEGATIVE
Glucose, UA: POSITIVE — AB
Ketones, UA: NEGATIVE
Leukocytes, UA: NEGATIVE
Nitrite, UA: NEGATIVE
Protein, UA: NEGATIVE
Spec Grav, UA: 1.02 (ref 1.010–1.025)
Urobilinogen, UA: 0.2 E.U./dL
pH, UA: 6 (ref 5.0–8.0)

## 2021-03-18 LAB — POCT GLYCOSYLATED HEMOGLOBIN (HGB A1C): Hemoglobin A1C: 5.9 % — AB (ref 4.0–5.6)

## 2021-03-18 MED ORDER — SEMAGLUTIDE (2 MG/DOSE) 8 MG/3ML ~~LOC~~ SOPN: 2.0000 mg | PEN_INJECTOR | SUBCUTANEOUS | 1 refills | Status: DC

## 2021-03-18 MED ORDER — NADOLOL 80 MG PO TABS: 80.0000 mg | ORAL_TABLET | Freq: Every day | ORAL | 0 refills | Status: DC

## 2021-03-18 MED ORDER — OLMESARTAN-AMLODIPINE-HCTZ 40-10-25 MG PO TABS: 1.0000 | ORAL_TABLET | Freq: Every day | ORAL | 1 refills | Status: DC

## 2021-03-18 MED ORDER — TRULANCE 3 MG PO TABS: 1.0000 | ORAL_TABLET | Freq: Every day | ORAL | 0 refills | Status: DC

## 2021-03-18 NOTE — Telephone Encounter (Signed)
   Notes to clinic: Alternative Requested:THE PRESCRIBED MEDICATION IS NOT COVERED BY INSURANCE. PLEASE CONSIDER CHANGING TO ONE OF THE SUGGESTED COVERED ALTERNATIVES    Requested Prescriptions  Pending Prescriptions Disp Refills   LINZESS 290 MCG CAPS capsule [Pharmacy Med Name: LINZESS [place]  0      Gastroenterology: Irritable Bowel Syndrome Passed - 03/18/2021  8:53 AM      Passed - Valid encounter within last 12 months    Recent Outpatient Visits           Today Diabetes mellitus type 2 in obese [facility])   [facility]   3 months ago Diabetes mellitus type 2 in obese [facility])   [facility] [place], Drue Stager, MD   6 months ago Uncontrolled type 2 diabetes mellitus with hyperglycemia [facility])   [facility] [place], Drue Stager, MD   8 months ago Diabetes mellitus type 2 in obese [facility]   [facility] Steele Sizer, MD   9 months ago Diabetes mellitus type 2 in obese [facility])   [facility] Steele Sizer, MD       Future Appointments             In 3 months Ancil Boozer, Drue Stager, MD [facility], [facility]

## 2021-03-18 NOTE — Patient Instructions (Signed)
Go down on Tresiba to 25 units and if remains between 100-120 you can go down to 20 units

## 2021-03-19 LAB — CERVICOVAGINAL ANCILLARY ONLY
Bacterial Vaginitis (gardnerella): NEGATIVE
Candida Glabrata: NEGATIVE
Candida Vaginitis: NEGATIVE
Comment: NEGATIVE
Comment: NEGATIVE
Comment: NEGATIVE

## 2021-03-20 LAB — CULTURE, URINE COMPREHENSIVE
MICRO NUMBER:: 12071509
SPECIMEN QUALITY:: ADEQUATE

## 2021-03-25 ENCOUNTER — Encounter: Payer: Self-pay | Admitting: Family Medicine

## 2021-04-18 ENCOUNTER — Other Ambulatory Visit: Payer: Self-pay | Admitting: Family Medicine

## 2021-04-18 DIAGNOSIS — E1169 Type 2 diabetes mellitus with other specified complication: Secondary | ICD-10-CM

## 2021-04-18 DIAGNOSIS — E669 Obesity, unspecified: Secondary | ICD-10-CM

## 2021-04-18 NOTE — Telephone Encounter (Signed)
Requested Prescriptions  Pending Prescriptions Disp Refills  . TRESIBA FLEXTOUCH 100 UNIT/ML FlexTouch Pen [Pharmacy Med Name: TRESIBA FLEXTOUCH 100 UNIT/ML] 15 mL     Sig: INJECT 30-50 UNITS UNDER THE SKIN DAILY     Endocrinology:  Diabetes - Insulins Passed - 04/18/2021  1:12 AM      Passed - HBA1C is between 0 and 7.9 and within 180 days    Hemoglobin A1C  Date Value Ref Range Status  03/18/2021 5.9 (A) 4.0 - 5.6 % Final  12/30/2011 5.9 4.2 - 6.3 % Final    Comment:    The American Diabetes Association recommends that a primary goal of therapy should be <7% and that physicians should reevaluate the treatment regimen in patients with HbA1c values consistently >8%.    Hgb A1c MFr Bld  Date Value Ref Range Status  09/07/2020 7.0 (H) <5.7 % of total Hgb Final    Comment:    For someone without known diabetes, a hemoglobin A1c value of 6.5% or greater indicates that they may have  diabetes and this should be confirmed with a follow-up  test. . For someone with known diabetes, a value <7% indicates  that their diabetes is well controlled and a value  greater than or equal to 7% indicates suboptimal  control. A1c targets should be individualized based on  duration of diabetes, age, comorbid conditions, and  other considerations. . Currently, no consensus exists regarding use of hemoglobin A1c for diagnosis of diabetes for children. Renella Cunas - Valid encounter within last 6 months    Recent Outpatient Visits          1 month ago Diabetes mellitus type 2 in obese [facility])   [facility] [place], Drue Stager, MD   4 months ago Diabetes mellitus type 2 in obese [facility])   [facility] Steele Sizer, MD   7 months ago Uncontrolled type 2 diabetes mellitus with hyperglycemia [facility])   [facility] [place], Drue Stager, MD   9 months ago Diabetes mellitus type 2 in obese [facility])   [facility] Steele Sizer, MD    10 months ago Diabetes mellitus type 2 in obese [facility])   [facility] Steele Sizer, MD      Future Appointments            In 2 months Ancil Boozer, Drue Stager, MD [facility], [facility]

## 2021-05-25 ENCOUNTER — Encounter: Payer: Self-pay | Admitting: Family Medicine

## 2021-05-26 NOTE — Telephone Encounter (Signed)
Ok then yes can we see about getting her samples while we work on trying to get her patient assistance through the manufacturers? I will have my team start working on the PAP for her. She can also reach out to the medication management clinic if she has trouble with any of her other medications.   Thanks!  Malva Limes, [facility] [phone]

## 2021-05-26 NOTE — Telephone Encounter (Signed)
Reached out to drug reps for [place] and they will bring over samples to help patient in the meantime.

## 2021-05-26 NOTE — Telephone Encounter (Signed)
Katharine Look,  Can we get Ms. Metheny copay cards for her Wilder Glade and Ozempic? That may help bring down her copay for some time. Tami can we please start working on patient assistance for this patient.   Thanks, Malva Limes, [facility] [phone]

## 2021-06-01 NOTE — Telephone Encounter (Signed)
Copied from [place] [id number]. Topic: General - Other >> Jun 01, 2021 11:43 AM Pawlus, Brayton Layman A wrote: Reason for CRM: Pt requested a call back regarding MyChart messages she has sent previously regarding Medication, Pt stated she has not heard anything back yet, please advise.

## 2021-06-01 NOTE — Telephone Encounter (Signed)
Just received the [place] today and notified patient she could pick up. She stated she would be sending one of her daughters.

## 2021-06-23 ENCOUNTER — Ambulatory Visit: Payer: BC Managed Care – PPO | Admitting: Family Medicine

## 2021-07-05 NOTE — Progress Notes (Signed)
Virtual Visit via Video Note The purpose of this virtual visit is to provide medical care while limiting exposure to the novel coronavirus.    Consent was obtained for video visit:  Yes.   Answered questions that patient had about telehealth interaction:  Yes.   I discussed the limitations, risks, security and privacy concerns of performing an evaluation and management service by telemedicine. I also discussed with the patient that there may be a patient responsible charge related to this service. The patient expressed understanding and agreed to proceed.  Pt location: Home Physician Location: office Name of referring provider:  Steele Sizer, MD I connected with Tiffany Nunez at patients initiation/request on 07/07/2021 at  8:30 AM EDT by video enabled telemedicine application and verified that I am speaking with the correct person using two identifiers. Pt MRN:  [id number] Pt DOB:  Oct 06, 1970 Video Participants:  Tiffany Nunez;  Assessment and Plan:   Tiffany Nunez is a 50 year old right-handed female with diabetes, HTN, metabolic syndrome who follows up for migraines.   UPDATE: Nortriptyline was increased in April.  Improved.   Intensity:  moderate Duration:  within 30 minutes or so with Elyxyb Frequency:  Usually less than one a month.  Over the past 4 weeks, she had about 4 due to the weather. Current NSAIDS/analgesics/migraine abortives:  Elyxyb Current triptans:  none Current ergotamine:  none Current anti-emetic:  Zofran ODT 15m Current muscle relaxants:  none Current anti-anxiolytic:  none Current sleep aide:  none Current Antihypertensive medications:  Nadolol, olmesartan-amlodipine-HCTZ, hydralazine  Current Antidepressant medications:  Nortriptyline 1054mat bedtime Current Anticonvulsant medications:  none Current anti-CGRP: none Current Vitamins/Herbal/Supplements:  none Current Antihistamines/Decongestants:  none Hormone/birth control:  none   Caffeine:   No coffee.  Stopped soda once diagnosed with diabetes.   Diet:  Improved water intake. Modified diet for diabetes.   Exercise:  Trying to start. Depression:  some; Anxiety:  yes.  Daughter recently in car accident.  Improving. Other pain:  no Sleep hygiene:  Poor.  Trouble staying asleep and falling asleep   HISTORY:  She has had migraines since her [age] worse over past year.  They are severe throbbing left or right frontal headache.  They may be preceded by zigzag lines in vision (sometimes with headache).  They are associated with nausea, photophobia, phonophobia, osmophobia, sometimes vomiting.  No numbness or weakness.  It may last 4 days where she is stuck in the bed, occurring at least once a month.  Others may last a day, occurring twice a week.  Triggers may include any smell (cologne, perfume) or change in barometric pressure.  No relieving factors.     She was diagnosed with diabetes in September.  She presented to the ED on 06/15/2020 for headache and increased blurred vision.  She missed her appointment with the ophthalmologist.  CT head without contrast personally reviewed was unremarkable.  She was treated with a headache cocktail of Toradol/Compazine/Benadryl.  Since treatment of diabetes, eye sight has improved.   MRI of brain without contrast from 05/14/2017 to evaluate headache following head trauma showed small chronic right cerebellar infarct but otherwise unremarkable.     Past NSAIDS/analgesics/migraine abortives:  Fioricet, ibuprofen, Mobic, Excedrin, Tylenol, Stadol, Reyvow Past abortive triptans:  Sumatriptan (anaphylaxis) Past abortive ergotamine:  none Past muscle relaxants:  none Past anti-emetic:  promethazine Past antihypertensive medications:  HCTZ, losartan Past antidepressant medications:  Cymbalta Past anticonvulsant medications:  topiramate (paresthesias) Past anti-CGRP:  Aimovig  72m, URoselyn Meier Nurtec (rescue) Past vitamins/Herbal/Supplements:  none Past  antihistamines/decongestants:  none Other past therapies:  none     Family history of headache:  No  History of Present Illness:  Migraine without aura, without status migrainosus, not intractable  Migraine prevention:  nortriptyline 1051mat bedtime Migraine rescue:  Elyxyb Limit use of pain relievers to no more than 2 days out of week to prevent risk of rebound or medication-overuse headache. Keep headache diary Follow up 6 months.  Past Medical History: Past Medical History:  Diagnosis Date   Anemia    Depressive disorder    Diabetes mellitus, type II (HCC)    Esophageal reflux    Essential hypertension, benign    Genital herpes, unspecified    Helicobacter pylori (H. pylori)    Metabolic syndrome    Migraine, unspecified, without mention of intractable migraine without mention of status migrainosus    Nonspecific abnormal electrocardiogram (ECG) (EKG)    Nonspecific abnormal results of thyroid function study    Obesity, unspecified    Other and unspecified hyperlipidemia    Unspecified vitamin D deficiency     Medications: Outpatient Encounter Medications as of 07/07/2021  Medication Sig   ACCU-CHEK GUIDE test strip USE AS DIRECTED TO TEST FASTING BLOOD SUGAR TWICE A DAY   Accu-Chek Softclix Lancets lancets SMARTSIG:Topical   atorvastatin (LIPITOR) 40 MG tablet Take 1 tablet (40 mg total) by mouth every other day.   blood glucose meter kit and supplies Dispense based on patient and insurance preference. FSBS twice daily  (FOR ICD-10 E10.9, E11.9).   Celecoxib (ELYXYB) 120 MG/4.8ML SOLN Take 120 mg by mouth daily as needed (Maximum 1 in 24 hours).   dapagliflozin propanediol (FARXIGA) 10 MG TABS tablet Take 1 tablet (10 mg total) by mouth daily.   levothyroxine (SYNTHROID) 25 MCG tablet TAKE 1 TABLET BY MOUTH EVERY DAY BEFORE BREAKFAST   nadolol (CORGARD) 80 MG tablet Take 1 tablet (80 mg total) by mouth daily.   nortriptyline (PAMELOR) 50 MG capsule TAKE 2 CAPSULES  (100 MG TOTAL) BY MOUTH AT BEDTIME.   NOVOFINE AUTOCOVER PEN NEEDLE 30G X 8 MM MISC INJECT 1 EACH INTO THE SKIN AS NEEDED. AS DIRECTED   Olmesartan-amLODIPine-HCTZ 40-10-25 MG TABS Take 1 tablet by mouth daily.   ondansetron (ZOFRAN ODT) 4 MG disintegrating tablet Take 1 tablet (4 mg total) by mouth every 8 (eight) hours as needed for nausea or vomiting.   Semaglutide, 2 MG/DOSE, 8 MG/3ML SOPN Inject 2 mg into the skin once a week.   TRESIBA FLEXTOUCH 100 UNIT/ML FlexTouch Pen INJECT 30-50 UNITS UNDER THE SKIN DAILY   Vitamin D, Ergocalciferol, (DRISDOL) 1.25 MG (50000 UNIT) CAPS capsule Take 1 capsule (50,000 Units total) by mouth every 7 (seven) days.   linaclotide (LINZESS) 145 MCG CAPS capsule Take 1 capsule (145 mcg total) by mouth daily before breakfast. (Patient not taking: Reported on 07/07/2021)   No facility-administered encounter medications on file as of 07/07/2021.    Allergies: Allergies  Allergen Reactions   Imitrex [Sumatriptan] Anaphylaxis   Topamax [Topiramate] Other (See Comments)    Caused hand numbness    Ubrogepant Other (See Comments)    Ubrelvy made her feel groggy the following day and bad taste in her month     Family History: Family History  Problem Relation Age of Onset   Hypertension Father    Hyperlipidemia Father    Hyperlipidemia Mother    Hypertension Mother    Hypertension Paternal Aunt  Cancer Maternal Grandmother        breast    Observations/Objective:   Height '5\' 7"'  (1.702 m), weight 280 lb (127 kg). No acute distress.  Alert and oriented.  Speech fluent and not dysarthric.  Language intact.     Follow Up Instructions:    -I discussed the assessment and treatment plan with the patient. The patient was provided an opportunity to ask questions and all were answered. The patient agreed with the plan and demonstrated an understanding of the instructions.   The patient was advised to call back or seek an in-person evaluation if the  symptoms worsen or if the condition fails to improve as anticipated.    Dudley Major, DO

## 2021-07-07 ENCOUNTER — Telehealth (INDEPENDENT_AMBULATORY_CARE_PROVIDER_SITE_OTHER): Payer: Self-pay | Admitting: Neurology

## 2021-07-07 ENCOUNTER — Encounter: Payer: Self-pay | Admitting: Neurology

## 2021-07-07 ENCOUNTER — Other Ambulatory Visit: Payer: Self-pay

## 2021-07-07 VITALS — Ht 67.0 in | Wt 280.0 lb

## 2021-07-07 DIAGNOSIS — G43009 Migraine without aura, not intractable, without status migrainosus: Secondary | ICD-10-CM

## 2021-07-15 ENCOUNTER — Other Ambulatory Visit: Payer: Self-pay | Admitting: Family Medicine

## 2021-07-15 DIAGNOSIS — G43009 Migraine without aura, not intractable, without status migrainosus: Secondary | ICD-10-CM

## 2021-07-15 NOTE — Telephone Encounter (Signed)
Requested medications are due for refill today.  unknown  Requested medications are on the active medications list.  no  Last refill. 08/04/2020  Future visit scheduled.   no  Notes to clinic.  Medication was d/c'd by Dr. Tomi Likens 01/04/2021.

## 2021-08-02 IMAGING — CT CT ABDOMEN W/ CM
3 of 5 series · 13 of 46 positions shown, 19 images · IV contrast (APPLIED)
Comparison: Ultrasound on 06/04/2020

CLINICAL DATA: Left upper quadrant pain. Indeterminate liver lesion
seen on recent ultrasound.

EXAM:
CT ABDOMEN WITH CONTRAST
TECHNIQUE: Multidetector CT imaging of the abdomen was performed using the
standard protocol following bolus administration of intravenous
contrast.
CONTRAST:  100mL OMNIPAQUE IOHEXOL 300 MG/ML  SOLN

[Series 2: axial st · axial · 0.79mm/px · z∈[+1241,+1496]mm · 9 of 65 slices shown, 15 images]
[im 7/65  soft-tissue]
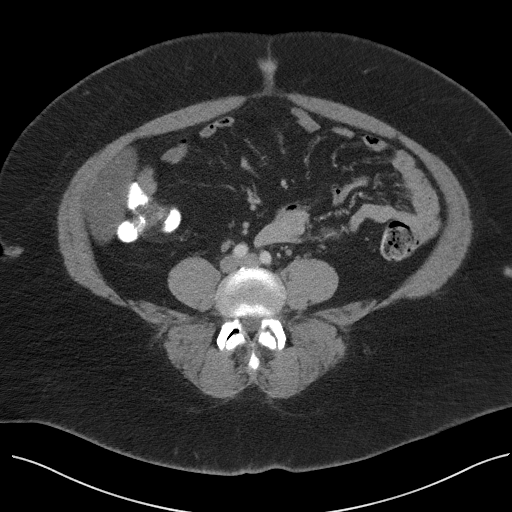
[im 7/65  bone]
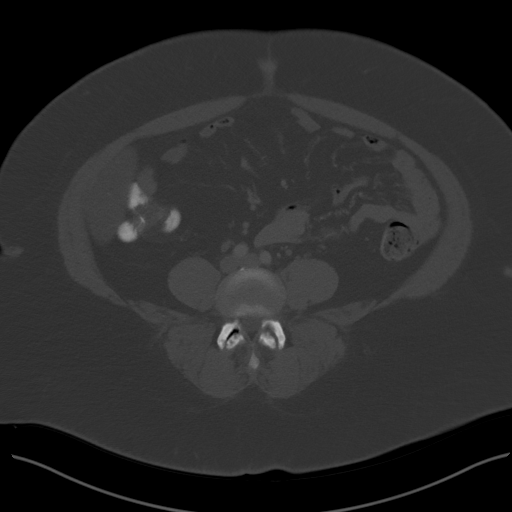
[im 13/65  soft-tissue]
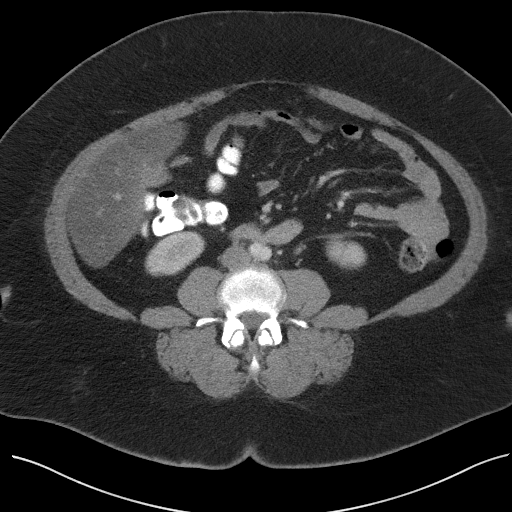
[im 20/65  soft-tissue]
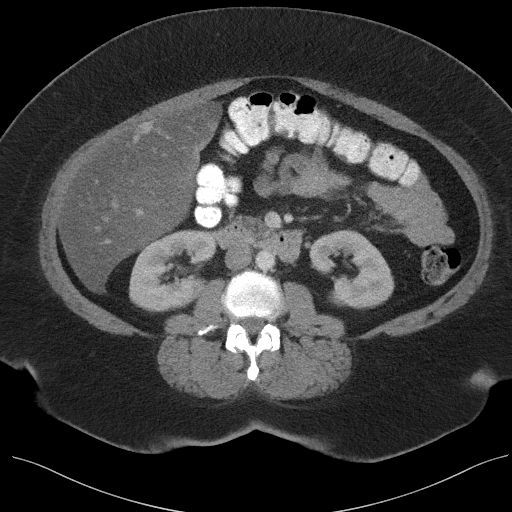
[im 26/65  soft-tissue]
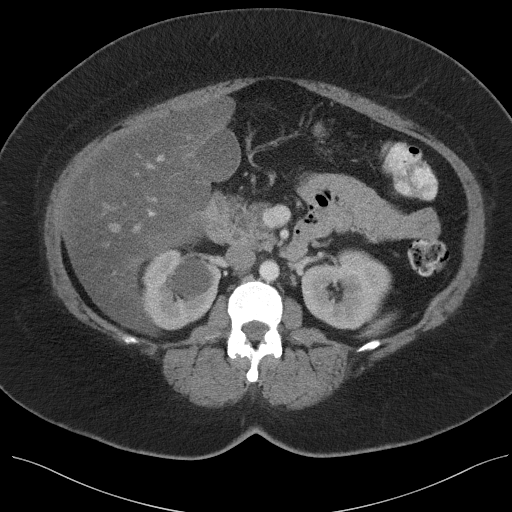
[im 33/65  soft-tissue]
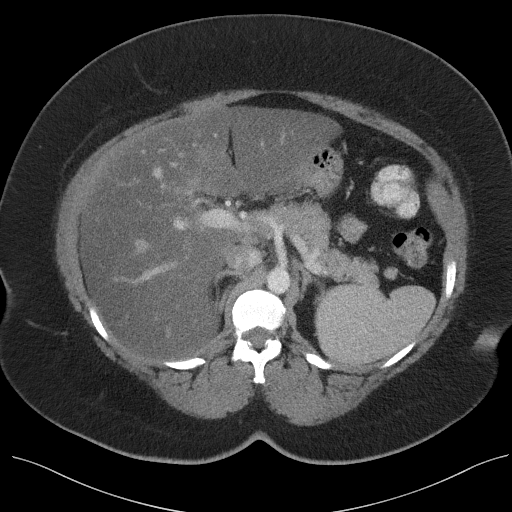
[im 39/65  soft-tissue]
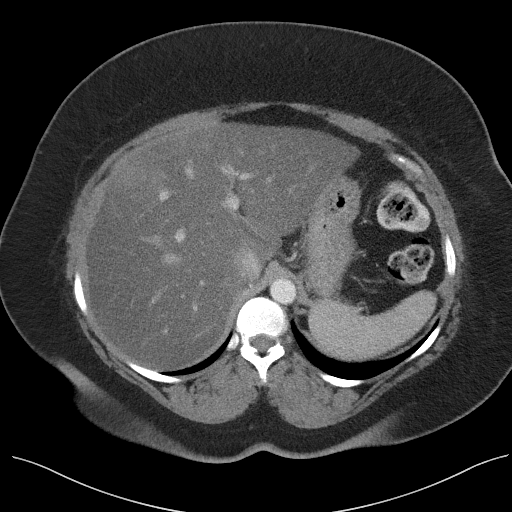
[im 39/65  lung]
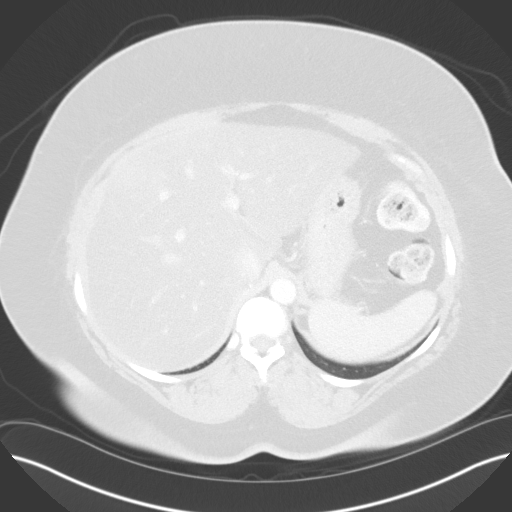
[im 45/65  soft-tissue]
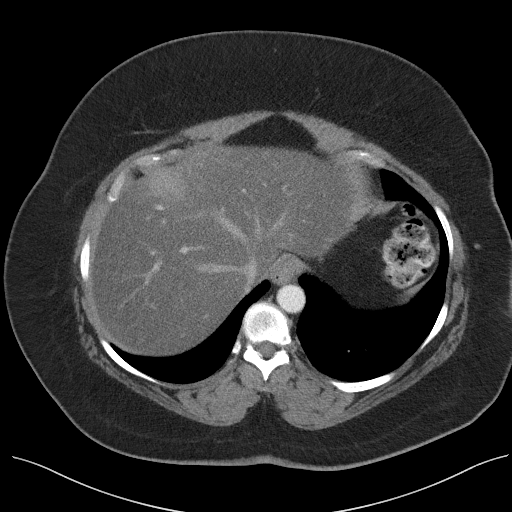
[im 45/65  lung]
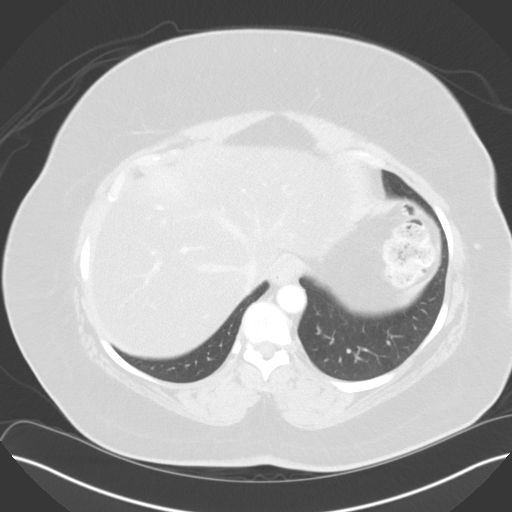
[im 52/65  soft-tissue]
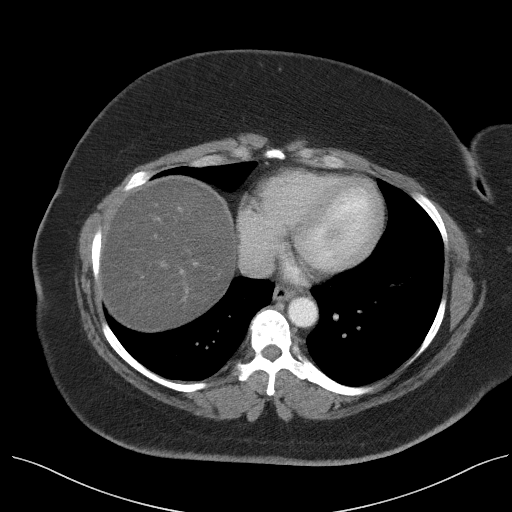
[im 52/65  lung]
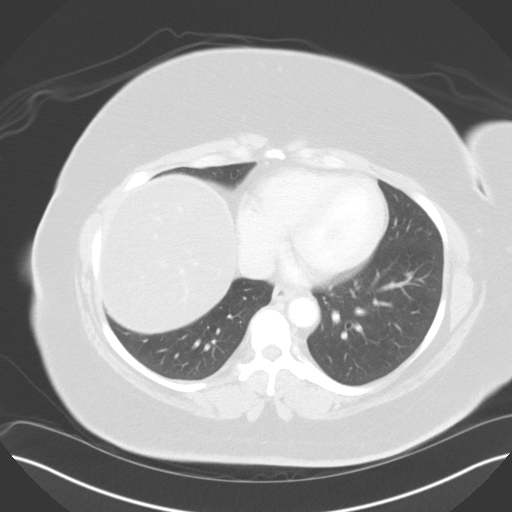
[im 58/65  soft-tissue]
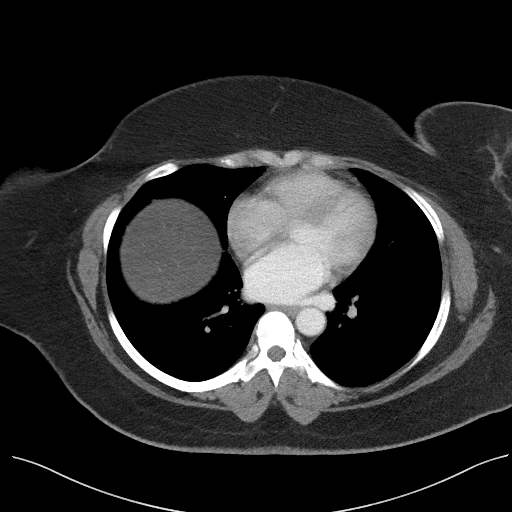
[im 58/65  lung]
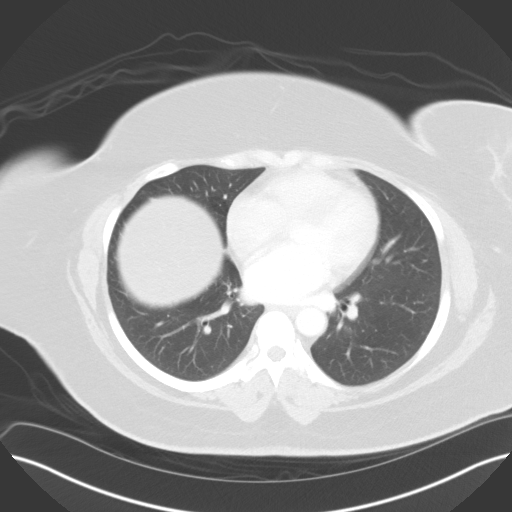
[im 58/65  bone]
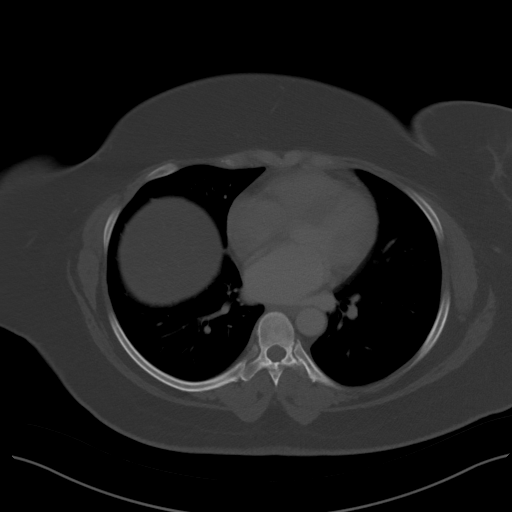

[Series 5: coronal st · coronal · 0.68mm/px · 3 of 115 slices shown]
[im 39/115  soft-tissue]
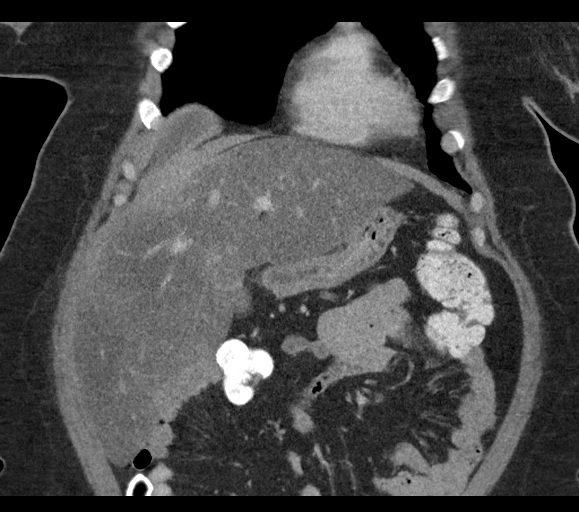
[im 51/115  soft-tissue]
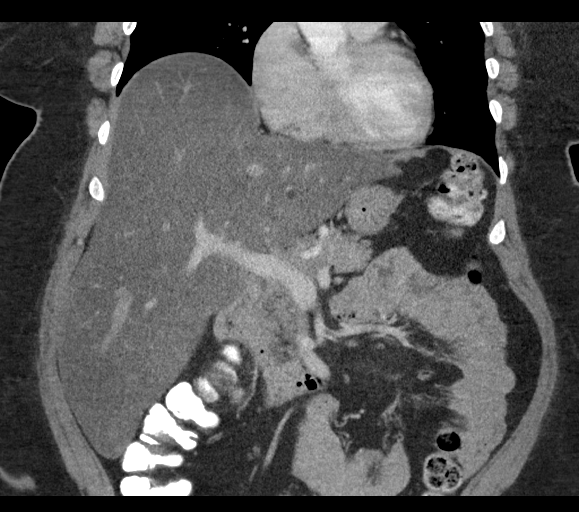
[im 64/115  soft-tissue]
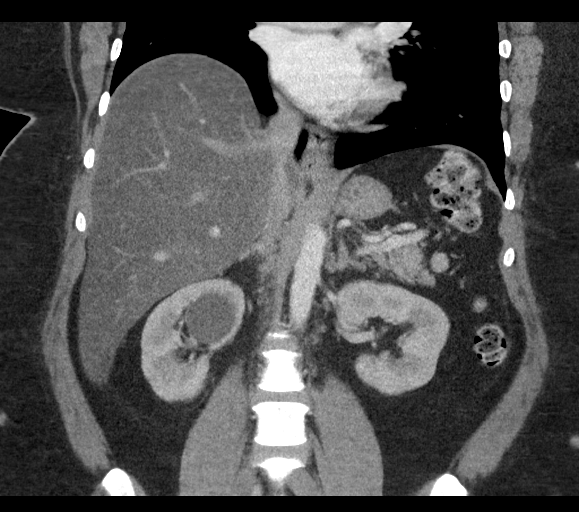

[Series 6: sagittal st · sagittal · 0.68mm/px · 1 of 125 slices shown]
[im 42/125  soft-tissue]
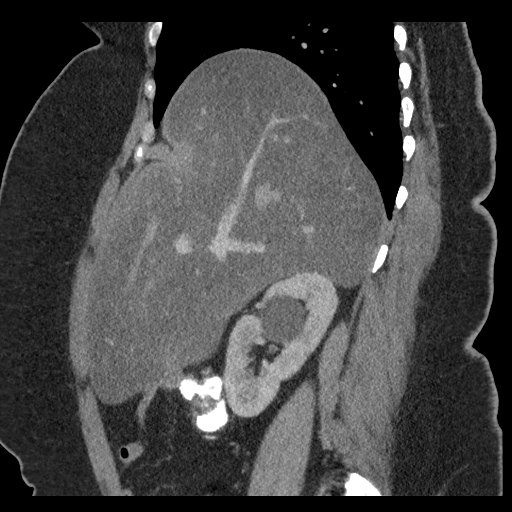

[13 of 46 positions shown; findings below may reference images not displayed]

FINDINGS: Lower chest: No acute findings.

Hepatobiliary: Severe diffuse hepatic steatosis is noted. Focal
areas of fatty sparing are seen centrally adjacent to the
gallbladder fossa and in the right anterior liver dome. A small
vascular shunt is incidentally noted in the inferior aspect of the
liver near the junction of the right and left lobes. No hepatic
masses are identified. Gallbladder is unremarkable. No evidence of
biliary ductal dilatation.

Pancreas:  No mass or inflammatory changes.

Spleen:  Within normal limits in size and appearance.

Adrenals/Urinary Tract: No masses identified. Small right renal cyst
noted. No evidence of hydronephrosis.

Stomach/Bowel: Visualized portion unremarkable.

Vascular/Lymphatic: No pathologically enlarged lymph nodes
identified. No abdominal aortic aneurysm.

Other:  None.

Musculoskeletal:  No suspicious bone lesions identified.
IMPRESSION: Severe hepatic steatosis with areas of focal fatty sparing. No
evidence of hepatic neoplasm or other acute findings.

## 2021-09-13 ENCOUNTER — Encounter: Payer: Self-pay | Admitting: Family Medicine

## 2021-11-01 LAB — HM DIABETES EYE EXAM

## 2021-12-11 ENCOUNTER — Other Ambulatory Visit: Payer: Self-pay | Admitting: Family Medicine

## 2021-12-11 DIAGNOSIS — E1169 Type 2 diabetes mellitus with other specified complication: Secondary | ICD-10-CM

## 2021-12-11 DIAGNOSIS — I10 Essential (primary) hypertension: Secondary | ICD-10-CM

## 2021-12-11 DIAGNOSIS — E559 Vitamin D deficiency, unspecified: Secondary | ICD-10-CM

## 2021-12-13 ENCOUNTER — Other Ambulatory Visit: Payer: Self-pay

## 2021-12-13 ENCOUNTER — Encounter: Payer: Self-pay | Admitting: Family Medicine

## 2021-12-13 ENCOUNTER — Ambulatory Visit: Payer: Self-pay | Admitting: *Deleted

## 2021-12-13 ENCOUNTER — Ambulatory Visit: Payer: No Typology Code available for payment source | Admitting: Family Medicine

## 2021-12-13 VITALS — BP 206/114 | HR 96 | Temp 97.9°F | Resp 16 | Ht 66.5 in | Wt 285.6 lb

## 2021-12-13 DIAGNOSIS — E1169 Type 2 diabetes mellitus with other specified complication: Secondary | ICD-10-CM | POA: Diagnosis not present

## 2021-12-13 DIAGNOSIS — E669 Obesity, unspecified: Secondary | ICD-10-CM

## 2021-12-13 DIAGNOSIS — I160 Hypertensive urgency: Secondary | ICD-10-CM

## 2021-12-13 DIAGNOSIS — G43009 Migraine without aura, not intractable, without status migrainosus: Secondary | ICD-10-CM

## 2021-12-13 DIAGNOSIS — E785 Hyperlipidemia, unspecified: Secondary | ICD-10-CM

## 2021-12-13 DIAGNOSIS — I10 Essential (primary) hypertension: Secondary | ICD-10-CM

## 2021-12-13 DIAGNOSIS — E063 Autoimmune thyroiditis: Secondary | ICD-10-CM

## 2021-12-13 MED ORDER — DEXAMETHASONE SODIUM PHOSPHATE 10 MG/ML IJ SOLN
10.0000 mg | Freq: Once | INTRAMUSCULAR | Status: AC
  Administered 2021-12-13: 10 mg via INTRAMUSCULAR

## 2021-12-13 MED ORDER — DAPAGLIFLOZIN PROPANEDIOL 10 MG PO TABS: 10.0000 mg | ORAL_TABLET | Freq: Every day | ORAL | 1 refills | Status: DC

## 2021-12-13 MED ORDER — NADOLOL 80 MG PO TABS: 80.0000 mg | ORAL_TABLET | Freq: Every day | ORAL | 0 refills | Status: DC

## 2021-12-13 MED ORDER — PROMETHAZINE HCL 25 MG/ML IJ SOLN
25.0000 mg | Freq: Once | INTRAMUSCULAR | Status: AC
  Administered 2021-12-13: 25 mg via INTRAMUSCULAR

## 2021-12-13 MED ORDER — OLMESARTAN-AMLODIPINE-HCTZ 40-10-25 MG PO TABS: 1.0000 | ORAL_TABLET | Freq: Every day | ORAL | 0 refills | Status: DC

## 2021-12-13 MED ORDER — ONDANSETRON HCL 4 MG PO TABS: 4.0000 mg | ORAL_TABLET | Freq: Three times a day (TID) | ORAL | 0 refills | Status: DC | PRN

## 2021-12-13 MED ORDER — NURTEC 75 MG PO TBDP: 1.0000 | ORAL_TABLET | ORAL | 0 refills | Status: DC

## 2021-12-13 NOTE — Patient Instructions (Signed)
Go to the ER with severe or worsening headache  ? ? ? ? ?Migraine Headache ?A migraine headache is a very strong throbbing pain on one side or both sides of your head. This type of headache can also cause other symptoms. It can last from 4 hours to 3 days. Talk with your doctor about what things may bring on (trigger) this condition. ?What are the causes? ?The exact cause of this condition is not known. This condition may be triggered or caused by: ?Drinking alcohol. ?Smoking. ?Taking medicines, such as: ?Medicine used to treat chest pain (nitroglycerin). ?Birth control pills. ?Estrogen. ?Some blood pressure medicines. ?Eating or drinking certain products. ?Doing physical activity. ?Other things that may trigger a migraine headache include: ?Having a menstrual period. ?Pregnancy. ?Hunger. ?Stress. ?Not getting enough sleep or getting too much sleep. ?Weather changes. ?Tiredness (fatigue). ?What increases the risk? ?Being 54-39 years old. ?Being female. ?Having a family history of migraine headaches. ?Being Caucasian. ?Having depression or anxiety. ?Being very overweight. ?What are the signs or symptoms? ?A throbbing pain. This pain may: ?Happen in any area of the head, such as on one side or both sides. ?Make it hard to do daily activities. ?Get worse with physical activity. ?Get worse around bright lights or loud noises. ?Other symptoms may include: ?Feeling sick to your stomach (nauseous). ?Vomiting. ?Dizziness. ?Being sensitive to bright lights, loud noises, or smells. ?Before you get a migraine headache, you may get warning signs (an aura). An aura may include: ?Seeing flashing lights or having blind spots. ?Seeing bright spots, halos, or zigzag lines. ?Having tunnel vision or blurred vision. ?Having numbness or a tingling feeling. ?Having trouble talking. ?Having weak muscles. ?Some people have symptoms after a migraine headache (postdromal phase), such as: ?Tiredness. ?Trouble thinking (concentrating). ?How is  this treated? ?Taking medicines that: ?Relieve pain. ?Relieve the feeling of being sick to your stomach. ?Prevent migraine headaches. ?Treatment may also include: ?Having acupuncture. ?Avoiding foods that bring on migraine headaches. ?Learning ways to control your body functions (biofeedback). ?Therapy to help you know and deal with negative thoughts (cognitive behavioral therapy). ?Follow these instructions at home: ?Medicines ?Take over-the-counter and prescription medicines only as told by your doctor. ?Ask your doctor if the medicine prescribed to you: ?Requires you to avoid driving or using heavy machinery. ?Can cause trouble pooping (constipation). You may need to take these steps to prevent or treat trouble pooping: ?Drink enough fluid to keep your pee (urine) pale yellow. ?Take over-the-counter or prescription medicines. ?Eat foods that are high in fiber. These include beans, whole grains, and fresh fruits and vegetables. ?Limit foods that are high in fat and sugar. These include fried or sweet foods. ?Lifestyle ?Do not drink alcohol. ?Do not use any products that contain nicotine or tobacco, such as cigarettes, e-cigarettes, and chewing tobacco. If you need help quitting, ask your doctor. ?Get at least 8 hours of sleep every night. ?Limit and deal with stress. ?General instructions ?  ?Keep a journal to find out what may bring on your migraine headaches. For example, write down: ?What you eat and drink. ?How much sleep you get. ?Any change in what you eat or drink. ?Any change in your medicines. ?If you have a migraine headache: ?Avoid things that make your symptoms worse, such as bright lights. ?It may help to lie down in a dark, quiet room. ?Do not drive or use heavy machinery. ?Ask your doctor what activities are safe for you. ?Keep all follow-up visits as told by your  doctor. This is important. ?Contact a doctor if: ?You get a migraine headache that is different or worse than others you have had. ?You  have more than 15 headache days in one month. ?Get help right away if: ?Your migraine headache gets very bad. ?Your migraine headache lasts longer than 72 hours. ?You have a fever. ?You have a stiff neck. ?You have trouble seeing. ?Your muscles feel weak or like you cannot control them. ?You start to lose your balance a lot. ?You start to have trouble walking. ?You pass out (faint). ?You have a seizure. ?Summary ?A migraine headache is a very strong throbbing pain on one side or both sides of your head. These headaches can also cause other symptoms. ?This condition may be treated with medicines and changes to your lifestyle. ?Keep a journal to find out what may bring on your migraine headaches. ?Contact a doctor if you get a migraine headache that is different or worse than others you have had. ?Contact your doctor if you have more than 15 headache days in a month. ?This information is not intended to replace advice given to you by your health care provider. Make sure you discuss any questions you have with your health care provider. ?Document Revised: 12/28/2018 Document Reviewed: 10/18/2018 ?Elsevier Patient Education ? [place]. ? ?

## 2021-12-13 NOTE — Telephone Encounter (Signed)
Reason for Disposition ? [1] SEVERE headache (e.g., excruciating) AND [2] not improved after 2 hours of pain medicine ? ?Answer Assessment - Initial Assessment Questions ?1. LOCATION: "Where does it hurt?"  ?    Across forehead to left side of face ?2. ONSET: "When did the headache start?" (Minutes, hours or days)  ?    On/off 2 weeks ?3. PATTERN: "Does the pain come and go, or has it been constant since it started?" ?    Comes and goes ?4. SEVERITY: "How bad is the pain?" and "What does it keep you from doing?"  (e.g., Scale 1-10; mild, moderate, or severe) ?  - MILD (1-3): doesn't interfere with normal activities  ?  - MODERATE (4-7): interferes with normal activities or awakens from sleep  ?  - SEVERE (8-10): excruciating pain, unable to do any normal activities    ?    severe ?5. RECURRENT SYMPTOM: "Have you ever had headaches before?" If Yes, ask: "When was the last time?" and "What happened that time?"  ?    Migraine diagnosis ?6. CAUSE: "What do you think is causing the headache?" ?    migraine ?7. MIGRAINE: "Have you been diagnosed with migraine headaches?" If Yes, ask: "Is this headache similar?"  ?    Yes- no medication on hand ?8. HEAD INJURY: "Has there been any recent injury to the head?"  ?    no ?9. OTHER SYMPTOMS: "Do you have any other symptoms?" (fever, stiff neck, eye pain, sore throat, cold symptoms) ?    no ?10. PREGNANCY: "Is there any chance you are pregnant?" "When was your last menstrual period?" ?      *No Answer* ? ?Protocols used: Headache-A-AH ? ?

## 2021-12-13 NOTE — Telephone Encounter (Signed)
?  Chief Complaint: severe headache ?Symptoms: headache- comes and goes, nausea/vomiting ?Frequency: 2 weeks- off/on ?Pertinent Negatives: Patient denies fever, stiff neck, eye pain, sore throat, cold symptoms ?Disposition: '[]'$ ED /'[]'$ Urgent Care (no appt availability in office) / '[x]'$ Appointment(In office/virtual)/ '[]'$  [facility]/ '[]'$ Home Care/ '[]'$ Refused Recommended Disposition /'[]'$ [facility]/ '[]'$  Follow-up with PCP ?Additional Notes:    ?

## 2021-12-13 NOTE — Progress Notes (Signed)
? ? ?Patient ID: Tiffany Nunez, female    DOB: 1971-07-22, 51 y.o.   MRN: [id number] ? ?PCP: Steele Sizer, MD ? ?Chief Complaint  ?Patient presents with  ? Migraine  ?  Onset for x2 weeks come/go along with vomiting. ? ?Pt states has been off of all her medications due to no insurance since June or July of 2022.  ? ? ?Subjective:  ? ?Tiffany Nunez is a 51 y.o. female, presents to clinic with CC of the following: ? ?HPI  ? ?Here with HA's and presents with severely elevated BP  ?Migraines have actually been a little better in the past 6 months ?HA started about two weeks ago ?Across forehead and worse in left eye - 9.5/10 ?Some photophobia/phonaphobia - not bad today - similar to past migraines, not her worst, some associated N/V and visual disturbances  ?Tried OTC meds advil yesterday helped a little bit ?She was seeing neurology/HA specialists - cannot use triptans - allergic ?Reviewed last 2 neuro appts and meds ?She was recently having less HA's ?She doesn't know what her BP typically runs off meds, previously well controlled ?She reports hx of stroke ?  ?BP Readings from Last 6 Encounters:  ?12/13/21 (!) 212/108  ?03/18/21 126/84  ?01/04/21 123/81  ?12/10/20 132/80  ?09/07/20 126/74  ?07/01/20 128/78  ? ?Out of all meds  ? ? ? ?Patient Active Problem List  ? Diagnosis Date Noted  ? Hypertension associated with type 2 diabetes mellitus ([place]) 03/18/2021  ? Mild episode of recurrent major depressive disorder ([place]) 05/28/2018  ? History of cerebrovascular accident (CVA) involving cerebellum 06/12/2017  ? Acquired autoimmune hypothyroidism 11/09/2016  ? Vitamin D deficiency 05/03/2015  ? Dyslipidemia 05/03/2015  ? Gastro-esophageal reflux disease without esophagitis 05/03/2015  ? Alopecia 05/03/2015  ? Genital herpes 05/03/2015  ? Dysmetabolic syndrome 59/56/3875  ? Positive H. pylori test 05/03/2015  ? Neuralgia neuritis, sciatic nerve 05/03/2015  ? Migraine without aura and responsive to treatment 04/30/2014   ? HTN (hypertension) 01/22/2014  ? Morbid obesity ([place]) 01/22/2014  ? Diabetes mellitus type 2 in obese ([place]) 01/22/2014  ? Decreased cardiac ejection fraction 01/22/2014  ? ? ? ? ?Current Outpatient Medications:  ?  ibuprofen (ADVIL) 800 MG tablet, TAKE 1 TABLET BY MOUTH EVERY DAY AS NEEDED, Disp: 90 tablet, Rfl: 0 ?  ACCU-CHEK GUIDE test strip, USE AS DIRECTED TO TEST FASTING BLOOD SUGAR TWICE A DAY (Patient not taking: Reported on 12/13/2021), Disp: 100 strip, Rfl: 2 ?  Accu-Chek Softclix Lancets lancets, SMARTSIG:Topical (Patient not taking: Reported on 12/13/2021), Disp: , Rfl:  ?  atorvastatin (LIPITOR) 40 MG tablet, Take 1 tablet (40 mg total) by mouth every other day. (Patient not taking: Reported on 12/13/2021), Disp: 45 tablet, Rfl: 1 ?  blood glucose meter kit and supplies, Dispense based on patient and insurance preference. FSBS twice daily  (FOR ICD-10 E10.9, E11.9). (Patient not taking: Reported on 12/13/2021), Disp: 1 each, Rfl: 0 ?  Celecoxib (ELYXYB) 120 MG/4.8ML SOLN, Take 120 mg by mouth daily as needed (Maximum 1 in 24 hours). (Patient not taking: Reported on 12/13/2021), Disp: 28.8 mL, Rfl: 5 ?  dapagliflozin propanediol (FARXIGA) 10 MG TABS tablet, Take 1 tablet (10 mg total) by mouth daily. (Patient not taking: Reported on 12/13/2021), Disp: 90 tablet, Rfl: 1 ?  levothyroxine (SYNTHROID) 25 MCG tablet, TAKE 1 TABLET BY MOUTH EVERY DAY BEFORE BREAKFAST (Patient not taking: Reported on 12/13/2021), Disp: 90 tablet, Rfl: 2 ?  linaclotide (LINZESS) 145 MCG  CAPS capsule, Take 1 capsule (145 mcg total) by mouth daily before breakfast. (Patient not taking: Reported on 12/13/2021), Disp: 90 capsule, Rfl: 0 ?  nadolol (CORGARD) 80 MG tablet, Take 1 tablet (80 mg total) by mouth daily. (Patient not taking: Reported on 12/13/2021), Disp: 90 tablet, Rfl: 0 ?  nortriptyline (PAMELOR) 50 MG capsule, TAKE 2 CAPSULES (100 MG TOTAL) BY MOUTH AT BEDTIME. (Patient not taking: Reported on 12/13/2021), Disp: 180 capsule,  Rfl: 0 ?  NOVOFINE AUTOCOVER PEN NEEDLE 30G X 8 MM MISC, INJECT 1 EACH INTO THE SKIN AS NEEDED. AS DIRECTED (Patient not taking: Reported on 12/13/2021), Disp: 100 each, Rfl: 1 ?  Olmesartan-amLODIPine-HCTZ 40-10-25 MG TABS, Take 1 tablet by mouth daily. (Patient not taking: Reported on 12/13/2021), Disp: 90 tablet, Rfl: 1 ?  ondansetron (ZOFRAN ODT) 4 MG disintegrating tablet, Take 1 tablet (4 mg total) by mouth every 8 (eight) hours as needed for nausea or vomiting. (Patient not taking: Reported on 12/13/2021), Disp: 20 tablet, Rfl: 5 ?  Semaglutide, 2 MG/DOSE, 8 MG/3ML SOPN, Inject 2 mg into the skin once a week. (Patient not taking: Reported on 12/13/2021), Disp: 9 mL, Rfl: 1 ?  TRESIBA FLEXTOUCH 100 UNIT/ML FlexTouch Pen, INJECT 30-50 UNITS UNDER THE SKIN DAILY (Patient not taking: Reported on 12/13/2021), Disp: 15 mL, Rfl: 0 ?  Vitamin D, Ergocalciferol, (DRISDOL) 1.25 MG (50000 UNIT) CAPS capsule, Take 1 capsule (50,000 Units total) by mouth every 7 (seven) days. (Patient not taking: Reported on 12/13/2021), Disp: 12 capsule, Rfl: 1 ? ? ?Allergies  ?Allergen Reactions  ? Imitrex [Sumatriptan] Anaphylaxis  ? Topamax [Topiramate] Other (See Comments)  ?  Caused hand numbness   ? Ubrogepant Other (See Comments)  ?  [name] made her feel groggy the following day and bad taste in her month   ? ? ? ?Social History  ? ?Tobacco Use  ? Smoking status: Never  ? Smokeless tobacco: Never  ?Vaping Use  ? Vaping Use: Never used  ?Substance Use Topics  ? Alcohol use: No  ?  Alcohol/week: 0.0 standard drinks  ? Drug use: No  ?  ? ? ?Chart Review Today: ?I personally reviewed active problem list, medication list, allergies, family history, social history, health maintenance, notes from last encounter, lab results, imaging with the patient/caregiver today. ? ? ?Review of Systems  ?Constitutional: Negative.   ?HENT: Negative.    ?Eyes: Negative.   ?Respiratory: Negative.  Negative for cough, chest tightness, shortness of breath and  wheezing.   ?Cardiovascular: Negative.  Negative for chest pain and palpitations.  ?Gastrointestinal: Negative.  Negative for abdominal pain.  ?Endocrine: Negative.   ?Genitourinary: Negative.   ?Musculoskeletal: Negative.   ?Skin: Negative.   ?Allergic/Immunologic: Negative.   ?Neurological:  Positive for headaches. Negative for dizziness, tremors, seizures, syncope, facial asymmetry, speech difficulty, weakness, light-headedness and numbness.  ?Hematological: Negative.   ?Psychiatric/Behavioral: Negative.    ?All other systems reviewed and are negative. ? ?   ?Objective:  ? ?Vitals:  ? 12/13/21 1455 12/13/21 1507 12/13/21 1621 12/13/21 1705  ?BP: (!) 232/110 (!) 212/108 (!) 210/116 (!) 206/114  ?Pulse: 96     ?Resp: 16     ?Temp: 97.9 ?F (36.6 ?C)     ?TempSrc: Oral     ?SpO2: 98%     ?Weight: 285 lb 9.6 oz (129.5 kg)     ?Height: 5' 6.5" (1.689 m)     ?  ?Body mass index is 45.41 kg/m?. ? ?Physical Exam ?Vitals and nursing  note reviewed.  ?Constitutional:   ?   General: She is not in acute distress. ?   Appearance: Normal appearance. She is well-developed. She is obese. She is not ill-appearing, toxic-appearing or diaphoretic.  ?   Interventions: Face mask in place.  ?HENT:  ?   Head: Normocephalic and atraumatic.  ?   Right Ear: Tympanic membrane, ear canal and external ear normal. There is no impacted cerumen.  ?   Left Ear: Tympanic membrane, ear canal and external ear normal. There is no impacted cerumen.  ?   Nose: Congestion present. No rhinorrhea.  ?   Mouth/Throat:  ?   Mouth: Mucous membranes are moist.  ?   Pharynx: Oropharynx is clear. No oropharyngeal exudate or posterior oropharyngeal erythema.  ?Eyes:  ?   General: Lids are normal. No scleral icterus.    ?   Right eye: No discharge.     ?   Left eye: No discharge.  ?   Conjunctiva/sclera: Conjunctivae normal.  ?   Pupils: Pupils are equal, round, and reactive to light.  ?Neck:  ?   Trachea: Phonation normal. No tracheal deviation.  ?Cardiovascular:   ?   Rate and Rhythm: Normal rate and regular rhythm.  ?   Pulses: Normal pulses.     ?     Radial pulses are 2+ on the right side and 2+ on the left side.  ?     Posterior tibial pulses are 2+ on the ri

## 2021-12-27 ENCOUNTER — Ambulatory Visit: Payer: Self-pay | Admitting: Nurse Practitioner

## 2022-01-03 ENCOUNTER — Ambulatory Visit: Payer: Self-pay | Admitting: Family Medicine

## 2022-01-03 DIAGNOSIS — E1169 Type 2 diabetes mellitus with other specified complication: Secondary | ICD-10-CM

## 2022-01-03 DIAGNOSIS — G43009 Migraine without aura, not intractable, without status migrainosus: Secondary | ICD-10-CM

## 2022-01-03 DIAGNOSIS — I10 Essential (primary) hypertension: Secondary | ICD-10-CM

## 2022-01-03 DIAGNOSIS — E785 Hyperlipidemia, unspecified: Secondary | ICD-10-CM

## 2022-01-18 NOTE — Progress Notes (Deleted)
NEUROLOGY FOLLOW UP OFFICE NOTE  TKAI LARGE [id number]  Assessment/Plan:   Migraine without aura, without status migrainosus, not intractable   Migraine prevention:  nortriptyline 119m at bedtime Migraine rescue:  Elyxyb Limit use of pain relievers to no more than 2 days out of week to prevent risk of rebound or medication-overuse headache. Keep headache diary Follow up 6 months.    Subjective:  Tiffany Lanahanis a 51year old right-handed female with diabetes, HTN, metabolic syndrome who follows up for migraines.   UPDATE: Intensity:  moderate Duration:  within 30 minutes or so with Elyxyb Frequency:  Usually less than one a month.  Over the past 4 weeks, she had about 4 due to the weather. Current NSAIDS/analgesics/migraine abortives:  Elyxyb Current triptans:  none Current ergotamine:  none Current anti-emetic:  Zofran ODT 427mCurrent muscle relaxants:  none Current anti-anxiolytic:  none Current sleep aide:  none Current Antihypertensive medications:  Nadolol, olmesartan-amlodipine-HCTZ, hydralazine  Current Antidepressant medications:  Nortriptyline 10043mt bedtime Current Anticonvulsant medications:  none Current anti-CGRP: none Current Vitamins/Herbal/Supplements:  none Current Antihistamines/Decongestants:  none Hormone/birth control:  none   Caffeine:  No coffee.  Stopped soda once diagnosed with diabetes.   Diet:  Improved water intake. Modified diet for diabetes.   Exercise:  Trying to start. Depression:  some; Anxiety:  yes.  Daughter recently in car accident.  Improving. Other pain:  no Sleep hygiene:  Poor.  Trouble staying asleep and falling asleep   HISTORY:  She has had migraines since her 20s56st worse over past year.  They are severe throbbing left or right frontal headache.  They may be preceded by zigzag lines in vision (sometimes with headache).  They are associated with nausea, photophobia, phonophobia, osmophobia, sometimes vomiting.   No numbness or weakness.  It may last 4 days where she is stuck in the bed, occurring at least once a month.  Others may last a day, occurring twice a week.  Triggers may include any smell (cologne, perfume) or change in barometric pressure.  No relieving factors.     She was diagnosed with diabetes in September.  She presented to the ED on 06/15/2020 for headache and increased blurred vision.  She missed her appointment with the ophthalmologist.  CT head without contrast personally reviewed was unremarkable.  She was treated with a headache cocktail of Toradol/Compazine/Benadryl.  Since treatment of diabetes, eye sight has improved.   MRI of brain without contrast from 05/14/2017 to evaluate headache following head trauma showed small chronic right cerebellar infarct but otherwise unremarkable.     Past NSAIDS/analgesics/migraine abortives:  Fioricet, ibuprofen, Mobic, Excedrin, Tylenol, Stadol, [place] Past abortive triptans:  Sumatriptan (anaphylaxis) Past abortive ergotamine:  none Past muscle relaxants:  none Past anti-emetic:  promethazine Past antihypertensive medications:  HCTZ, losartan Past antidepressant medications:  Cymbalta Past anticonvulsant medications:  topiramate (paresthesias) Past anti-CGRP:  Aimovig 61m52mbrelvy, Nurtec (rescue) Past vitamins/Herbal/Supplements:  none Past antihistamines/decongestants:  none Other past therapies:  none     Family history of headache:  No  PAST MEDICAL HISTORY: Past Medical History:  Diagnosis Date   Anemia    Depressive disorder    Diabetes mellitus, type II (HCC)    Esophageal reflux    Essential hypertension, benign    Genital herpes, unspecified    Helicobacter pylori (H. pylori)    Metabolic syndrome    Migraine, unspecified, without mention of intractable migraine without mention of status migrainosus    Nonspecific  abnormal electrocardiogram (ECG) (EKG)    Nonspecific abnormal results of thyroid function study     Obesity, unspecified    Other and unspecified hyperlipidemia    Unspecified vitamin D deficiency     MEDICATIONS: Current Outpatient Medications on File Prior to Visit  Medication Sig Dispense Refill   ACCU-CHEK GUIDE test strip USE AS DIRECTED TO TEST FASTING BLOOD SUGAR TWICE A DAY (Patient not taking: Reported on 12/13/2021) 100 strip 2   Accu-Chek Softclix Lancets lancets SMARTSIG:Topical (Patient not taking: Reported on 12/13/2021)     atorvastatin (LIPITOR) 40 MG tablet Take 1 tablet (40 mg total) by mouth every other day. (Patient not taking: Reported on 12/13/2021) 45 tablet 1   blood glucose meter kit and supplies Dispense based on patient and insurance preference. FSBS twice daily  (FOR ICD-10 E10.9, E11.9). (Patient not taking: Reported on 12/13/2021) 1 each 0   Celecoxib (ELYXYB) 120 MG/4.8ML SOLN Take 120 mg by mouth daily as needed (Maximum 1 in 24 hours). (Patient not taking: Reported on 12/13/2021) 28.8 mL 5   dapagliflozin propanediol (FARXIGA) 10 MG TABS tablet Take 1 tablet (10 mg total) by mouth daily. 90 tablet 1   levothyroxine (SYNTHROID) 25 MCG tablet TAKE 1 TABLET BY MOUTH EVERY DAY BEFORE BREAKFAST (Patient not taking: Reported on 12/13/2021) 90 tablet 2   linaclotide (LINZESS) 145 MCG CAPS capsule Take 1 capsule (145 mcg total) by mouth daily before breakfast. (Patient not taking: Reported on 12/13/2021) 90 capsule 0   nadolol (CORGARD) 80 MG tablet Take 1 tablet (80 mg total) by mouth daily. 90 tablet 0   nortriptyline (PAMELOR) 50 MG capsule TAKE 2 CAPSULES (100 MG TOTAL) BY MOUTH AT BEDTIME. (Patient not taking: Reported on 12/13/2021) 180 capsule 0   NOVOFINE AUTOCOVER PEN NEEDLE 30G X 8 MM MISC INJECT 1 EACH INTO THE SKIN AS NEEDED. AS DIRECTED (Patient not taking: Reported on 12/13/2021) 100 each 1   Olmesartan-amLODIPine-HCTZ 40-10-25 MG TABS Take 1 tablet by mouth daily. 90 tablet 0   ondansetron (ZOFRAN ODT) 4 MG disintegrating tablet Take 1 tablet (4 mg total) by mouth  every 8 (eight) hours as needed for nausea or vomiting. (Patient not taking: Reported on 12/13/2021) 20 tablet 5   ondansetron (ZOFRAN) 4 MG tablet Take 1 tablet (4 mg total) by mouth every 8 (eight) hours as needed for nausea or vomiting. 20 tablet 0   Rimegepant Sulfate (NURTEC) 75 MG TBDP Take 1 tablet by mouth every other day. 8 tablet 0   Semaglutide, 2 MG/DOSE, 8 MG/3ML SOPN Inject 2 mg into the skin once a week. (Patient not taking: Reported on 12/13/2021) 9 mL 1   TRESIBA FLEXTOUCH 100 UNIT/ML FlexTouch Pen INJECT 30-50 UNITS UNDER THE SKIN DAILY (Patient not taking: Reported on 12/13/2021) 15 mL 0   Vitamin D, Ergocalciferol, (DRISDOL) 1.25 MG (50000 UNIT) CAPS capsule Take 1 capsule (50,000 Units total) by mouth every 7 (seven) days. (Patient not taking: Reported on 12/13/2021) 12 capsule 1   No current facility-administered medications on file prior to visit.    ALLERGIES: Allergies  Allergen Reactions   Imitrex [Sumatriptan] Anaphylaxis   Topamax [Topiramate] Other (See Comments)    Caused hand numbness    Ubrogepant Other (See Comments)    Ubrelvy made her feel groggy the following day and bad taste in her month     FAMILY HISTORY: Family History  Problem Relation Age of Onset   Hypertension Father    Hyperlipidemia Father    Hyperlipidemia  Mother    Hypertension Mother    Hypertension Paternal Aunt    Cancer Maternal Grandmother        breast      Objective:  *** General: No acute distress.  Patient appears ***-groomed.   Head:  Normocephalic/atraumatic Eyes:  Fundi examined but not visualized Neck: supple, no paraspinal tenderness, full range of motion Heart:  Regular rate and rhythm Lungs:  Clear to auscultation bilaterally Back: No paraspinal tenderness Neurological Exam: alert and oriented to person, place, and time.  Speech fluent and not dysarthric, language intact.  CN II-XII intact. Bulk and tone normal, muscle strength 5/5 throughout.  Sensation to light  touch intact.  Deep tendon reflexes 2+ throughout, toes downgoing.  Finger to nose testing intact.  Gait normal, Romberg negative.   Metta Clines, DO  CC: ***

## 2022-01-20 ENCOUNTER — Ambulatory Visit: Payer: Self-pay | Admitting: Neurology

## 2023-01-31 ENCOUNTER — Encounter: Payer: Self-pay | Admitting: Emergency Medicine

## 2023-01-31 ENCOUNTER — Emergency Department
Admission: EM | Admit: 2023-01-31 | Discharge: 2023-01-31 | Disposition: A | Discharge status: Home / Self Care | Payer: Medicaid Other | Attending: Emergency Medicine | Admitting: Emergency Medicine

## 2023-01-31 ENCOUNTER — Other Ambulatory Visit: Payer: Self-pay

## 2023-01-31 ENCOUNTER — Emergency Department: Payer: Managed Care, Other (non HMO)

## 2023-01-31 DIAGNOSIS — E119 Type 2 diabetes mellitus without complications: Secondary | ICD-10-CM | POA: Insufficient documentation

## 2023-01-31 DIAGNOSIS — I10 Essential (primary) hypertension: Secondary | ICD-10-CM | POA: Insufficient documentation

## 2023-01-31 DIAGNOSIS — Z79899 Other long term (current) drug therapy: Secondary | ICD-10-CM | POA: Insufficient documentation

## 2023-01-31 LAB — CBC
HCT: 45 % (ref 36.0–46.0)
Hemoglobin: 14.8 g/dL (ref 12.0–15.0)
MCH: 29 pg (ref 26.0–34.0)
MCHC: 32.9 g/dL (ref 30.0–36.0)
MCV: 88.1 fL (ref 80.0–100.0)
Platelets: 356 10*3/uL (ref 150–400)
RBC: 5.11 MIL/uL (ref 3.87–5.11)
RDW: 13.3 % (ref 11.5–15.5)
WBC: 6.1 10*3/uL (ref 4.0–10.5)
nRBC: 0 % (ref 0.0–0.2)

## 2023-01-31 LAB — BASIC METABOLIC PANEL
Anion gap: 10 (ref 5–15)
BUN: 14 mg/dL (ref 6–20)
CO2: 27 mmol/L (ref 22–32)
Calcium: 9.5 mg/dL (ref 8.9–10.3)
Chloride: 100 mmol/L (ref 98–111)
Creatinine, Ser: 0.92 mg/dL (ref 0.44–1.00)
GFR, Estimated: >60 mL/min (ref >60)
Glucose, Bld: 175 mg/dL — ABNORMAL HIGH (ref 70–99)
Potassium: 3.2 mmol/L — ABNORMAL LOW (ref 3.5–5.1)
Sodium: 137 mmol/L (ref 135–145)

## 2023-01-31 LAB — TROPONIN I (HIGH SENSITIVITY): Troponin I (High Sensitivity): 6 ng/L (ref <18)

## 2023-01-31 NOTE — ED Triage Notes (Addendum)
Pt here with hypertension. Pt states she has a hx of htn but has not been able to afford her medications in the past 2 years. Pt had a recent bp check that was 203/130. Pt states she is also having cp, left sided that radiates to her left arm. Pt states pain is constant and describes it as pressure. Pt alert and oriented.

## 2023-01-31 NOTE — ED Provider Notes (Signed)
[facility] Provider Note    Event Date/Time   First MD Initiated Contact with Patient 01/31/23 1150     (approximate)   History   High blood pressure   HPI  Tiffany Nunez is a 52 y.o. female with history of high blood pressure, diabetes who reports she has been out of her blood pressure medication for nearly 2 years, was recently started back on blood pressure medication by new PCP, has only taken 1 or 2 doses.  Blood pressure was found to be elevated today and she feels like blood pressure medication may not be working.  She denies headache or chest pain to me.     Physical Exam   Triage Vital Signs: ED Triage Vitals  Enc Vitals Group     BP 01/31/23 1136 (!) 190/125     Pulse Rate 01/31/23 1136 87     Resp 01/31/23 1136 18     Temp 01/31/23 1136 98.2 F (36.8 C)     Temp Source 01/31/23 1136 Oral     SpO2 01/31/23 1136 93 %     Weight 01/31/23 1137 129.5 kg (285 lb 7.9 oz)     Height 01/31/23 1137 1.689 m (5' 6.5")     Head Circumference --      Peak Flow --      Pain Score 01/31/23 1137 9     Pain Loc --      Pain Edu? --      Excl. in GC? --     Most recent vital signs: Vitals:   01/31/23 1136  BP: (!) 190/125  Pulse: 87  Resp: 18  Temp: 98.2 F (36.8 C)  SpO2: 93%     General: Awake, no distress.  CV:  Good peripheral perfusion.  Resp:  Normal effort.  Abd:  No distention.  Other:     ED Results / Procedures / Treatments   Labs (all labs ordered are listed, but only abnormal results are displayed) Labs Reviewed  BASIC METABOLIC PANEL - Abnormal; Notable for the following components:      Result Value   Potassium 3.2 (*)    Glucose, Bld 175 (*)    All other components within normal limits  CBC  TROPONIN I (HIGH SENSITIVITY)     EKG  ED ECG REPORT I, Jene Every, the attending physician, personally viewed and interpreted this ECG.  Date: 01/31/2023  Rhythm: normal sinus rhythm QRS Axis:  normal Intervals: normal ST/T Wave abnormalities: normal Narrative Interpretation: no evidence of acute ischemia    RADIOLOGY Chest x-ray viewed inter by me, no acute abnormality    PROCEDURES:  Critical Care performed:   Procedures   MEDICATIONS ORDERED IN ED: Medications - No data to display   IMPRESSION / MDM / ASSESSMENT AND PLAN / ED COURSE  I reviewed the triage vital signs and the nursing notes. Patient's presentation is most consistent with exacerbation of chronic illness.  Patient with long history of high blood pressure presents with elevated blood pressure.  She has only taken 1 or 2 doses of her blood pressure medication, I counseled the patient that it would take longer to achieve blood pressure control and likely require dose adjustments, medication changes as needed.  Encouraged her to keep taking her medication that was just recently prescribed.  Lab work today is reassuring, normal high-sensitivity troponin, chest x-ray EKG unremarkable.  Appropriate for discharge with outpatient close follow-up with PCP.  FINAL CLINICAL IMPRESSION(S) / ED DIAGNOSES   Final diagnoses:  Primary hypertension     Rx / DC Orders   ED Discharge Orders     None        Note:  This document was prepared using Dragon voice recognition software and may include unintentional dictation errors.   Jene Every, MD 01/31/23 1300

## 2023-02-02 LAB — HM DIABETES EYE EXAM

## 2023-02-07 ENCOUNTER — Encounter: Payer: Self-pay | Admitting: Physician Assistant

## 2023-02-07 ENCOUNTER — Ambulatory Visit (INDEPENDENT_AMBULATORY_CARE_PROVIDER_SITE_OTHER): Payer: Managed Care, Other (non HMO) | Admitting: Physician Assistant

## 2023-02-07 VITALS — BP 172/120 | HR 98 | Temp 98.1°F | Resp 18 | Ht 66.5 in | Wt 284.8 lb

## 2023-02-07 DIAGNOSIS — Z794 Long term (current) use of insulin: Secondary | ICD-10-CM

## 2023-02-07 DIAGNOSIS — E559 Vitamin D deficiency, unspecified: Secondary | ICD-10-CM | POA: Diagnosis not present

## 2023-02-07 DIAGNOSIS — I10 Essential (primary) hypertension: Secondary | ICD-10-CM

## 2023-02-07 DIAGNOSIS — Z1329 Encounter for screening for other suspected endocrine disorder: Secondary | ICD-10-CM

## 2023-02-07 DIAGNOSIS — Z1211 Encounter for screening for malignant neoplasm of colon: Secondary | ICD-10-CM

## 2023-02-07 DIAGNOSIS — E1169 Type 2 diabetes mellitus with other specified complication: Secondary | ICD-10-CM

## 2023-02-07 DIAGNOSIS — Z7984 Long term (current) use of oral hypoglycemic drugs: Secondary | ICD-10-CM

## 2023-02-07 DIAGNOSIS — E785 Hyperlipidemia, unspecified: Secondary | ICD-10-CM

## 2023-02-07 DIAGNOSIS — E669 Obesity, unspecified: Secondary | ICD-10-CM

## 2023-02-07 DIAGNOSIS — Z1231 Encounter for screening mammogram for malignant neoplasm of breast: Secondary | ICD-10-CM

## 2023-02-07 DIAGNOSIS — E063 Autoimmune thyroiditis: Secondary | ICD-10-CM

## 2023-02-07 LAB — CBC WITH DIFFERENTIAL/PLATELET
Basophils Relative: 1 %
Lymphs Abs: 2030 cells/uL (ref 850–3900)
MCV: 87.1 fL (ref 80.0–100.0)
RDW: 13 % (ref 11.0–15.0)

## 2023-02-07 MED ORDER — OLMESARTAN-AMLODIPINE-HCTZ 40-5-12.5 MG PO TABS
1.0000 | ORAL_TABLET | Freq: Every day | ORAL | Status: DC
Start: 2023-02-07 — End: 2023-02-14

## 2023-02-07 NOTE — Assessment & Plan Note (Signed)
Chronic, historic condition, currently unmanaged per patient. She reports that she has not had any medications over the last 2 years and states that she has not been monitoring her blood sugars. She is interested in getting a continuous glucose monitor as she does not like to have to prick her fingers. Review of previous encounters demonstrates that she was previously on [place], Ozempic.  When asked about these medications she was not sure which ones she had taken and states that she did not like giving herself injections so I am unsure if she ever fully started [place] or Ozempic. Will order A1c today to assess where she is and tailor as indicated. Recommend follow-up with PCP in about 4 weeks to develop management plan with patient.

## 2023-02-07 NOTE — Assessment & Plan Note (Signed)
Chronic, historic condition. Patient reports that she has not had her medications for about 2 years.  There are no recent cholesterol panels that I am able to see so I will order a lipid panel today.  Results of this to dictate further management.  Will likely recommend to restart cholesterol medication given her previous medical history. Follow-up in about 4 weeks with her PCP to discuss conditions and medications

## 2023-02-07 NOTE — Assessment & Plan Note (Signed)
Chronic, historic condition, currently exacerbated. Patient reports that she has not had her medications for the past 2 years and was recently restarted on combination olmesartan-amlodipine-HCTZ 40-5-12.5 mg p.o. daily in the last week. She was seen in the emergency room on 01/31/2023 for elevated blood pressure.  I have reviewed the emergency room notes along with labs and imaging results. Her blood pressure is still elevated today but is improved from emergency room readings.  She states that she does not have a blood pressure cuff at home so she is unable to monitor at this time.  I recommend that she obtains a blood pressure cuff and checks daily and record the readings so we can trend her blood pressures. I reviewed with her that it can take several weeks for her blood pressure to respond to the medication that she has been restarted on.  I recommend that she returns in about 3 to 4 weeks to follow-up with her PCP and review her chronic conditions as well as get restarted on her medications

## 2023-02-07 NOTE — Assessment & Plan Note (Signed)
Chronic, historic condition per chart review. Patient did not know if she did have thyroid issues when asked if this is a condition that she is currently being treated for or had in the past. Will order thyroid panel with TSH to assess current status.  There is evidence of her being treated with levothyroxine in the past. Results of lab work to dictate further management. Follow-up with PCP in about 4 weeks to discuss results and management

## 2023-02-07 NOTE — Progress Notes (Signed)
Acute Office Visit   Patient: Tiffany Nunez   DOB: December 08, 1970   52 y.o. Female  MRN: [id number] Visit Date: 02/07/2023  Today's healthcare provider: Oswaldo Conroy Joyceann Kruser, PA-C  Introduced myself to the patient as a Secondary school teacher and provided education on APPs in clinical practice.    Chief Complaint  Patient presents with   Follow-up    ER for BP   Hypertension   Subjective    HPI HPI     Follow-up    Additional comments: ER for BP      Last edited by Marcos Eke, CMA on 02/07/2023  8:19 AM.      She reports she is concerned for elevated BP  She states her BP has probably been high for the past 2 years as she has not had her medications  She reports she is not checking her BP at home as she does not have a cuff She has been taking Olmesartan-Amlodipine-HCTZ 40-5-12.5 since last week (around Tuesday)  She denies side effects from starting the medication She reports some headaches but she has a hx of migraines She reports she was seen by her eye doctor a few weeks ago and there were retina changes found- she has follow up in 3 months with them.  She reports some intermittent chest pains- states it happens randomly and does not seem to be tied to exertion Reports chest pain resolves spontaneously    Medications: Outpatient Medications Prior to Visit  Medication Sig   ACCU-CHEK GUIDE test strip USE AS DIRECTED TO TEST FASTING BLOOD SUGAR TWICE A DAY (Patient not taking: Reported on 12/13/2021)   Accu-Chek Softclix Lancets lancets SMARTSIG:Topical (Patient not taking: Reported on 12/13/2021)   atorvastatin (LIPITOR) 40 MG tablet Take 1 tablet (40 mg total) by mouth every other day. (Patient not taking: Reported on 12/13/2021)   blood glucose meter kit and supplies Dispense based on patient and insurance preference. FSBS twice daily  (FOR ICD-10 E10.9, E11.9). (Patient not taking: Reported on 12/13/2021)   Celecoxib (ELYXYB) 120 MG/4.8ML SOLN Take 120 mg by mouth daily as needed  (Maximum 1 in 24 hours). (Patient not taking: Reported on 12/13/2021)   levothyroxine (SYNTHROID) 25 MCG tablet TAKE 1 TABLET BY MOUTH EVERY DAY BEFORE BREAKFAST (Patient not taking: Reported on 12/13/2021)   linaclotide (LINZESS) 145 MCG CAPS capsule Take 1 capsule (145 mcg total) by mouth daily before breakfast. (Patient not taking: Reported on 12/13/2021)   nortriptyline (PAMELOR) 50 MG capsule TAKE 2 CAPSULES (100 MG TOTAL) BY MOUTH AT BEDTIME. (Patient not taking: Reported on 12/13/2021)   NOVOFINE AUTOCOVER PEN NEEDLE 30G X 8 MM MISC INJECT 1 EACH INTO THE SKIN AS NEEDED. AS DIRECTED (Patient not taking: Reported on 12/13/2021)   ondansetron (ZOFRAN ODT) 4 MG disintegrating tablet Take 1 tablet (4 mg total) by mouth every 8 (eight) hours as needed for nausea or vomiting. (Patient not taking: Reported on 12/13/2021)   Semaglutide, 2 MG/DOSE, 8 MG/3ML SOPN Inject 2 mg into the skin once a week. (Patient not taking: Reported on 12/13/2021)   TRESIBA FLEXTOUCH 100 UNIT/ML FlexTouch Pen INJECT 30-50 UNITS UNDER THE SKIN DAILY (Patient not taking: Reported on 12/13/2021)   Vitamin D, Ergocalciferol, (DRISDOL) 1.25 MG (50000 UNIT) CAPS capsule Take 1 capsule (50,000 Units total) by mouth every 7 (seven) days. (Patient not taking: Reported on 12/13/2021)   [DISCONTINUED] dapagliflozin propanediol (FARXIGA) 10 MG TABS tablet Take 1 tablet (10 mg total) by  mouth daily.   [DISCONTINUED] nadolol (CORGARD) 80 MG tablet Take 1 tablet (80 mg total) by mouth daily.   [DISCONTINUED] Olmesartan-amLODIPine-HCTZ 40-10-25 MG TABS Take 1 tablet by mouth daily.   [DISCONTINUED] ondansetron (ZOFRAN) 4 MG tablet Take 1 tablet (4 mg total) by mouth every 8 (eight) hours as needed for nausea or vomiting.   [DISCONTINUED] Rimegepant Sulfate (NURTEC) 75 MG TBDP Take 1 tablet by mouth every other day.   No facility-administered medications prior to visit.    Review of Systems  Eyes:  Positive for visual disturbance.   Respiratory:  Negative for shortness of breath and wheezing.   Cardiovascular:  Positive for chest pain. Negative for leg swelling.  Neurological:  Positive for headaches.       Objective    BP (!) 172/120   Pulse 98   Temp 98.1 F (36.7 C)   Resp 18   Ht 5' 6.5" (1.689 m)   Wt 284 lb 12.8 oz (129.2 kg)   SpO2 98%   BMI 45.28 kg/m    Physical Exam Vitals reviewed.  Constitutional:      General: She is awake.     Appearance: Normal appearance. She is well-developed and well-groomed.  HENT:     Head: Normocephalic and atraumatic.  Cardiovascular:     Rate and Rhythm: Normal rate and regular rhythm.     Pulses: Normal pulses.          Radial pulses are 2+ on the right side and 2+ on the left side.     Heart sounds: Normal heart sounds. No murmur heard.    No friction rub. No gallop.  Pulmonary:     Effort: Pulmonary effort is normal.     Breath sounds: Normal breath sounds. No decreased air movement. No decreased breath sounds, wheezing, rhonchi or rales.  Musculoskeletal:     Right lower leg: No edema.     Left lower leg: No edema.  Neurological:     Mental Status: She is alert.  Psychiatric:        Attention and Perception: Attention normal.        Mood and Affect: Mood normal.        Speech: Speech normal.        Behavior: Behavior normal. Behavior is cooperative.       No results found for any visits on 02/07/23.  Assessment & Plan      Return in 4 weeks (on 03/07/2023) for f/u DM and HTN with PCP .      Problem List Items Addressed This Visit       Cardiovascular and Mediastinum   HTN (hypertension) - Primary    Chronic, historic condition, currently exacerbated. Patient reports that she has not had her medications for the past 2 years and was recently restarted on combination olmesartan-amlodipine-HCTZ 40-5-12.5 mg p.o. daily in the last week. She was seen in the emergency room on 01/31/2023 for elevated blood pressure.  I have reviewed the  emergency room notes along with labs and imaging results. Her blood pressure is still elevated today but is improved from emergency room readings.  She states that she does not have a blood pressure cuff at home so she is unable to monitor at this time.  I recommend that she obtains a blood pressure cuff and checks daily and record the readings so we can trend her blood pressures. I reviewed with her that it can take several weeks for her blood pressure to respond to  the medication that she has been restarted on.  I recommend that she returns in about 3 to 4 weeks to follow-up with her PCP and review her chronic conditions as well as get restarted on her medications      Relevant Medications   Olmesartan-amLODIPine-HCTZ 40-5-12.5 MG TABS   Other Relevant Orders   CBC w/Diff/Platelet   COMPLETE METABOLIC PANEL WITH GFR     Endocrine   Type 2 diabetes mellitus with obesity (HCC)    Chronic, historic condition, currently unmanaged per patient. She reports that she has not had any medications over the last 2 years and states that she has not been monitoring her blood sugars. She is interested in getting a continuous glucose monitor as she does not like to have to prick her fingers. Review of previous encounters demonstrates that she was previously on [place], Ozempic.  When asked about these medications she was not sure which ones she had taken and states that she did not like giving herself injections so I am unsure if she ever fully started [place] or Ozempic. Will order A1c today to assess where she is and tailor as indicated. Recommend follow-up with PCP in about 4 weeks to develop management plan with patient.      Relevant Medications   Olmesartan-amLODIPine-HCTZ 40-5-12.5 MG TABS   Other Relevant Orders   HgB A1c   B12   Acquired autoimmune hypothyroidism    Chronic, historic condition per chart review. Patient did not know if she did have thyroid issues when asked if this is  a condition that she is currently being treated for or had in the past. Will order thyroid panel with TSH to assess current status.  There is evidence of her being treated with levothyroxine in the past. Results of lab work to dictate further management. Follow-up with PCP in about 4 weeks to discuss results and management        Other   Vitamin D deficiency   Relevant Orders   Vitamin D (25 hydroxy)   Dyslipidemia    Chronic, historic condition. Patient reports that she has not had her medications for about 2 years.  There are no recent cholesterol panels that I am able to see so I will order a lipid panel today.  Results of this to dictate further management.  Will likely recommend to restart cholesterol medication given her previous medical history. Follow-up in about 4 weeks with her PCP to discuss conditions and medications      Relevant Orders   Lipid Profile   Other Visit Diagnoses     Encounter for screening mammogram for malignant neoplasm of breast       Relevant Orders   MM 3D SCREENING MAMMOGRAM BILATERAL BREAST   Screening for colon cancer       Relevant Orders   Ambulatory referral to Gastroenterology   Screening for thyroid disorder       Relevant Orders   Thyroid Panel With TSH        Return in 4 weeks (on 03/07/2023) for f/u DM and HTN with PCP .   I, Wojciech Willetts E Kyle Stansell, PA-C, have reviewed all documentation for this visit. The documentation on 02/07/23 for the exam, diagnosis, procedures, and orders are all accurate and complete.   Jacquelin Hawking, MHS, PA-C [facility]

## 2023-02-08 LAB — CBC WITH DIFFERENTIAL/PLATELET
Absolute Monocytes: 317 cells/uL (ref 200–950)
Basophils Absolute: 48 cells/uL (ref 0–200)
Eosinophils Absolute: 82 cells/uL (ref 15–500)
Eosinophils Relative: 1.7 %
HCT: 43.7 % (ref 35.0–45.0)
Hemoglobin: 14.7 g/dL (ref 11.7–15.5)
MCH: 29.3 pg (ref 27.0–33.0)
MCHC: 33.6 g/dL (ref 32.0–36.0)
MPV: 10.7 fL (ref 7.5–12.5)
Monocytes Relative: 6.6 %
Neutro Abs: 2323 cells/uL (ref 1500–7800)
Neutrophils Relative %: 48.4 %
Platelets: 362 10*3/uL (ref 140–400)
RBC: 5.02 10*6/uL (ref 3.80–5.10)
Total Lymphocyte: 42.3 %
WBC: 4.8 10*3/uL (ref 3.8–10.8)

## 2023-02-08 LAB — COMPLETE METABOLIC PANEL WITH GFR
AG Ratio: 1.3 (calc) (ref 1.0–2.5)
ALT: 54 U/L — ABNORMAL HIGH (ref 6–29)
AST: 38 U/L — ABNORMAL HIGH (ref 10–35)
Albumin: 4.2 g/dL (ref 3.6–5.1)
Alkaline phosphatase (APISO): 134 U/L (ref 37–153)
BUN: 15 mg/dL (ref 7–25)
CO2: 31 mmol/L (ref 20–32)
Calcium: 9.7 mg/dL (ref 8.6–10.4)
Chloride: 100 mmol/L (ref 98–110)
Creat: 0.96 mg/dL (ref 0.50–1.03)
Globulin: 3.2 g/dL (calc) (ref 1.9–3.7)
Glucose, Bld: 195 mg/dL — ABNORMAL HIGH (ref 65–99)
Potassium: 3.8 mmol/L (ref 3.5–5.3)
Sodium: 140 mmol/L (ref 135–146)
Total Bilirubin: 0.4 mg/dL (ref 0.2–1.2)
Total Protein: 7.4 g/dL (ref 6.1–8.1)
eGFR: 72 mL/min/{1.73_m2} (ref >=60)

## 2023-02-08 LAB — VITAMIN B12: Vitamin B-12: 734 pg/mL (ref 200–1100)

## 2023-02-08 LAB — HEMOGLOBIN A1C
Hgb A1c MFr Bld: 8.2 % of total Hgb — ABNORMAL HIGH (ref <5.7)
Mean Plasma Glucose: 189 mg/dL
eAG (mmol/L): 10.4 mmol/L

## 2023-02-08 LAB — LIPID PANEL
Cholesterol: 233 mg/dL — ABNORMAL HIGH (ref <200)
HDL: 45 mg/dL — ABNORMAL LOW (ref >=50)
LDL Cholesterol (Calc): 165 mg/dL (calc) — ABNORMAL HIGH
Non-HDL Cholesterol (Calc): 188 mg/dL (calc) — ABNORMAL HIGH (ref <130)
Total CHOL/HDL Ratio: 5.2 (calc) — ABNORMAL HIGH (ref <5.0)
Triglycerides: 110 mg/dL (ref <150)

## 2023-02-08 LAB — VITAMIN D 25 HYDROXY (VIT D DEFICIENCY, FRACTURES): Vit D, 25-Hydroxy: 18 ng/mL — ABNORMAL LOW (ref 30–100)

## 2023-02-08 LAB — THYROID PANEL WITH TSH
Free Thyroxine Index: 2 (ref 1.4–3.8)
T3 Uptake: 24 % (ref 22–35)
T4, Total: 8.5 ug/dL (ref 5.1–11.9)
TSH: 4.85 mIU/L — ABNORMAL HIGH

## 2023-02-08 MED ORDER — RYBELSUS 3 MG PO TABS
3.0000 mg | ORAL_TABLET | Freq: Every day | ORAL | 1 refills | Status: DC
Start: 2023-02-08 — End: 2023-03-07

## 2023-02-08 MED ORDER — ATORVASTATIN CALCIUM 20 MG PO TABS
20.0000 mg | ORAL_TABLET | Freq: Every day | ORAL | 3 refills | Status: AC
Start: 2023-02-08 — End: ?

## 2023-02-08 MED ORDER — VITAMIN D (ERGOCALCIFEROL) 1.25 MG (50000 UNIT) PO CAPS
50000.0000 [IU] | ORAL_CAPSULE | ORAL | 1 refills | Status: AC
Start: 2023-02-08 — End: ?

## 2023-02-08 MED ORDER — DAPAGLIFLOZIN PROPANEDIOL 5 MG PO TABS
5.0000 mg | ORAL_TABLET | Freq: Every day | ORAL | 1 refills | Status: DC
Start: 2023-02-08 — End: 2023-02-14

## 2023-02-08 NOTE — Progress Notes (Signed)
Your lab results have returned.  Your electrolytes with the exception of your glucose appear overall normal.  Your kidney function also appears normal.  2 of your liver enzymes are mildly elevated but I think at this point we can continue to monitor them and recheck these at your follow-up. Your A1c is 8.2.  This is over the goal of 7.0.  I recommend restarting some of your medications to assist with this.  I will be sending in a prescription for Farxiga and Rybelsus to help manage your diabetes. Your blood count was normal did not show signs of anemia. Your vitamin B12 was in normal range.  Your vitamin D was low.  At this time I recommend supplementing to improve your levels.  I have sent in a refill of your vitamin D 50,000 unit capsules.  Please take 1 of these every 7 days.  We will recheck your vitamin D levels in about 3 months to make sure that it is improving. Your cholesterol was elevated.  I will send in a refill of your Lipitor for you to restart to get this better controlled.  I also recommend diet and exercise to further assist with cholesterol management along with diabetes and blood pressure control. Your TSH (one of the hormones that we used to monitor your thyroid function) was mildly elevated but your thyroid hormones are in normal range.  At this time I do not recommend restarting thyroid hormone but we should keep a close eye on this.  I recommend rechecking it in about 3 months. Please make sure to schedule your follow-up In about 4 weeks if you have not already done so.

## 2023-02-08 NOTE — Addendum Note (Signed)
Addended by: Jacquelin Hawking on: 02/08/2023 10:11 AM   Modules accepted: Orders

## 2023-02-14 ENCOUNTER — Ambulatory Visit: Payer: Self-pay | Admitting: *Deleted

## 2023-02-14 ENCOUNTER — Telehealth: Payer: Medicaid Other

## 2023-02-14 ENCOUNTER — Ambulatory Visit: Payer: Managed Care, Other (non HMO) | Admitting: Physician Assistant

## 2023-02-14 ENCOUNTER — Encounter: Payer: Self-pay | Admitting: Physician Assistant

## 2023-02-14 DIAGNOSIS — I10 Essential (primary) hypertension: Secondary | ICD-10-CM

## 2023-02-14 MED ORDER — HYDRALAZINE HCL 25 MG PO TABS
25.0000 mg | ORAL_TABLET | Freq: Three times a day (TID) | ORAL | 0 refills | Status: DC
Start: 2023-02-14 — End: 2023-03-07

## 2023-02-14 MED ORDER — OLMESARTAN-AMLODIPINE-HCTZ 40-10-25 MG PO TABS
1.0000 | ORAL_TABLET | Freq: Every day | ORAL | 2 refills | Status: AC
Start: 2023-02-14 — End: 2023-05-15

## 2023-02-14 NOTE — Telephone Encounter (Signed)
  Chief Complaint: Hypertension Symptoms: BP 188/132, taken at workplace med facility this AM. Reports "Some chest tightness." States had headache and blurred vision over weekend, none presently. Frequency: This AM Pertinent Negatives: Patient denies  Disposition: [x] ED /[] Urgent Care (no appt availability in office) / [] Appointment(In office/virtual)/ []  [facility]/ [] Home Care/ [] Refused Recommended Disposition /[] [facility]/ []  Follow-up with PCP Additional Notes: Advised ED declines. Reiterated need for ED eval, declines "Went for same thing 3 weeks ago and they didn't do a thing."  Hydrographic surveyor for consult, Melissa.  Secured appt for 3:00 with Denny Peon as advised.. Advised pt she may be sent to ED from practice. Pt verbalizes understanding. Advised EMS for sudden severe headache, unilateral weakness, difficulty speaking,any worsening chest tightness. ED if blurred vision reoccurs. Pt verbalizes understanding.  Reason for Disposition  [1] Systolic BP  >= 160 OR Diastolic >= 100 AND [2] cardiac (e.g., breathing difficulty, chest pain) or neurologic symptoms (e.g., new-onset blurred or double vision, unsteady gait)  Answer Assessment - Initial Assessment Questions 1. BLOOD PRESSURE: "What is the blood pressure?" "Did you take at least two measurements 5 minutes apart?"     188/132 2. ONSET: "When did you take your blood pressure?"     This AM. 3. HOW: "How did you take your blood pressure?" (e.g., automatic home BP monitor, visiting nurse)     Checked at workplace med facility 4. HISTORY: "Do you have a history of high blood pressure?"     Yes 5. MEDICINES: "Are you taking any medicines for blood pressure?" "Have you missed any doses recently?"     Yes, no missed meds 6. OTHER SYMPTOMS: "Do you have any symptoms?" (e.g., blurred vision, chest pain, difficulty breathing, headache, weakness)     Headache at times, blurred vision this weekend. Chest  tightness  Protocols used: Blood Pressure - High-A-AH

## 2023-02-14 NOTE — Progress Notes (Signed)
Acute Office Visit   Patient: Tiffany Nunez   DOB: [birth year]   52 y.o. Female  MRN: [id number] Visit Date: 02/14/2023  Today's healthcare provider: Oswaldo Conroy Cielo Arias, PA-C  Introduced myself to the patient as a Secondary school teacher and provided education on APPs in clinical practice.    Chief Complaint  Patient presents with   Hypertension   Subjective    HPI   Hypertension: - Medications: Olmesartan-Amlodipine- HCTZ 40-5-12.5 mg PO QD  - Compliance: good compliance  - Checking BP at home: checking at work - elevated there as well   She reports some chest tightness. Headache and blurry vision over the weekend She was previously seen for this about 7 days ago and instructed to continue with her current regimen She was started on current BP medication on 01/31/23 after being evaluated for HTN urgency at ED that same day   She reports chest pain started on Sunday - in the center of left breast and comes and goes  She denies pain intensifying with exertion  She reports she fell at work last week  and landed on her left shoulder- she is not sure if pain is from fall vs radiation from chest  She denies diaphoresis, jaw pain or nausea, vomiting    Medications: Outpatient Medications Prior to Visit  Medication Sig   atorvastatin (LIPITOR) 20 MG tablet Take 1 tablet (20 mg total) by mouth daily.   Vitamin D, Ergocalciferol, (DRISDOL) 1.25 MG (50000 UNIT) CAPS capsule Take 1 capsule (50,000 Units total) by mouth every 7 (seven) days.   [DISCONTINUED] Olmesartan-amLODIPine-HCTZ 40-5-12.5 MG TABS Take 1 tablet by mouth daily.   Celecoxib (ELYXYB) 120 MG/4.8ML SOLN Take 120 mg by mouth daily as needed (Maximum 1 in 24 hours). (Patient not taking: Reported on 12/13/2021)   levothyroxine (SYNTHROID) 25 MCG tablet TAKE 1 TABLET BY MOUTH EVERY DAY BEFORE BREAKFAST (Patient not taking: Reported on 12/13/2021)   linaclotide (LINZESS) 145 MCG CAPS capsule Take 1 capsule (145 mcg total) by mouth daily  before breakfast. (Patient not taking: Reported on 12/13/2021)   nortriptyline (PAMELOR) 50 MG capsule TAKE 2 CAPSULES (100 MG TOTAL) BY MOUTH AT BEDTIME. (Patient not taking: Reported on 12/13/2021)   NOVOFINE AUTOCOVER PEN NEEDLE 30G X 8 MM MISC INJECT 1 EACH INTO THE SKIN AS NEEDED. AS DIRECTED (Patient not taking: Reported on 12/13/2021)   ondansetron (ZOFRAN ODT) 4 MG disintegrating tablet Take 1 tablet (4 mg total) by mouth every 8 (eight) hours as needed for nausea or vomiting. (Patient not taking: Reported on 12/13/2021)   Semaglutide (RYBELSUS) 3 MG TABS Take 1 tablet (3 mg total) by mouth daily. (Patient not taking: Reported on 02/14/2023)   TRESIBA FLEXTOUCH 100 UNIT/ML FlexTouch Pen INJECT 30-50 UNITS UNDER THE SKIN DAILY (Patient not taking: Reported on 12/13/2021)   [DISCONTINUED] ACCU-CHEK GUIDE test strip USE AS DIRECTED TO TEST FASTING BLOOD SUGAR TWICE A DAY (Patient not taking: Reported on 12/13/2021)   [DISCONTINUED] Accu-Chek Softclix Lancets lancets SMARTSIG:Topical (Patient not taking: Reported on 12/13/2021)   [DISCONTINUED] blood glucose meter kit and supplies Dispense based on patient and insurance preference. FSBS twice daily  (FOR ICD-10 E10.9, E11.9). (Patient not taking: Reported on 12/13/2021)   [DISCONTINUED] dapagliflozin propanediol (FARXIGA) 5 MG TABS tablet Take 1 tablet (5 mg total) by mouth daily before breakfast.   No facility-administered medications prior to visit.    Review of Systems  Constitutional:  Negative for diaphoresis.  Eyes:  Positive for visual disturbance (blurry vision).  Cardiovascular:  Positive for chest pain and palpitations. Negative for leg swelling.  Gastrointestinal:  Negative for diarrhea, nausea and vomiting.  Neurological:  Positive for headaches.       Objective    BP (!) 192/120   Pulse 96   Resp 16   Ht 5\' 6"  (1.676 m)   Wt 284 lb (128.8 kg)   SpO2 100%   BMI 45.84 kg/m    Physical Exam Vitals reviewed.  Constitutional:       General: She is awake.     Appearance: Normal appearance. She is well-developed and well-groomed.  HENT:     Head: Normocephalic and atraumatic.  Cardiovascular:     Rate and Rhythm: Normal rate and regular rhythm.     Pulses: Normal pulses.          Radial pulses are 2+ on the right side and 2+ on the left side.     Heart sounds: Normal heart sounds. No murmur heard.    No friction rub. No gallop.  Pulmonary:     Effort: Pulmonary effort is normal.     Breath sounds: Normal breath sounds. No decreased air movement. No decreased breath sounds, wheezing, rhonchi or rales.  Musculoskeletal:     Right lower leg: No edema.     Left lower leg: No edema.  Neurological:     Mental Status: She is alert.  Psychiatric:        Behavior: Behavior is cooperative.       No results found for any visits on 02/14/23.  Assessment & Plan      No follow-ups on file.      Problem List Items Addressed This Visit       Cardiovascular and Mediastinum   HTN (hypertension)    Chronic, historic condition, currently not well-managed Patient reports that her blood pressure is staying elevated when she is checking at work despite the good compliance with her current medication regimen She reports some chest tightness, headache, blurry vision over the weekend Given the fact that she is on a combination medication will increase to olmesartan-amlodipine-HCTZ 40 - 10 - 25 p.o. daily as well as add hydralazine 25 mg p.o. 3 times daily with goal of reducing her blood pressure I have also made a referral to the advanced hypertension clinic for further evaluation and potential management We reviewed continuing to take her blood pressure outside of the office and recording it for trending We also reviewed ED and return precautions I recommend that she returns to the office in about 3 weeks to assess response to these new meds Follow-up before then for progressing symptoms      Relevant Medications    Olmesartan-amLODIPine-HCTZ 40-10-25 MG TABS   hydrALAZINE (APRESOLINE) 25 MG tablet   Other Relevant Orders   Ambulatory referral to Advanced Hypertension [facility]     No follow-ups on file.   I, Clint Strupp E Tuan Tippin, PA-C, have reviewed all documentation for this visit. The documentation on 02/20/23 for the exam, diagnosis, procedures, and orders are all accurate and complete.   Jacquelin Hawking, MHS, PA-C [facility]

## 2023-02-14 NOTE — Patient Instructions (Signed)
Your blood pressure is very high- if it continues to stay elevated after trying the Hydralazine and taking the new dose of your combination medication you will need to be evaluated in the ED.   The Hydralazine should be taken every 8 hours. This should lower your blood pressure after the first few doses. The dose change to your combo medication can take a few more weeks to take full effect so please be sure to continue taking it  Your blood pressure was very elevated today.  If possible please take it at home using an electronic blood pressure cuff for the upper arm Record your blood pressure once per day and bring them back with you to your apt so we can make sure you are not developing high blood pressure.   Incorporating a minimum of 150 minutes (20-30 minutes per day) of moderate intensity physical activity can help improve your heart health and reduce the chances of high blood pressure and other cardiovascular risks. Incorporating a heart healthy diet can also help reduce the chances of heart attack and high cholesterol.

## 2023-02-17 ENCOUNTER — Encounter: Payer: Self-pay | Admitting: *Deleted

## 2023-02-20 NOTE — Assessment & Plan Note (Addendum)
Chronic, historic condition, currently not well-managed Patient reports that her blood pressure is staying elevated when she is checking at work despite the good compliance with her current medication regimen She reports some chest tightness, headache, blurry vision over the weekend Given the fact that she is on a combination medication will increase to olmesartan-amlodipine-HCTZ 40 - 10 - 25 p.o. daily as well as add hydralazine 25 mg p.o. 3 times daily with goal of reducing her blood pressure I have also made a referral to the advanced hypertension clinic for further evaluation and potential management We reviewed continuing to take her blood pressure outside of the office and recording it for trending We also reviewed ED and return precautions I recommend that she returns to the office in about 3 weeks to assess response to these new meds Follow-up before then for progressing symptoms

## 2023-02-27 ENCOUNTER — Encounter: Payer: Self-pay | Admitting: Physician Assistant

## 2023-02-27 ENCOUNTER — Other Ambulatory Visit: Payer: Self-pay

## 2023-02-27 DIAGNOSIS — E669 Obesity, unspecified: Secondary | ICD-10-CM

## 2023-02-27 MED ORDER — BLOOD GLUCOSE MONITORING SUPPL DEVI
1.0000 | Freq: Three times a day (TID) | 0 refills | Status: AC
Start: 2023-02-27 — End: ?

## 2023-02-27 MED ORDER — LANCET DEVICE MISC
1.0000 | Freq: Three times a day (TID) | 0 refills | Status: AC
Start: 2023-02-27 — End: 2023-03-29

## 2023-02-27 MED ORDER — BLOOD GLUCOSE TEST VI STRP
1.0000 | ORAL_STRIP | Freq: Three times a day (TID) | 0 refills | Status: DC
Start: 2023-02-27 — End: 2023-03-27

## 2023-02-27 MED ORDER — LANCETS MISC. MISC
1.0000 | Freq: Three times a day (TID) | 0 refills | Status: AC
Start: 2023-02-27 — End: 2023-03-29

## 2023-03-01 ENCOUNTER — Encounter: Payer: Self-pay | Admitting: Physician Assistant

## 2023-03-01 ENCOUNTER — Telehealth: Payer: Self-pay

## 2023-03-01 NOTE — Telephone Encounter (Signed)
Initiated PA for Rybelsus through cover my meds today. Unable to submit due to this outcome: This request has been approved using information available on the patient's profile. [id number];Review Type:Prior Auth;Coverage Start Date:01/30/2023;Coverage End Date:02/29/2024;

## 2023-03-06 NOTE — Progress Notes (Signed)
Name: Tiffany Nunez   MRN: [id number]    DOB: December 13, 1970   Date:03/07/2023       Progress Note  Subjective  Chief Complaint  Follow Up  HPI  Migraine headaches with aura  she lost to follow up with  Dr. Everlena Cooper,  Currently off all medications but she states migraine not as severe, episodes a couple of times a month , we will resume Nurtec to take prn   DMII: she was  on Tresiba  30 units , Ozempic 1 mg and Farxiga., however she got laid off July of 2022 and just got insurance again recently, she has been out of her medications for months. She was seen by Denny Peon Mucin a few weeks ago and was given Rybelsus due to A1C of 8.2 %, but just now able to fill rx. We will increase next dose to 7 mg and add [place] , she is trying to follow a diabetic diet. Denies polyphagia, polydipsia or polyuria   HTN: bp is at goal in our office, however it was high when she went to Ortho this am at 155/97, she has been taking Tribenzor again and has hydralazine to take up to bid. BP here was at goal. Explained bp can oscillate and to monitor at home   Major Depression: she has a history of recurrent depression. She denies suicidal thoughts or ideation, she took Duloxetine for about 6 months but has been off medicationn since 2021. Stable    Hypothyroidism: she stopped seeing Dr. Lucianne Muss  Last TSH was slightly elevated but normal T3 and T4 and off medications levothyroxine for 2 years, we will monitor for now   Morbid obesity: BMI is above 40, she is eating once a day. Discussed regular physical activity    History of CVA/Dyslipidemia :  She has insurance again and just resumed statin therapy .    Numbness right thumb: going on for about 6 months, no trauma, discussed NCS but she wants to think about it   Patient Active Problem List   Diagnosis Date Noted   Hypertension associated with type 2 diabetes mellitus (HCC) 03/18/2021   Mild episode of recurrent major depressive disorder (HCC) 05/28/2018   History of  cerebrovascular accident (CVA) involving cerebellum 06/12/2017   Acquired autoimmune hypothyroidism 11/09/2016   Vitamin D deficiency 05/03/2015   Dyslipidemia 05/03/2015   Gastro-esophageal reflux disease without esophagitis 05/03/2015   Alopecia 05/03/2015   Genital herpes 05/03/2015   Dysmetabolic syndrome 05/03/2015   Positive H. pylori test 05/03/2015   Neuralgia neuritis, sciatic nerve 05/03/2015   Migraine without aura and responsive to treatment 04/30/2014   HTN (hypertension) 01/22/2014   Morbid obesity (HCC) 01/22/2014   Type 2 diabetes mellitus with obesity (HCC) 01/22/2014   Decreased cardiac ejection fraction 01/22/2014    Past Surgical History:  Procedure Laterality Date   ABDOMINAL HYSTERECTOMY     ANKLE SURGERY     right   TUBAL LIGATION      Family History  Problem Relation Age of Onset   Hypertension Father    Hyperlipidemia Father    Hyperlipidemia Mother    Hypertension Mother    Hypertension Paternal Aunt    Cancer Maternal Grandmother        breast    Social History   Tobacco Use   Smoking status: Never   Smokeless tobacco: Never  Substance Use Topics   Alcohol use: No    Alcohol/week: 0.0 standard drinks of alcohol     Current Outpatient  Medications:    Accu-Chek Softclix Lancets lancets, SMARTSIG:Topical, Disp: , Rfl:    atorvastatin (LIPITOR) 20 MG tablet, Take 1 tablet (20 mg total) by mouth daily., Disp: 90 tablet, Rfl: 3   Blood Glucose Monitoring Suppl (ACCU-CHEK GUIDE) w/Device KIT, USE TO CHECK BLOOD GLUCOSE IN THE MORNING, AT NOON, AND AT BEDTIME, Disp: , Rfl:    Blood Glucose Monitoring Suppl DEVI, 1 each by Does not apply route in the morning, at noon, and at bedtime. May substitute to any manufacturer covered by patient's insurance., Disp: 1 each, Rfl: 0   dapagliflozin propanediol (FARXIGA) 10 MG TABS tablet, Take 1 tablet (10 mg total) by mouth daily., Disp: 30 tablet, Rfl: 2   Glucose Blood (BLOOD GLUCOSE TEST STRIPS) STRP, 1  each by In Vitro route in the morning, at noon, and at bedtime. May substitute to any manufacturer covered by patient's insurance., Disp: 90 each, Rfl: 0   Lancet Device MISC, 1 each by Does not apply route in the morning, at noon, and at bedtime. May substitute to any manufacturer covered by patient's insurance., Disp: 1 each, Rfl: 0   Lancets Misc. MISC, 1 each by Does not apply route in the morning, at noon, and at bedtime. May substitute to any manufacturer covered by patient's insurance., Disp: 100 each, Rfl: 0   NOVOFINE AUTOCOVER PEN NEEDLE 30G X 8 MM MISC, INJECT 1 EACH INTO THE SKIN AS NEEDED. AS DIRECTED, Disp: 100 each, Rfl: 1   Olmesartan-amLODIPine-HCTZ 40-10-25 MG TABS, Take 1 tablet by mouth daily., Disp: 30 tablet, Rfl: 2   Rimegepant Sulfate (NURTEC) 75 MG TBDP, Take 1 tablet (75 mg total) by mouth daily as needed., Disp: 16 tablet, Rfl: 2   Semaglutide (RYBELSUS) 7 MG TABS, Take 1 tablet (7 mg total) by mouth daily., Disp: 30 tablet, Rfl: 1   Vitamin D, Ergocalciferol, (DRISDOL) 1.25 MG (50000 UNIT) CAPS capsule, Take 1 capsule (50,000 Units total) by mouth every 7 (seven) days., Disp: 12 capsule, Rfl: 1   hydrALAZINE (APRESOLINE) 25 MG tablet, Take 1 tablet (25 mg total) by mouth 2 (two) times daily as needed., Disp: 60 tablet, Rfl: 0  Allergies  Allergen Reactions   Imitrex [Sumatriptan] Anaphylaxis   Topamax [Topiramate] Other (See Comments)    Caused hand numbness    Ubrogepant Other (See Comments)    Ubrelvy made her feel groggy the following day and bad taste in her month     I personally reviewed active problem list, medication list, allergies, family history, social history, health maintenance with the patient/caregiver today.   ROS  Ten systems reviewed and is negative except as mentioned in HPI   Objective  Vitals:   03/07/23 1542  BP: 118/82  Pulse: 90  Resp: 16  Temp: 97.8 F (36.6 C)  TempSrc: Oral  SpO2: 98%  Weight: 284 lb (128.8 kg)  Height: 5'  6" (1.676 m)    Body mass index is 45.84 kg/m.  Physical Exam  Constitutional: Patient appears well-developed and well-nourished. Obese  No distress.  HEENT: head atraumatic, normocephalic, pupils equal and reactive to light, neck supple Cardiovascular: Normal rate, regular rhythm and normal heart sounds.  No murmur heard. No BLE edema. Pulmonary/Chest: Effort normal and breath sounds normal. No respiratory distress. Abdominal: Soft.  There is no tenderness. Psychiatric: Patient has a normal mood and affect. behavior is normal. Judgment and thought content normal.    Diabetic Foot Exam: Diabetic Foot Exam - Simple   Simple Foot Form Visual Inspection  No deformities, no ulcerations, no other skin breakdown bilaterally: Yes Sensation Testing Intact to touch and monofilament testing bilaterally: Yes Pulse Check Posterior Tibialis and Dorsalis pulse intact bilaterally: Yes Comments      PHQ2/9:    03/07/2023    3:40 PM 02/14/2023    2:49 PM 02/07/2023    8:22 AM 12/13/2021    2:48 PM 03/18/2021    8:03 AM  Depression screen PHQ 2/9  Decreased Interest 0 0 0 0 0  Down, Depressed, Hopeless 0 0 0 0 0  PHQ - 2 Score 0 0 0 0 0  Altered sleeping 0  0 0 1  Tired, decreased energy 0  0 0 0  Change in appetite 0  0 0 0  Feeling bad or failure about yourself  0  0 0 0  Trouble concentrating 0  0 0 0  Moving slowly or fidgety/restless 0  0 0 0  Suicidal thoughts 0  0 0 0  PHQ-9 Score 0  0 0 1  Difficult doing work/chores   Not difficult at all Not difficult at all     phq 9 is negative   Fall Risk:    03/07/2023    3:40 PM 02/14/2023    2:48 PM 02/07/2023    8:20 AM 12/13/2021    2:48 PM 07/07/2021    8:12 AM  Fall Risk   Falls in the past year? 0 0 0 0 0  Number falls in past yr:  0 0 0 0  Injury with Fall?  0 0 0 0  Risk for fall due to : No Fall Risks No Fall Risks  No Fall Risks   Follow up Falls prevention discussed Falls prevention discussed  Education provided;Falls  prevention discussed       Functional Status Survey: Is the patient deaf or have difficulty hearing?: No Does the patient have difficulty seeing, even when wearing glasses/contacts?: No Does the patient have difficulty concentrating, remembering, or making decisions?: No Does the patient have difficulty walking or climbing stairs?: No Does the patient have difficulty dressing or bathing?: No Does the patient have difficulty doing errands alone such as visiting a doctor's office or shopping?: No    Assessment & Plan  1. Dyslipidemia associated with type 2 diabetes mellitus (HCC)  - Urine Microalbumin w/creat. ratio - HM Diabetes Foot Exam - Semaglutide (RYBELSUS) 7 MG TABS; Take 1 tablet (7 mg total) by mouth daily.  Dispense: 30 tablet; Refill: 1 - dapagliflozin propanediol (FARXIGA) 10 MG TABS tablet; Take 1 tablet (10 mg total) by mouth daily.  Dispense: 30 tablet; Refill: 2  2. Morbid obesity (HCC)  Discussed with the patient the risk posed by an increased BMI. Discussed importance of portion control, calorie counting and at least 150 minutes of physical activity weekly. Avoid sweet beverages and drink more water. Eat at least 6 servings of fruit and vegetables daily    3. Vitamin D deficiency  Continue supplementation   4. Essential hypertension  - hydrALAZINE (APRESOLINE) 25 MG tablet; Take 1 tablet (25 mg total) by mouth 2 (two) times daily as needed.  Dispense: 60 tablet; Refill: 0  5. Migraine without aura and responsive to treatment  - Rimegepant Sulfate (NURTEC) 75 MG TBDP; Take 1 tablet (75 mg total) by mouth daily as needed.  Dispense: 16 tablet; Refill: 2

## 2023-03-07 ENCOUNTER — Encounter: Payer: Self-pay | Admitting: Family Medicine

## 2023-03-07 ENCOUNTER — Ambulatory Visit (INDEPENDENT_AMBULATORY_CARE_PROVIDER_SITE_OTHER): Payer: No Typology Code available for payment source | Admitting: Family Medicine

## 2023-03-07 VITALS — BP 118/82 | HR 90 | Temp 97.8°F | Resp 16 | Ht 66.0 in | Wt 284.0 lb

## 2023-03-07 DIAGNOSIS — E559 Vitamin D deficiency, unspecified: Secondary | ICD-10-CM | POA: Diagnosis not present

## 2023-03-07 DIAGNOSIS — E1169 Type 2 diabetes mellitus with other specified complication: Secondary | ICD-10-CM | POA: Diagnosis not present

## 2023-03-07 DIAGNOSIS — I10 Essential (primary) hypertension: Secondary | ICD-10-CM | POA: Diagnosis not present

## 2023-03-07 DIAGNOSIS — E785 Hyperlipidemia, unspecified: Secondary | ICD-10-CM

## 2023-03-07 DIAGNOSIS — G43009 Migraine without aura, not intractable, without status migrainosus: Secondary | ICD-10-CM

## 2023-03-07 DIAGNOSIS — Z7984 Long term (current) use of oral hypoglycemic drugs: Secondary | ICD-10-CM

## 2023-03-07 MED ORDER — NURTEC 75 MG PO TBDP
1.0000 | ORAL_TABLET | Freq: Every day | ORAL | 2 refills | Status: AC | PRN
Start: 2023-03-07 — End: ?

## 2023-03-07 MED ORDER — HYDRALAZINE HCL 25 MG PO TABS
25.0000 mg | ORAL_TABLET | Freq: Two times a day (BID) | ORAL | 0 refills | Status: DC | PRN
Start: 2023-03-07 — End: 2023-03-31

## 2023-03-07 MED ORDER — RYBELSUS 7 MG PO TABS
7.0000 mg | ORAL_TABLET | Freq: Every day | ORAL | 1 refills | Status: DC
Start: 2023-03-07 — End: 2023-03-14

## 2023-03-07 MED ORDER — DAPAGLIFLOZIN PROPANEDIOL 10 MG PO TABS
10.0000 mg | ORAL_TABLET | Freq: Every day | ORAL | 2 refills | Status: AC
Start: 2023-03-07 — End: ?

## 2023-03-09 ENCOUNTER — Encounter: Payer: Self-pay | Admitting: Family Medicine

## 2023-03-14 ENCOUNTER — Other Ambulatory Visit: Payer: Self-pay | Admitting: Family Medicine

## 2023-03-14 ENCOUNTER — Encounter: Payer: Self-pay | Admitting: Physician Assistant

## 2023-03-26 ENCOUNTER — Other Ambulatory Visit: Payer: Self-pay | Admitting: Family Medicine

## 2023-03-26 DIAGNOSIS — E1169 Type 2 diabetes mellitus with other specified complication: Secondary | ICD-10-CM

## 2023-03-30 ENCOUNTER — Other Ambulatory Visit: Payer: Self-pay | Admitting: Family Medicine

## 2023-03-30 DIAGNOSIS — I10 Essential (primary) hypertension: Secondary | ICD-10-CM

## 2023-04-13 ENCOUNTER — Encounter: Payer: Self-pay | Admitting: Family Medicine

## 2023-04-14 ENCOUNTER — Other Ambulatory Visit: Payer: Self-pay | Admitting: Family Medicine

## 2023-04-14 DIAGNOSIS — I10 Essential (primary) hypertension: Secondary | ICD-10-CM

## 2023-04-14 MED ORDER — METFORMIN HCL ER 750 MG PO TB24: 750.0000 mg | ORAL_TABLET | Freq: Every day | ORAL | 0 refills | Status: DC

## 2023-04-18 ENCOUNTER — Other Ambulatory Visit: Payer: Self-pay | Admitting: Family Medicine

## 2023-04-18 MED ORDER — PIOGLITAZONE HCL 15 MG PO TABS: 15.0000 mg | ORAL_TABLET | Freq: Every day | ORAL | 0 refills | Status: AC

## 2023-05-05 ENCOUNTER — Other Ambulatory Visit: Payer: Self-pay

## 2023-05-05 ENCOUNTER — Emergency Department: Payer: Managed Care, Other (non HMO)

## 2023-05-05 ENCOUNTER — Emergency Department
Admission: EM | Admit: 2023-05-05 | Discharge: 2023-05-05 | Disposition: A | Discharge status: Home / Self Care | Payer: Managed Care, Other (non HMO) | Attending: Emergency Medicine | Admitting: Emergency Medicine

## 2023-05-05 DIAGNOSIS — H538 Other visual disturbances: Secondary | ICD-10-CM | POA: Insufficient documentation

## 2023-05-05 DIAGNOSIS — R42 Dizziness and giddiness: Secondary | ICD-10-CM | POA: Diagnosis not present

## 2023-05-05 DIAGNOSIS — I10 Essential (primary) hypertension: Secondary | ICD-10-CM | POA: Diagnosis not present

## 2023-05-05 DIAGNOSIS — R200 Anesthesia of skin: Secondary | ICD-10-CM

## 2023-05-05 DIAGNOSIS — R202 Paresthesia of skin: Secondary | ICD-10-CM | POA: Diagnosis not present

## 2023-05-05 DIAGNOSIS — R2681 Unsteadiness on feet: Secondary | ICD-10-CM | POA: Diagnosis not present

## 2023-05-05 DIAGNOSIS — E119 Type 2 diabetes mellitus without complications: Secondary | ICD-10-CM | POA: Diagnosis not present

## 2023-05-05 LAB — MAGNESIUM: Magnesium: 2.1 mg/dL (ref 1.7–2.4)

## 2023-05-05 LAB — COMPREHENSIVE METABOLIC PANEL
ALT: 122 U/L — ABNORMAL HIGH (ref 0–44)
AST: 65 U/L — ABNORMAL HIGH (ref 15–41)
Albumin: 3.9 g/dL (ref 3.5–5.0)
Alkaline Phosphatase: 116 U/L (ref 38–126)
Anion gap: 10 (ref 5–15)
BUN: 15 mg/dL (ref 6–20)
CO2: 27 mmol/L (ref 22–32)
Calcium: 9.9 mg/dL (ref 8.9–10.3)
Chloride: 101 mmol/L (ref 98–111)
Creatinine, Ser: 0.87 mg/dL (ref 0.44–1.00)
GFR, Estimated: >60 mL/min (ref >60)
Glucose, Bld: 170 mg/dL — ABNORMAL HIGH (ref 70–99)
Potassium: 3.3 mmol/L — ABNORMAL LOW (ref 3.5–5.1)
Sodium: 138 mmol/L (ref 135–145)
Total Bilirubin: 0.5 mg/dL (ref 0.3–1.2)
Total Protein: 8.4 g/dL — ABNORMAL HIGH (ref 6.5–8.1)

## 2023-05-05 LAB — CBC
HCT: 41.7 % (ref 36.0–46.0)
Hemoglobin: 13.8 g/dL (ref 12.0–15.0)
MCH: 29.3 pg (ref 26.0–34.0)
MCHC: 33.1 g/dL (ref 30.0–36.0)
MCV: 88.5 fL (ref 80.0–100.0)
Platelets: 403 10*3/uL — ABNORMAL HIGH (ref 150–400)
RBC: 4.71 MIL/uL (ref 3.87–5.11)
RDW: 13.2 % (ref 11.5–15.5)
WBC: 7.5 10*3/uL (ref 4.0–10.5)
nRBC: 0 % (ref 0.0–0.2)

## 2023-05-05 LAB — DIFFERENTIAL
Abs Immature Granulocytes: 0.01 10*3/uL (ref 0.00–0.07)
Basophils Absolute: 0.1 10*3/uL (ref 0.0–0.1)
Basophils Relative: 1 %
Eosinophils Absolute: 0.1 10*3/uL (ref 0.0–0.5)
Eosinophils Relative: 1 %
Immature Granulocytes: 0 %
Lymphocytes Relative: 43 %
Lymphs Abs: 3.2 10*3/uL (ref 0.7–4.0)
Monocytes Absolute: 0.5 10*3/uL (ref 0.1–1.0)
Monocytes Relative: 6 %
Neutro Abs: 3.7 10*3/uL (ref 1.7–7.7)
Neutrophils Relative %: 49 %

## 2023-05-05 MED ORDER — SODIUM CHLORIDE 0.9% FLUSH: 3.0000 mL | Freq: Once | INTRAVENOUS | Status: DC

## 2023-05-05 MED ORDER — POTASSIUM CHLORIDE CRYS ER 20 MEQ PO TBCR
40.0000 meq | EXTENDED_RELEASE_TABLET | Freq: Once | ORAL | Status: AC
  Administered 2023-05-05: 40 meq via ORAL
  Filled 2023-05-05: qty 2

## 2023-05-05 NOTE — ED Triage Notes (Signed)
Pt comes with c/o two weeks of dizziness. Pt states she feels like a cloud is over her and messing with her eyes. Pt states some blurry vision. Pt states some numbness to left side this past week. Pt states it resolved.

## 2023-05-05 NOTE — ED Provider Notes (Signed)
[facility] Provider Note    Event Date/Time   First MD Initiated Contact with Patient 05/05/23 1438     (approximate)   History   Chief Complaint Dizziness   HPI  Tiffany Nunez is a 52 y.o. female with past medical history of hypertension, diabetes, stroke, and migraines who presents to the ED complaining of dizziness.  Patient reports that over the past 2 and half weeks she has been having intermittent sensation of dizziness and unsteadiness on her feet.  The symptoms can come on at any time, whether she is sitting still or up and moving.  It has been associated with cloudiness in her vision on both sides as well as some intermittent numbness over the left side of her body.  She denies any focal weakness and has not had any changes in her speech.  She currently has some left-sided numbness but denies any dizziness currently.     Physical Exam   Triage Vital Signs: ED Triage Vitals  Encounter Vitals Group     BP 05/05/23 1359 (!) 138/93     Systolic BP Percentile --      Diastolic BP Percentile --      Pulse Rate 05/05/23 1359 83     Resp 05/05/23 1359 18     Temp 05/05/23 1359 98.2 F (36.8 C)     Temp Source 05/05/23 1359 Oral     SpO2 05/05/23 1359 97 %     Weight --      Height --      Head Circumference --      Peak Flow --      Pain Score 05/05/23 1410 0     Pain Loc --      Pain Education --      Exclude from Growth Chart --     Most recent vital signs: Vitals:   05/05/23 1359 05/05/23 1700  BP: (!) 138/93 (!) 140/90  Pulse: 83 80  Resp: 18 16  Temp: 98.2 F (36.8 C) 98.5 F (36.9 C)  SpO2: 97% 98%    Constitutional: Alert and oriented. Eyes: Conjunctivae are normal.  Pupils equal, round, and reactive to light bilaterally.  Extraocular movements intact without nystagmus. Head: Atraumatic. Nose: No congestion/rhinnorhea. Mouth/Throat: Mucous membranes are moist.  Cardiovascular: Normal rate, regular rhythm. Grossly normal  heart sounds.  2+ radial pulses bilaterally. Respiratory: Normal respiratory effort.  No retractions. Lungs CTAB. Gastrointestinal: Soft and nontender. No distention. Musculoskeletal: No lower extremity tenderness nor edema.  Neurologic:  Normal speech and language. No gross focal neurologic deficits are appreciated.    ED Results / Procedures / Treatments   Labs (all labs ordered are listed, but only abnormal results are displayed) Labs Reviewed  CBC - Abnormal; Notable for the following components:      Result Value   Platelets 403 (*)    All other components within normal limits  COMPREHENSIVE METABOLIC PANEL - Abnormal; Notable for the following components:   Potassium 3.3 (*)    Glucose, Bld 170 (*)    Total Protein 8.4 (*)    AST 65 (*)    ALT 122 (*)    All other components within normal limits  DIFFERENTIAL  MAGNESIUM  CBG MONITORING, ED     EKG  ED ECG REPORT I, Chesley Noon, the attending physician, personally viewed and interpreted this ECG.   Date: 05/05/2023  EKG Time: 13:52  Rate: 80  Rhythm: normal sinus rhythm  Axis: Normal  Intervals:none  ST&T Change: None  RADIOLOGY CT head reviewed and interpreted by me with no hemorrhage or midline shift.  PROCEDURES:  Critical Care performed: No  Procedures   MEDICATIONS ORDERED IN ED: Medications  sodium chloride flush (NS) 0.9 % injection 3 mL (3 mLs Intravenous Not Given 05/05/23 1512)  potassium chloride SA (KLOR-CON M) CR tablet 40 mEq (40 mEq Oral Given 05/05/23 1647)     IMPRESSION / MDM / ASSESSMENT AND PLAN / ED COURSE  I reviewed the triage vital signs and the nursing notes.                              52 y.o. female with past medical history of hypertension, diabetes, stroke, and migraines who presents to the ED complaining of intermittent dizziness and unsteadiness on her feet for the past 2-1/2 weeks with some blurry vision, now with left-sided numbness but no weakness.  Patient's  presentation is most consistent with acute presentation with potential threat to life or bodily function.  Differential diagnosis includes, but is not limited to, stroke, TIA, complicated migraine, anemia, electrolyte abnormality.  Patient nontoxic-appearing and in no acute distress, vital signs are unremarkable.  EKG shows no evidence of arrhythmia or ischemia.  Labs remarkable for mild transaminitis but patient has no upper abdominal pain to raise suspicion for acute hepatitis or biliary process.  No significant electrolyte abnormality or AKI noted and no significant anemia or leukocytosis.  CT imaging shows old stroke but no acute findings, will further assess with MRI to rule out acute stroke.  MRI brain is negative for acute process, no evidence of stroke.  Patient denies any alcohol consumption that could contribute to her transaminitis, this does not appear related to her symptoms however.  Magnesium level within normal limits.  Patient appropriate for outpatient follow-up with neurology, was counseled to have repeat labs performed to recheck her LFTs.  She was counseled to return to the ED for new or worsening symptoms.  Patient agrees with plan.      FINAL CLINICAL IMPRESSION(S) / ED DIAGNOSES   Final diagnoses:  Dizziness  Numbness     Rx / DC Orders   ED Discharge Orders     None        Note:  This document was prepared using Dragon voice recognition software and may include unintentional dictation errors.   Chesley Noon, MD 05/05/23 [phone]

## 2023-05-10 ENCOUNTER — Ambulatory Visit: Payer: Managed Care, Other (non HMO) | Admitting: Family Medicine

## 2023-05-12 ENCOUNTER — Other Ambulatory Visit: Payer: Self-pay | Admitting: Physician Assistant

## 2023-05-12 DIAGNOSIS — I10 Essential (primary) hypertension: Secondary | ICD-10-CM

## 2023-05-12 NOTE — Telephone Encounter (Signed)
Requested medications are due for refill today.  yes  Requested medications are on the active medications list.  yes  Last refill. 02/14/2023 #30 2 rf  Future visit scheduled.   no  Notes to clinic.  Crystal king listed as PCP. Per cancellation of 05/10/2023 - Pt is going to a different provider.    Requested Prescriptions  Pending Prescriptions Disp Refills   Olmesartan-amLODIPine-HCTZ 40-10-25 MG TABS [Pharmacy Med Name: OLMSRTN-AMLDPN-HCTZ 40-10-25MG ] 90 tablet     Sig: TAKE 1 TABLET BY MOUTH EVERY DAY     Cardiovascular: CCB + ARB + Diuretic Combos Failed - 05/12/2023  2:45 AM      Failed - K in normal range and within 180 days    Potassium  Date Value Ref Range Status  05/05/2023 3.3 (L) 3.5 - 5.1 mmol/L Final  07/23/2014 3.2 (L) 3.5 - 5.1 mmol/L Final         Failed - Last BP in normal range    BP Readings from Last 1 Encounters:  05/05/23 (!) 140/90         Passed - Na in normal range and within 180 days    Sodium  Date Value Ref Range Status  05/05/2023 138 135 - 145 mmol/L Final  07/23/2014 141 136 - 145 mmol/L Final         Passed - Cr in normal range and within 180 days    Creat  Date Value Ref Range Status  02/07/2023 0.96 0.50 - 1.03 mg/dL Final   Creatinine, Ser  Date Value Ref Range Status  05/05/2023 0.87 0.44 - 1.00 mg/dL Final   Creatinine, Urine  Date Value Ref Range Status  10/29/2019 102 20 - 275 mg/dL Final         Passed - eGFR is 10 or above and within 180 days    GFR, Est African American  Date Value Ref Range Status  12/10/2020 75 > OR = 60 mL/min/1.88m2 Final   GFR, Est Non African American  Date Value Ref Range Status  12/10/2020 65 > OR = 60 mL/min/1.73m2 Final   GFR, Estimated  Date Value Ref Range Status  05/05/2023 >60 >60 mL/min Final    Comment:    (NOTE) Calculated using the CKD-EPI Creatinine Equation (2021)    eGFR  Date Value Ref Range Status  02/07/2023 72 > OR = 60 mL/min/1.41m2 Final         Passed -  Patient is not pregnant      Passed - Last Heart Rate in normal range    Pulse Readings from Last 1 Encounters:  05/05/23 80         Passed - Valid encounter within last 6 months    Recent Outpatient Visits           2 months ago Dyslipidemia associated with type 2 diabetes mellitus [facility])   [facility] Alba Cory, MD   2 months ago Primary hypertension   [facility] Mecum, Oswaldo Conroy, PA-C   3 months ago Primary hypertension   [facility] Mecum, Oswaldo Conroy, PA-C   1 year ago Migraine without aura and responsive to treatment   [facility] Danelle Berry, PA-C   2 years ago Diabetes mellitus type 2 in obese [facility])   [facility] Alba Cory, MD

## 2023-06-24 ENCOUNTER — Ambulatory Visit (INDEPENDENT_AMBULATORY_CARE_PROVIDER_SITE_OTHER): Payer: Managed Care, Other (non HMO)

## 2023-06-24 ENCOUNTER — Ambulatory Visit
Admission: EM | Admit: 2023-06-24 | Discharge: 2023-06-24 | Disposition: A | Discharge status: Home / Self Care | Payer: Managed Care, Other (non HMO) | Attending: Family Medicine | Admitting: Family Medicine

## 2023-06-24 DIAGNOSIS — M25561 Pain in right knee: Secondary | ICD-10-CM | POA: Diagnosis not present

## 2023-06-24 MED ORDER — CYCLOBENZAPRINE HCL 10 MG PO TABS: 10.0000 mg | ORAL_TABLET | Freq: Two times a day (BID) | ORAL | 0 refills | Status: AC | PRN

## 2023-06-24 MED ORDER — NAPROXEN 500 MG PO TABS: 500.0000 mg | ORAL_TABLET | Freq: Two times a day (BID) | ORAL | 0 refills | Status: AC

## 2023-06-24 MED ORDER — KETOROLAC TROMETHAMINE 60 MG/2ML IM SOLN
30.0000 mg | Freq: Once | INTRAMUSCULAR | Status: AC
  Administered 2023-06-24: 30 mg via INTRAMUSCULAR

## 2023-06-24 NOTE — Discharge Instructions (Addendum)
Your x-ray did not show any broken or dislocated bones.  You do have a bone spur in some of the tendons in your knee.  Follow-up with an orthopedic provider such as [facility] ([place] or [place]) or Dr. Joseph Berkshire for further evaluation.  If medication was prescribed, stop by the pharmacy to pick up your prescriptions.  For your  pain, Take 1500 mg Tylenol twice a day, take muscle relaxer (Flexeril) twice a day, take Naprosyn twice a day,  as needed for pain. Rest and elevate the affected painful area.  Apply warm compresses intermittently, as needed.  As pain recedes, begin normal activities slowly as tolerated.

## 2023-06-24 NOTE — ED Triage Notes (Signed)
Right knee pain x 1.5 month.

## 2023-06-24 NOTE — ED Provider Notes (Signed)
[facility]    CSN: [id number] Arrival date & time: 06/24/23  0810      History   Chief Complaint Chief Complaint  Patient presents with   Knee Pain    HPI  HPI Tiffany Nunez is a 52 y.o. female.   Tiffany Nunez presents for right knee pain over the last 6 weeks. She initially injured it last year while moving.  She heard it pop walking the stairs carrying boxes.    Has soreness and throbbing pain.  Turning in the bed and movement makes the pain worse. She has been icing it, applying heat, using lidocaine and put a pillow under it which use to help but not anymore.  Took ibuprofen intermittently.  No falls.  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 Knee feels like it shifts sometimes when she walks.      Past Medical History:  Diagnosis Date   Anemia    Depressive disorder    Diabetes mellitus, type II (HCC)    Esophageal reflux    Essential hypertension, benign    Genital herpes, unspecified    Helicobacter pylori (H. pylori)    Metabolic syndrome    Migraine, unspecified, without mention of intractable migraine without mention of status migrainosus    Nonspecific abnormal electrocardiogram (ECG) (EKG)    Nonspecific abnormal results of thyroid function study    Obesity, unspecified    Other and unspecified hyperlipidemia    Unspecified vitamin D deficiency     Patient Active Problem List   Diagnosis Date Noted   Hypertension associated with type 2 diabetes mellitus (HCC) 03/18/2021   Mild episode of recurrent major depressive disorder (HCC) 05/28/2018   History of cerebrovascular accident (CVA) involving cerebellum 06/12/2017   Acquired autoimmune hypothyroidism 11/09/2016   Vitamin D deficiency 05/03/2015   Dyslipidemia 05/03/2015   Gastro-esophageal reflux disease without esophagitis 05/03/2015   Alopecia 05/03/2015   Genital herpes 05/03/2015   Dysmetabolic syndrome 05/03/2015   Positive H.  pylori test 05/03/2015   Neuralgia neuritis, sciatic nerve 05/03/2015   Migraine without aura and responsive to treatment 04/30/2014   HTN (hypertension) 01/22/2014   Morbid obesity (HCC) 01/22/2014   Type 2 diabetes mellitus with obesity (HCC) 01/22/2014   Decreased cardiac ejection fraction 01/22/2014    Past Surgical History:  Procedure Laterality Date   ABDOMINAL HYSTERECTOMY     ANKLE SURGERY     right   TUBAL LIGATION      OB History   No obstetric history on file.      Home Medications    Prior to Admission medications   Medication Sig Start Date End Date Taking? Authorizing Provider  atorvastatin (LIPITOR) 20 MG tablet Take 1 tablet (20 mg total) by mouth daily. 02/08/23  Yes Mecum, Erin E, PA-C  cyclobenzaprine (FLEXERIL) 10 MG tablet Take 1 tablet (10 mg total) by mouth 2 (two) times daily as needed for muscle spasms. 06/24/23  Yes Shaheen Star, DO  dapagliflozin propanediol (FARXIGA) 10 MG TABS tablet Take 1 tablet (10 mg total) by mouth daily. 03/07/23  Yes Sowles, Danna Hefty, MD  hydrALAZINE (APRESOLINE) 25 MG tablet TAKE 1 TABLET BY MOUTH TWICE A DAY AS NEEDED 04/14/23  Yes Sowles, Danna Hefty, MD  naproxen (NAPROSYN) 500 MG tablet Take 1 tablet (500 mg total) by mouth 2 (two) times daily with a meal. 06/24/23  Yes Kaysa Roulhac, DO  NOVOFINE AUTOCOVER PEN NEEDLE 30G X  8 MM MISC INJECT 1 EACH INTO THE SKIN AS NEEDED. AS DIRECTED 12/12/20  Yes Sowles, Danna Hefty, MD  Rimegepant Sulfate (NURTEC) 75 MG TBDP Take 1 tablet (75 mg total) by mouth daily as needed. 03/07/23  Yes Sowles, Danna Hefty, MD  Vitamin D, Ergocalciferol, (DRISDOL) 1.25 MG (50000 UNIT) CAPS capsule Take 1 capsule (50,000 Units total) by mouth every 7 (seven) days. 02/08/23  Yes Mecum, Erin E, PA-C  Accu-Chek Softclix Lancets lancets SMARTSIG:Topical 02/27/23   [provider]  Blood Glucose Monitoring Suppl (ACCU-CHEK GUIDE) w/Device KIT USE TO CHECK BLOOD GLUCOSE IN THE MORNING, AT NOON, AND AT BEDTIME  02/27/23   [provider]  Blood Glucose Monitoring Suppl DEVI 1 each by Does not apply route in the morning, at noon, and at bedtime. May substitute to any manufacturer covered by patient's insurance. 02/27/23   Sowles, Danna Hefty, MD  glucose blood (ACCU-CHEK GUIDE) test strip USE TO TEST IN THE MORNING, AT NOON, AND AT BEDTIME 03/27/23   Alba Cory, MD  Olmesartan-amLODIPine-HCTZ 40-10-25 MG TABS Take 1 tablet by mouth daily. 02/14/23 05/15/23  Mecum, Erin E, PA-C  pioglitazone (ACTOS) 15 MG tablet Take 1 tablet (15 mg total) by mouth daily. 04/18/23   Alba Cory, MD    Family History Family History  Problem Relation Age of Onset   Hypertension Father    Hyperlipidemia Father    Hyperlipidemia Mother    Hypertension Mother    Hypertension Paternal Aunt    Cancer Maternal Grandmother        breast    Social History Social History   Tobacco Use   Smoking status: Never   Smokeless tobacco: Never  Vaping Use   Vaping status: Never Used  Substance Use Topics   Alcohol use: No    Alcohol/week: 0.0 standard drinks of alcohol   Drug use: No     Allergies   Imitrex [sumatriptan], Topamax [topiramate], and Ubrogepant   Review of Systems Review of Systems: :negative unless otherwise stated in HPI.      Physical Exam Triage Vital Signs ED Triage Vitals  Encounter Vitals Group     BP 06/24/23 0826 (!) 153/90     Systolic BP Percentile --      Diastolic BP Percentile --      Pulse Rate 06/24/23 0826 78     Resp 06/24/23 0826 17     Temp 06/24/23 0826 98.6 F (37 C)     Temp Source 06/24/23 0826 Oral     SpO2 06/24/23 0826 96 %     Weight --      Height --      Head Circumference --      Peak Flow --      Pain Score 06/24/23 0825 9     Pain Loc --      Pain Education --      Exclude from Growth Chart --    No data found.  Updated Vital Signs BP (!) 153/90 (BP Location: Right Arm)   Pulse 78   Temp 98.6 F (37 C) (Oral)   Resp 17   SpO2 96%    Visual Acuity Right Eye Distance:   Left Eye Distance:   Bilateral Distance:    Right Eye Near:   Left Eye Near:    Bilateral Near:     Physical Exam GEN: well appearing female in no acute distress  CVS: well perfused  RESP: speaking in full sentences without pause, no respiratory distress  MSK:  Right Knee Exam -Inspection: no deformity, no discoloration, mild edema  -Palpation: medial joint line tenderness, anterior tenderness, no effusion,  -ROM: Extension: good extension though with pain, limited flexion 2/2 pain  -Special Tests: Varus Stress: Negative; Valgus Stress: positive; Anterior drawer: Negative; Posterior drawer: Negative; Thessaly: positive; Patellar grind: Negative -Limb neurovascularly intact, no instability noted    UC Treatments / Results  Labs (all labs ordered are listed, but only abnormal results are displayed) Labs Reviewed - No data to display  EKG   Radiology DG Knee Complete 4 Views Right  Result Date: 06/24/2023 CLINICAL DATA:  Anterior and medial knee pain for 6 weeks without trauma EXAM: RIGHT KNEE - COMPLETE 4+ VIEW COMPARISON:  None Available. FINDINGS: No acute fracture or dislocation. Enthesophytes at the quadriceps insertion and patellar tendon origin. No joint effusion. Joint spaces maintained. IMPRESSION: No acute osseous abnormality. Electronically Signed   By: Jeronimo Greaves M.D.   On: 06/24/2023 09:29     Procedures Procedures (including critical care time)  Medications Ordered in UC Medications  ketorolac (TORADOL) injection 30 mg (30 mg Intramuscular Given 06/24/23 0906)    Initial Impression / Assessment and Plan / UC Course  I have reviewed the triage vital signs and the nursing notes.  Pertinent labs & imaging results that were available during my care of the patient were reviewed by me and considered in my medical decision making (see chart for details).      Pt is a 52 y.o.  female with chronic right knee pain who  presents for 6 weeks of worsening right knee pain after hitting a desk at work. On exam, pt has tenderness at anterior and medially. Obtained right knee plain films.  Personally interpreted by me were unremarkable for fracture or dislocation. Radiologist report reviewed and additionally notes bone spurs in the patellar and quadricep tendon.  Given Toradol IM .  Unfortunately we did not have a knee brace that would fit her.  Patient to gradually return to normal activities, as tolerated and continue ordinary activities within the limits permitted by pain. Prescribed Naproxen sodium  and muscle relaxer  for pain relief.  Tylenol PRN. Advised patient to avoid OTC NSAIDs while taking prescription NSAID. Counseled patient on red flag symptoms and when to seek immediate care.   Patient to follow up with orthopedic provider.  Return and ED precautions given. Understanding voiced. Discussed MDM, treatment plan and plan for follow-up with patient who agrees with plan.   Final Clinical Impressions(s) / UC Diagnoses   Final diagnoses:  Acute pain of right knee     Discharge Instructions      Your x-ray did not show any broken or dislocated bones.  You do have a bone spur in some of the tendons in your knee.  Follow-up with an orthopedic provider such as [facility] ([place] or ) or Dr. Joseph Berkshire for further evaluation.  If medication was prescribed, stop by the pharmacy to pick up your prescriptions.  For your  pain, Take 1500 mg Tylenol twice a day, take muscle relaxer (Flexeril) twice a day, take Naprosyn twice a day,  as needed for pain. Rest and elevate the affected painful area.  Apply warm compresses intermittently, as needed.  As pain recedes, begin normal activities slowly as tolerated.         ED Prescriptions     Medication Sig Dispense Auth. Provider   cyclobenzaprine (FLEXERIL) 10 MG tablet Take 1 tablet (10 mg total) by mouth 2 (two)  times daily as needed for muscle  spasms. 20 tablet Euleta Belson, DO   naproxen (NAPROSYN) 500 MG tablet Take 1 tablet (500 mg total) by mouth 2 (two) times daily with a meal. 30 tablet Ermine Spofford, DO      PDMP not reviewed this encounter.   Katha Cabal, DO 06/24/23 1041

## 2023-07-15 ENCOUNTER — Other Ambulatory Visit: Payer: Self-pay | Admitting: Family Medicine

## 2023-07-15 DIAGNOSIS — I10 Essential (primary) hypertension: Secondary | ICD-10-CM

## 2023-12-12 LAB — HM MAMMOGRAPHY

## 2024-02-19 ENCOUNTER — Encounter: Payer: Self-pay | Admitting: *Deleted

## 2024-02-26 ENCOUNTER — Other Ambulatory Visit (HOSPITAL_COMMUNITY): Payer: Self-pay

## 2024-03-15 ENCOUNTER — Other Ambulatory Visit: Payer: Self-pay | Admitting: Family Medicine

## 2024-03-15 DIAGNOSIS — E785 Hyperlipidemia, unspecified: Secondary | ICD-10-CM

## 2024-03-15 NOTE — Telephone Encounter (Signed)
 Requested Prescriptions  Refused Prescriptions Disp Refills   atorvastatin  (LIPITOR) 20 MG tablet [Pharmacy Med Name: ATORVASTATIN  20 MG TABLET] 90 tablet 2    Sig: TAKE 1 TABLET BY MOUTH EVERY DAY     Cardiovascular:  Antilipid - Statins Failed - 03/15/2024  3:48 PM      Failed - Valid encounter within last 12 months    Recent Outpatient Visits   None            Failed - Lipid Panel in normal range within the last 12 months    Cholesterol  Date Value Ref Range Status  02/07/2023 233 (H) <200 mg/dL Final  95/87/7986 853 0 - 200 mg/dL Final   Ldl Cholesterol, Calc  Date Value Ref Range Status  12/30/2011 90 0 - 100 mg/dL Final   LDL Cholesterol (Calc)  Date Value Ref Range Status  02/07/2023 165 (H) mg/dL (calc) Final    Comment:    Reference range: <100 . Desirable range <100 mg/dL for primary prevention;   <70 mg/dL for patients with CHD or diabetic patients  with > or = 2 CHD risk factors. SABRA LDL-C is now calculated using the Martin-Hopkins  calculation, which is a validated novel method providing  better accuracy than the Friedewald equation in the  estimation of LDL-C.  Gladis APPLETHWAITE et al. SANDREA. 7986;689(80): 2061-2068  (http://education.[employer].com/faq/FAQ164)    HDL Cholesterol  Date Value Ref Range Status  12/30/2011 39 (L) 40 - 60 mg/dL Final   HDL  Date Value Ref Range Status  02/07/2023 45 (L) > OR = 50 mg/dL Final   Triglycerides  Date Value Ref Range Status  02/07/2023 110 <150 mg/dL Final  95/87/7986 86 0 - 200 mg/dL Final         Passed - Patient is not pregnant

## 2024-08-19 ENCOUNTER — Ambulatory Visit: Admission: EM | Admit: 2024-08-19 | Discharge: 2024-08-19 | Attending: Family Medicine | Admitting: Family Medicine

## 2024-08-19 ENCOUNTER — Encounter: Payer: Self-pay | Admitting: Emergency Medicine

## 2024-08-19 DIAGNOSIS — R519 Headache, unspecified: Secondary | ICD-10-CM | POA: Diagnosis not present

## 2024-08-19 DIAGNOSIS — S0990XA Unspecified injury of head, initial encounter: Secondary | ICD-10-CM

## 2024-08-19 NOTE — ED Triage Notes (Signed)
 Pt hit her head on her open trunk yesterday afternoon. She has a small knot on her forehead and her left eye feels heavy. She has not taken anything for the pain.

## 2024-08-19 NOTE — ED Notes (Signed)
 Patient is being discharged from the Urgent Care and sent to the Emergency Department via POV . Per Myla Bold NP, patient is in need of higher level of care due to Head Injury. Patient is aware and verbalizes understanding of plan of care.  Vitals:   08/19/24 1333  BP: 125/84  Pulse: 75  Resp: 16  Temp: 98.3 F (36.8 C)  SpO2: 97%

## 2024-08-19 NOTE — Discharge Instructions (Addendum)
 Please go to the emergency room for further evaluation of your head injury.

## 2024-08-19 NOTE — ED Provider Notes (Signed)
 [facility]    CSN: [id number] Arrival date & time: 08/19/24  1259      History   Chief Complaint Chief Complaint  Patient presents with   Head Injury    HPI Tiffany Nunez is a 53 y.o. female presents for head injury.  Patient reports yesterday while she was putting something into the car she hit her forehead on the trunk.  This was unwitnessed but she denies LOC.  States she had immediate swelling or goose egg to her forehead as well as an immediate onset of a headache that has persisted since then.  Headache is left-sided and radiates into her eye.  She states when she bends over she feels a lot of pressure in her forehead and her eye.  She currently rates her headache as a 6 out of 10 and denies worst headache of life.  She does endorse a history of migraines but states this does not feel like her typical migraine.  She is not on blood thinning medications.  Denies photophobia, nausea vomiting or visual changes.  Intermittent dizziness.  No unilateral weakness.  No neck pain.  She has not taken any OTC treatments for her symptoms since onset.  No other concerns at this time.   Head Injury Associated symptoms: headache     Past Medical History:  Diagnosis Date   Anemia    Depressive disorder    Diabetes mellitus, type II (HCC)    Esophageal reflux    Essential hypertension, benign    Genital herpes, unspecified    Helicobacter pylori (H. pylori)    Metabolic syndrome    Migraine, unspecified, without mention of intractable migraine without mention of status migrainosus    Nonspecific abnormal electrocardiogram (ECG) (EKG)    Nonspecific abnormal results of thyroid  function study    Obesity, unspecified    Other and unspecified hyperlipidemia    Unspecified vitamin D  deficiency     Patient Active Problem List   Diagnosis Date Noted   Hypertension associated with type 2 diabetes mellitus (HCC) 03/18/2021   Mild episode of recurrent major depressive  disorder 05/28/2018   History of cerebrovascular accident (CVA) involving cerebellum 06/12/2017   Acquired autoimmune hypothyroidism 11/09/2016   Vitamin D  deficiency 05/03/2015   Dyslipidemia 05/03/2015   Gastro-esophageal reflux disease without esophagitis 05/03/2015   Alopecia 05/03/2015   Genital herpes 05/03/2015   Dysmetabolic syndrome 05/03/2015   Positive H. pylori test 05/03/2015   Neuralgia neuritis, sciatic nerve 05/03/2015   Migraine without aura and responsive to treatment 04/30/2014   HTN (hypertension) 01/22/2014   Morbid obesity (HCC) 01/22/2014   Type 2 diabetes mellitus with obesity 01/22/2014   Decreased cardiac ejection fraction 01/22/2014    Past Surgical History:  Procedure Laterality Date   ABDOMINAL HYSTERECTOMY     ANKLE SURGERY     right   TUBAL LIGATION      OB History   No obstetric history on file.      Home Medications    Prior to Admission medications   Medication Sig Start Date End Date Taking? Authorizing Provider  ALPRAZolam (XANAX) 0.5 MG tablet Take 0.5 mg by mouth 2 (two) times daily. 08/06/24  Yes [provider]  amitriptyline (ELAVIL) 50 MG tablet Take 50 mg by mouth daily. 08/12/24  Yes [provider]  hydrOXYzine (VISTARIL) 25 MG capsule Take 25 mg by mouth 3 (three) times daily. 08/12/24  Yes [provider]  Accu-Chek Softclix Lancets lancets SMARTSIG:Topical 02/27/23  [provider]  atorvastatin  (LIPITOR) 20 MG tablet Take 1 tablet (20 mg total) by mouth daily. 02/08/23   Mecum, Erin E, PA-C  Blood Glucose Monitoring Suppl (ACCU-CHEK GUIDE) w/Device KIT USE TO CHECK BLOOD GLUCOSE IN THE MORNING, AT NOON, AND AT BEDTIME 02/27/23   [provider]  Blood Glucose Monitoring Suppl DEVI 1 each by Does not apply route in the morning, at noon, and at bedtime. May substitute to any manufacturer covered by patient's insurance. 02/27/23   Sowles, Krichna, MD  cyclobenzaprine  (FLEXERIL ) 10 MG  tablet Take 1 tablet (10 mg total) by mouth 2 (two) times daily as needed for muscle spasms. 06/24/23   Brimage, Vondra, DO  dapagliflozin  propanediol (FARXIGA ) 10 MG TABS tablet Take 1 tablet (10 mg total) by mouth daily. 03/07/23   Sowles, Krichna, MD  glucose blood (ACCU-CHEK GUIDE) test strip USE TO TEST IN THE MORNING, AT NOON, AND AT BEDTIME 03/27/23   Sowles, Krichna, MD  hydrALAZINE  (APRESOLINE ) 25 MG tablet TAKE 1 TABLET BY MOUTH TWICE A DAY AS NEEDED 04/14/23   Sowles, Krichna, MD  naproxen  (NAPROSYN ) 500 MG tablet Take 1 tablet (500 mg total) by mouth 2 (two) times daily with a meal. 06/24/23   Brimage, Vondra, DO  NOVOFINE AUTOCOVER PEN NEEDLE 30G X 8 MM MISC INJECT 1 EACH INTO THE SKIN AS NEEDED. AS DIRECTED 12/12/20   Sowles, Krichna, MD  Olmesartan -amLODIPine -HCTZ 40-10-25 MG TABS Take 1 tablet by mouth daily. 02/14/23 05/15/23  Mecum, Erin E, PA-C  pioglitazone  (ACTOS ) 15 MG tablet Take 1 tablet (15 mg total) by mouth daily. 04/18/23   Sowles, Krichna, MD  Rimegepant Sulfate (NURTEC) 75 MG TBDP Take 1 tablet (75 mg total) by mouth daily as needed. 03/07/23   Sowles, Krichna, MD  Vitamin D , Ergocalciferol , (DRISDOL ) 1.25 MG (50000 UNIT) CAPS capsule Take 1 capsule (50,000 Units total) by mouth every 7 (seven) days. 02/08/23   Mecum, Rocky BRAVO, PA-C    Family History Family History  Problem Relation Age of Onset   Hypertension Father    Hyperlipidemia Father    Hyperlipidemia Mother    Hypertension Mother    Hypertension Paternal Aunt    Cancer Maternal Grandmother        breast    Social History Social History   Tobacco Use   Smoking status: Never   Smokeless tobacco: Never  Vaping Use   Vaping status: Never Used  Substance Use Topics   Alcohol use: No    Alcohol/week: 0.0 standard drinks of alcohol   Drug use: No     Allergies   Imitrex [sumatriptan], Topamax [topiramate], and Ubrogepant    Review of Systems Review of Systems  Neurological:  Positive for dizziness and  headaches.       Head injury     Physical Exam Triage Vital Signs ED Triage Vitals  Encounter Vitals Group     BP 08/19/24 1333 125/84     Girls Systolic BP Percentile --      Girls Diastolic BP Percentile --      Boys Systolic BP Percentile --      Boys Diastolic BP Percentile --      Pulse Rate 08/19/24 1333 75     Resp 08/19/24 1333 16     Temp 08/19/24 1333 98.3 F (36.8 C)     Temp Source 08/19/24 1333 Oral     SpO2 08/19/24 1333 97 %     Weight 08/19/24 1332 256 lb (116.1 kg)  Height --      Head Circumference --      Peak Flow --      Pain Score 08/19/24 1331 6     Pain Loc --      Pain Education --      Exclude from Growth Chart --    No data found.  Updated Vital Signs BP 125/84 (BP Location: Right Arm)   Pulse 75   Temp 98.3 F (36.8 C) (Oral)   Resp 16   Wt 256 lb (116.1 kg)   SpO2 97%   BMI 41.32 kg/m   Visual Acuity Right Eye Distance:   Left Eye Distance:   Bilateral Distance:    Right Eye Near:   Left Eye Near:    Bilateral Near:     Physical Exam Vitals and nursing note reviewed.  Constitutional:      General: She is not in acute distress.    Appearance: Normal appearance. She is not ill-appearing.  HENT:     Head: Normocephalic and atraumatic. No raccoon eyes, Battle's sign or laceration.      Comments: Small hematoma to the left upper forehead with tenderness with palpation.  No skull depression    Right Ear: Tympanic membrane and ear canal normal. No hemotympanum.     Left Ear: Tympanic membrane and ear canal normal. No hemotympanum.  Neurological:     Mental Status: She is alert.     GCS: GCS eye subscore is 4. GCS verbal subscore is 5. GCS motor subscore is 6.     Cranial Nerves: No facial asymmetry.     Motor: No weakness or pronator drift.     Coordination: Romberg sign positive. Finger-Nose-Finger Test normal.     Gait: Tandem walk abnormal.      UC Treatments / Results  Labs (all labs ordered are listed, but only  abnormal results are displayed) Labs Reviewed - No data to display  EKG   Radiology No results found.  Procedures Procedures (including critical care time)  Medications Ordered in UC Medications - No data to display  Initial Impression / Assessment and Plan / UC Course  I have reviewed the triage vital signs and the nursing notes.  Pertinent labs & imaging results that were available during my care of the patient were reviewed by me and considered in my medical decision making (see chart for details).     Reviewed exam and symptoms with patient.  Discussed likely concussion but given her neuroexam, pressure to her forehead and only when she leans over, intermittent dizziness advised ER evaluation to rule out any intracranial causes of her symptoms given her injury.  She is in agreement with plan will go POV with family to the emergency room. Final Clinical Impressions(s) / UC Diagnoses   Final diagnoses:  Injury of head, initial encounter  Acute intractable headache, unspecified headache type     Discharge Instructions      Please go to the emergency room for further evaluation of your head injury    ED Prescriptions   None    PDMP not reviewed this encounter.   Loreda Myla SAUNDERS, NP 08/19/24 1359

## 2024-08-22 ENCOUNTER — Emergency Department
Admission: EM | Admit: 2024-08-22 | Discharge: 2024-08-22 | Disposition: A | Discharge status: Home / Self Care | Attending: Emergency Medicine | Admitting: Emergency Medicine

## 2024-08-22 ENCOUNTER — Other Ambulatory Visit: Payer: Self-pay

## 2024-08-22 ENCOUNTER — Emergency Department

## 2024-08-22 DIAGNOSIS — I10 Essential (primary) hypertension: Secondary | ICD-10-CM | POA: Insufficient documentation

## 2024-08-22 DIAGNOSIS — S0990XA Unspecified injury of head, initial encounter: Secondary | ICD-10-CM | POA: Insufficient documentation

## 2024-08-22 DIAGNOSIS — W228XXA Striking against or struck by other objects, initial encounter: Secondary | ICD-10-CM | POA: Diagnosis not present

## 2024-08-22 DIAGNOSIS — E119 Type 2 diabetes mellitus without complications: Secondary | ICD-10-CM | POA: Insufficient documentation

## 2024-08-22 NOTE — ED Triage Notes (Signed)
 Pt to ED via POV from home. Pt reports Sunday hit her head on an open trunk. Pt reports continued head pain and pressure around left eye.

## 2024-08-22 NOTE — Discharge Instructions (Signed)
 The CT scan of your head was normal.  You can take 650 mg of Tylenol  and 600 mg of ibuprofen  every 6 hours as needed for pain. You can use ice, heat, muscle creams and other topical pain relievers as well.  Based on your symptoms today you may have a concussion. This is a clinical diagnosis which means there is no diagnostic test to confirm this. Symptoms of a concussion include: headache, dizziness, insomnia, fatigue, uneven gait, nausea, vomiting, blurred vision and difficulty concentrating. These symptoms may develop and change overtime. Most people have symptom resolution in 7-10 days but it may take up to two weeks to a month. If you are still having symptoms after 2 weeks please be evaluated by another healthcare provider. This could be your primary care provider, urgent care or the emergency department. You should take it easy for the 24-48 hours.  Avoid activities that require a lot of thinking or physical effort.  After a few days gradually return to normal activities as long as they do not make symptoms worse.  If your symptoms return or get worse, slow down and rest more.  Avoid activities that could lead to another head injury until cleared by a healthcare provider.  Do not drive, ride a bike or operate heavy machinery until you feel fully alert. Return to the ED if you have repeat vomiting, severe or worsening headache that does not go away with pain medication, trouble waking up, staying awake or unusual drowsiness, slurred speech or trouble speaking, weakness/numbness or trouble moving arms or legs, seizure, unequal pupil sizes or clear fluid or blood coming from the nose or ears.

## 2024-08-22 NOTE — ED Provider Notes (Signed)
 [facility] Provider Note    Event Date/Time   First MD Initiated Contact with Patient 08/22/24 [phone]     (approximate)   History   Head Injury   HPI  Tiffany Nunez is a 53 y.o. female with PMH of diabetes, hypertension, metabolic syndrome, obesity, anemia who presents for evaluation of a head injury.  Patient hit her head on the trunk of her car on "Sunday.  Immediately had an area that swelled up.  Patient was seen in urgent care on Monday and was recommended to come to the emergency department for further evaluation.  Patient's daughter just had a baby and so she has not been able to get into the ER until today.  She reports some ongoing mild pain of her head that radiates to her left eye but no visual changes.  At the time of the head injury there was no loss of consciousness or vomiting.      Physical Exam   Triage Vital Signs: ED Triage Vitals  Encounter Vitals Group     BP 08/22/24 0912 (!) 147/91     Girls Systolic BP Percentile --      Girls Diastolic BP Percentile --      Boys Systolic BP Percentile --      Boys Diastolic BP Percentile --      Pulse Rate 08/22/24 0912 82     Resp 08/22/24 0912 16     Temp 08/22/24 0912 98.6 F (37 C)     Temp Source 08/22/24 0912 Oral     SpO2 08/22/24 0912 98 %     Weight --      Height --      Head Circumference --      Peak Flow --      Pain Score 08/22/24 0910 5     Pain Loc --      Pain Education --      Exclude from Growth Chart --     Most recent vital signs: Vitals:   08/22/24 0912  BP: (!) 147/91  Pulse: 82  Resp: 16  Temp: 98.6 F (37 C)  SpO2: 98%   General: Awake, no distress.  CV:  Good peripheral perfusion.  RRR. Resp:  Normal effort.  CTAB Abd:  No distention.  Other:  PERRL, EOM intact, no ataxia on finger-to-nose, no pronator drift, symmetric facial movements.   ED Results / Procedures / Treatments   Labs (all labs ordered are listed, but only abnormal results are  displayed) Labs Reviewed - No data to display   RADIOLOGY  CT head obtained, interpreted the images as well as reviewed the radiologist report which was negative for acute abnormalities aside from some mild soft tissue swelling.   PROCEDURES:  Critical Care performed: No  Procedures   MEDICATIONS ORDERED IN ED: Medications - No data to display   IMPRESSION / MDM / ASSESSMENT AND PLAN / ED COURSE  I reviewed the triage vital signs and the nursing notes.                             53"  year old female presents for evaluation of a head injury.  Vital signs are stable aside from patient's elevated blood pressure.  Patient NAD on exam.  Differential diagnosis includes, but is not limited to, contusion, tension headache, concussion, closed head injury, intracranial bleed, skull fracture.  Patient's presentation is most consistent with acute complicated illness /  injury requiring diagnostic workup.  CT of the head was negative for intracranial bleed and skull fracture.  Physical exam is reassuring, no neurodeficits.  Patient may have a concussion given her ongoing symptoms.  She also reports not being able to rest much as her daughter just had a baby and has needed a lot of support from her I suspect is contributing to her symptoms.  Patient advised to take Tylenol  and ibuprofen  as needed.  Discussed signs and symptoms of a concussion to watch for and when to return to the emergency department.  Patient voiced understanding, all questions were answered and she is stable at discharge.     FINAL CLINICAL IMPRESSION(S) / ED DIAGNOSES   Final diagnoses:  Injury of head, initial encounter     Rx / DC Orders   ED Discharge Orders     None        Note:  This document was prepared using Dragon voice recognition software and may include unintentional dictation errors.   Cleaster Tinnie LABOR, PA-C 08/22/24 1104    Levander Slate, MD 08/23/24 [phone]
